# Patient Record
Sex: Male | Born: 1980 | Race: White | Hispanic: No | Smoking: Never smoker
Health system: Southern US, Community
[De-identification: ages and names within clinical notes are randomized; demographics above are authoritative.]

## PROBLEM LIST (undated history)

## (undated) DIAGNOSIS — I10 Essential (primary) hypertension: Secondary | ICD-10-CM

## (undated) DIAGNOSIS — D689 Coagulation defect, unspecified: Secondary | ICD-10-CM

## (undated) DIAGNOSIS — E785 Hyperlipidemia, unspecified: Secondary | ICD-10-CM

## (undated) DIAGNOSIS — F419 Anxiety disorder, unspecified: Secondary | ICD-10-CM

## (undated) DIAGNOSIS — F32A Depression, unspecified: Secondary | ICD-10-CM

## (undated) DIAGNOSIS — R188 Other ascites: Secondary | ICD-10-CM

## (undated) DIAGNOSIS — F1910 Other psychoactive substance abuse, uncomplicated: Secondary | ICD-10-CM

## (undated) HISTORY — DX: Hyperlipidemia, unspecified: E78.5

## (undated) HISTORY — DX: Coagulation defect, unspecified: D68.9

## (undated) HISTORY — DX: Depression, unspecified: F32.A

## (undated) HISTORY — DX: Anxiety disorder, unspecified: F41.9

## (undated) HISTORY — PX: KNEE SURGERY: SHX244

## (undated) HISTORY — DX: Other psychoactive substance abuse, uncomplicated: F19.10

---
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
## 2005-08-19 ENCOUNTER — Emergency Department (HOSPITAL_COMMUNITY): Admission: EM | Admit: 2005-08-19 | Discharge: 2005-08-19 | Payer: Self-pay | Admitting: Emergency Medicine

## 2012-12-04 DIAGNOSIS — E785 Hyperlipidemia, unspecified: Secondary | ICD-10-CM | POA: Insufficient documentation

## 2012-12-04 HISTORY — DX: Hyperlipidemia, unspecified: E78.5

## 2013-08-31 ENCOUNTER — Emergency Department (INDEPENDENT_AMBULATORY_CARE_PROVIDER_SITE_OTHER): Payer: BC Managed Care – PPO

## 2013-08-31 ENCOUNTER — Encounter (HOSPITAL_COMMUNITY): Payer: Self-pay | Admitting: Emergency Medicine

## 2013-08-31 ENCOUNTER — Emergency Department (HOSPITAL_COMMUNITY)
Admission: EM | Admit: 2013-08-31 | Discharge: 2013-08-31 | Disposition: A | Discharge status: Home / Self Care | Payer: BC Managed Care – PPO | Source: Home / Self Care

## 2013-08-31 DIAGNOSIS — S46911A Strain of unspecified muscle, fascia and tendon at shoulder and upper arm level, right arm, initial encounter: Secondary | ICD-10-CM

## 2013-08-31 DIAGNOSIS — IMO0002 Reserved for concepts with insufficient information to code with codable children: Secondary | ICD-10-CM

## 2013-08-31 DIAGNOSIS — S86911A Strain of unspecified muscle(s) and tendon(s) at lower leg level, right leg, initial encounter: Secondary | ICD-10-CM

## 2013-08-31 MED ORDER — ONDANSETRON 4 MG PO TBDP
ORAL_TABLET | ORAL | Status: AC
  Filled 2013-08-31: qty 2

## 2013-08-31 MED ORDER — TETANUS-DIPHTH-ACELL PERTUSSIS 5-2.5-18.5 LF-MCG/0.5 IM SUSP
0.5000 mL | Freq: Once | INTRAMUSCULAR | Status: AC
  Administered 2013-08-31: 0.5 mL via INTRAMUSCULAR

## 2013-08-31 MED ORDER — HYDROCODONE-ACETAMINOPHEN 5-325 MG PO TABS: 1.0000 | ORAL_TABLET | Freq: Four times a day (QID) | ORAL | Status: DC | PRN

## 2013-08-31 MED ORDER — HYDROMORPHONE HCL 1 MG/ML IJ SOLN
2.0000 mg | Freq: Once | INTRAMUSCULAR | Status: AC
  Administered 2013-08-31: 2 mg via INTRAMUSCULAR

## 2013-08-31 MED ORDER — HYDROMORPHONE HCL PF 1 MG/ML IJ SOLN
INTRAMUSCULAR | Status: AC
  Filled 2013-08-31: qty 2

## 2013-08-31 MED ORDER — TETANUS-DIPHTH-ACELL PERTUSSIS 5-2.5-18.5 LF-MCG/0.5 IM SUSP
INTRAMUSCULAR | Status: AC
  Filled 2013-08-31: qty 0.5

## 2013-08-31 MED ORDER — ONDANSETRON 4 MG PO TBDP
8.0000 mg | ORAL_TABLET | Freq: Once | ORAL | Status: AC
  Administered 2013-08-31: 8 mg via ORAL

## 2013-08-31 NOTE — Discharge Instructions (Signed)
Wear splints for comfort as needed, use medicine as needed, see dr Thurston Holewainer on mon at 2pm for recheck.

## 2013-08-31 NOTE — ED Notes (Signed)
Pt fell  Off  The  Top   Of an  8  Foot  Ladder         He  Has  Pain in the  r  Knee  And  r    Shoulder              Has  A  History     Of  Knee  Surgery          In past        rom  Is  Limited  In  Both  Of the  Areas            He  Hid  Not  Hit  His  Head  He  Did  not  Pass  Out

## 2013-08-31 NOTE — ED Provider Notes (Signed)
CSN: [id number]     Arrival date & time 08/31/13  1233 History   None    Chief Complaint  Patient presents with  . Fall   (Consider location/radiation/quality/duration/timing/severity/associated sxs/prior Treatment) Patient is a 33 y.o. male presenting with fall. The history is provided by the patient.  Fall This is a new problem. The current episode started 1 to 2 hours ago (fell approx 8 ft from ladder landing on right leg twisting knee then falling onto right shoulder. no loc , no other injuries.). The problem has not changed since onset.Pertinent negatives include no headaches.    History reviewed. No pertinent past medical history. Past Surgical History  Procedure Laterality Date  . Knee surgery     History reviewed. No pertinent family history. History  Substance Use Topics  . Smoking status: Never Smoker   . Smokeless tobacco: Not on file  . Alcohol Use: Yes    Review of Systems  Constitutional: Negative.   Musculoskeletal: Positive for gait problem and joint swelling. Negative for back pain and neck pain.  Skin: Negative.   Neurological: Negative for headaches.    Allergies  Review of patient's allergies indicates not on file.  Home Medications   Prior to Admission medications   Medication Sig Start Date End Date Taking? Authorizing Provider  HYDROcodone-acetaminophen (NORCO/VICODIN) 5-325 MG per tablet Take 1-2 tablets by mouth every 6 (six) hours as needed for moderate pain or severe pain. 08/31/13   Linna HoffJames D Maritta Kief, MD   BP 157/97  Pulse 105  Temp(Src) 98.9 F (37.2 C) (Oral)  Resp 16  SpO2 96% Physical Exam  Nursing note and vitals reviewed. Constitutional: He is oriented to person, place, and time. He appears well-developed and well-nourished.  HENT:  Head: Normocephalic and atraumatic.  Neck: Normal range of motion. Neck supple.  Pulmonary/Chest: He exhibits no tenderness.  Abdominal: There is no tenderness.  Musculoskeletal: He exhibits tenderness.         Right shoulder: He exhibits tenderness, pain and decreased strength. He exhibits no effusion, no crepitus, no deformity and normal pulse.       Right knee: He exhibits decreased range of motion, effusion and bony tenderness. He exhibits no ecchymosis, normal alignment and normal patellar mobility. Tenderness found. Lateral joint line tenderness noted. No patellar tendon tenderness noted.       Legs: Neurological: He is alert and oriented to person, place, and time.  Skin: Skin is warm and dry.    ED Course  Procedures (including critical care time) Labs Review Labs Reviewed - No data to display  Imaging Review Dg Shoulder Right  08/31/2013   CLINICAL DATA:  Recent traumatic injury with pain  EXAM: RIGHT SHOULDER - 2+ VIEW  COMPARISON:  None.  FINDINGS: There is no evidence of fracture or dislocation. There is no evidence of arthropathy or other focal bone abnormality. Soft tissues are unremarkable.  IMPRESSION: No acute abnormality noted.   Electronically Signed   By: Alcide CleverMark  Lukens M.D.   On: 08/31/2013 14:33   Dg Knee Complete 4 Views Right  08/31/2013   CLINICAL DATA:  Larey SeatFell off ladder striking RIGHT shoulder in landing on RIGHT foot, twisting RIGHT knee, extreme pain RIGHT knee, unable to bear weight, prior injury and surgery  EXAM: RIGHT KNEE - COMPLETE 4+ VIEW  COMPARISON:  None  FINDINGS: Osseous mineralization normal.  Postsurgical changes identified from prior ACL reconstruction.  Minimal joint space narrowing at lateral compartment.  No acute fracture, dislocation, or bone destruction.  No knee joint effusion.  IMPRESSION: No acute osseous abnormalities.  Postsurgical and mild degenerative changes RIGHT knee.   Electronically Signed   By: Ulyses Southward M.D.   On: 08/31/2013 14:54     MDM   1. Strain of right knee and leg, initial encounter   2. Strain of right shoulder, initial encounter        Linna Hoff, MD 08/31/13 [phone]

## 2015-01-17 ENCOUNTER — Emergency Department (HOSPITAL_COMMUNITY)
Admission: EM | Admit: 2015-01-17 | Discharge: 2015-01-17 | Disposition: A | Discharge status: Home / Self Care | Payer: BLUE CROSS/BLUE SHIELD | Source: Home / Self Care | Attending: Family Medicine | Admitting: Family Medicine

## 2015-01-17 ENCOUNTER — Encounter (HOSPITAL_COMMUNITY): Payer: Self-pay | Admitting: Emergency Medicine

## 2015-01-17 DIAGNOSIS — J069 Acute upper respiratory infection, unspecified: Secondary | ICD-10-CM

## 2015-01-17 DIAGNOSIS — J3489 Other specified disorders of nose and nasal sinuses: Secondary | ICD-10-CM

## 2015-01-17 DIAGNOSIS — R05 Cough: Secondary | ICD-10-CM | POA: Diagnosis not present

## 2015-01-17 DIAGNOSIS — R059 Cough, unspecified: Secondary | ICD-10-CM

## 2015-01-17 MED ORDER — IPRATROPIUM BROMIDE 0.06 % NA SOLN: 2.0000 | Freq: Four times a day (QID) | NASAL | Status: DC

## 2015-01-17 NOTE — Discharge Instructions (Signed)
Upper Respiratory Infection, Adult For nasal congestion take Sudafed PE 10 mg every 4 hours as needed For drainage recommend Allegra 180 mg a day or Zyrtec 10 mg or Claritin 10 mg a day. For cough Robitussin-DM as directed Use a copious amount of saline nasal spray frequently Ibuprofen 600 mg every 6-8 hours as needed for discomfort or fever. Atrovent nasal spray as directed for runny nose. Most upper respiratory infections (URIs) are a viral infection of the air passages leading to the lungs. A URI affects the nose, throat, and upper air passages. The most common type of URI is nasopharyngitis and is typically referred to as "the common cold." URIs run their course and usually go away on their own. Most of the time, a URI does not require medical attention, but sometimes a bacterial infection in the upper airways can follow a viral infection. This is called a secondary infection. Sinus and middle ear infections are common types of secondary upper respiratory infections. Bacterial pneumonia can also complicate a URI. A URI can worsen asthma and chronic obstructive pulmonary disease (COPD). Sometimes, these complications can require emergency medical care and may be life threatening.  CAUSES Almost all URIs are caused by viruses. A virus is a type of germ and can spread from one person to another.  RISKS FACTORS You may be at risk for a URI if:   You smoke.   You have chronic heart or lung disease.  You have a weakened defense (immune) system.   You are very young or very old.   You have nasal allergies or asthma.  You work in crowded or poorly ventilated areas.  You work in health care facilities or schools. SIGNS AND SYMPTOMS  Symptoms typically develop 2-3 days after you come in contact with a cold virus. Most viral URIs last 7-10 days. However, viral URIs from the influenza virus (flu virus) can last 14-18 days and are typically more severe. Symptoms may include:   Runny or  stuffy (congested) nose.   Sneezing.   Cough.   Sore throat.   Headache.   Fatigue.   Fever.   Loss of appetite.   Pain in your forehead, behind your eyes, and over your cheekbones (sinus pain).  Muscle aches.  DIAGNOSIS  Your health care provider may diagnose a URI by:  Physical exam.  Tests to check that your symptoms are not due to another condition such as:  Strep throat.  Sinusitis.  Pneumonia.  Asthma. TREATMENT  A URI goes away on its own with time. It cannot be cured with medicines, but medicines may be prescribed or recommended to relieve symptoms. Medicines may help:  Reduce your fever.  Reduce your cough.  Relieve nasal congestion. HOME CARE INSTRUCTIONS   Take medicines only as directed by your health care provider.   Gargle warm saltwater or take cough drops to comfort your throat as directed by your health care provider.  Use a warm mist humidifier or inhale steam from a shower to increase air moisture. This may make it easier to breathe.  Drink enough fluid to keep your urine clear or pale yellow.   Eat soups and other clear broths and maintain good nutrition.   Rest as needed.   Return to work when your temperature has returned to normal or as your health care provider advises. You may need to stay home longer to avoid infecting others. You can also use a face mask and careful hand washing to prevent spread of the  virus.  Increase the usage of your inhaler if you have asthma.   Do not use any tobacco products, including cigarettes, chewing tobacco, or electronic cigarettes. If you need help quitting, ask your health care provider. PREVENTION  The best way to protect yourself from getting a cold is to practice good hygiene.   Avoid oral or hand contact with people with cold symptoms.   Wash your hands often if contact occurs.  There is no clear evidence that vitamin C, vitamin E, echinacea, or exercise reduces the chance  of developing a cold. However, it is always recommended to get plenty of rest, exercise, and practice good nutrition.  SEEK MEDICAL CARE IF:   You are getting worse rather than better.   Your symptoms are not controlled by medicine.   You have chills.  You have worsening shortness of breath.  You have brown or red mucus.  You have yellow or brown nasal discharge.  You have pain in your face, especially when you bend forward.  You have a fever.  You have swollen neck glands.  You have pain while swallowing.  You have white areas in the back of your throat. SEEK IMMEDIATE MEDICAL CARE IF:   You have severe or persistent:  Headache.  Ear pain.  Sinus pain.  Chest pain.  You have chronic lung disease and any of the following:  Wheezing.  Prolonged cough.  Coughing up blood.  A change in your usual mucus.  You have a stiff neck.  You have changes in your:  Vision.  Hearing.  Thinking.  Mood. MAKE SURE YOU:   Understand these instructions.  Will watch your condition.  Will get help right away if you are not doing well or get worse.   This information is not intended to replace advice given to you by your health care provider. Make sure you discuss any questions you have with your health care provider.   Document Released: 07/14/2000 Document Revised: 06/04/2014 Document Reviewed: 04/25/2013 Elsevier Interactive Patient Education [employer]2016 Elsevier Inc.

## 2015-01-17 NOTE — ED Provider Notes (Signed)
CSN: [id number]     Arrival date & time 01/17/15  1310 History   First MD Initiated Contact with Patient 01/17/15 1342     Chief Complaint  Patient presents with  . URI   (Consider location/radiation/quality/duration/timing/severity/associated sxs/prior Treatment) HPI Comments: 34 year old male complaining of URI symptoms. He is complaining of a cough, sinus drainage, runny nose, nasal congestion, morning "goopy eyes". Denies fever or PND. Denies shortness of breath. When coughing has facial pressure.  Patient is a 34 y.o. male presenting with URI.  URI Presenting symptoms: congestion, cough and rhinorrhea   Presenting symptoms: no ear pain, no fever and no sore throat   Severity:  Moderate Onset quality:  Gradual Duration:  10 days Timing:  Constant Progression:  Unchanged Chronicity:  New Relieved by:  Nothing Associated symptoms: headaches, sinus pain and sneezing   Associated symptoms: no swollen glands and no wheezing     History reviewed. No pertinent past medical history. Past Surgical History  Procedure Laterality Date  . Knee surgery     No family history on file. Social History  Substance Use Topics  . Smoking status: Never Smoker   . Smokeless tobacco: None  . Alcohol Use: Yes    Review of Systems  Constitutional: Positive for activity change. Negative for fever and chills.  HENT: Positive for congestion, ear discharge, rhinorrhea, sinus pressure and sneezing. Negative for ear pain, postnasal drip and sore throat.   Eyes: Positive for discharge.  Respiratory: Positive for cough. Negative for shortness of breath and wheezing.   Cardiovascular: Negative.   Skin: Negative.   Neurological: Positive for headaches.  All other systems reviewed and are negative.   Allergies  Review of patient's allergies indicates no known allergies.  Home Medications   Prior to Admission medications   Medication Sig Start Date End Date Taking? Authorizing Provider   HYDROcodone-acetaminophen (NORCO/VICODIN) 5-325 MG per tablet Take 1-2 tablets by mouth every 6 (six) hours as needed for moderate pain or severe pain. 08/31/13   Linna HoffJames D Kindl, MD  ipratropium (ATROVENT) 0.06 % nasal spray Place 2 sprays into both nostrils 4 (four) times daily. 01/17/15   Hayden Rasmussenavid Trey Gulbranson, NP   Meds Ordered and Administered this Visit  Medications - No data to display  BP 151/98 mmHg  Pulse 102  Temp(Src) 98.6 F (37 C) (Oral) No data found.   Physical Exam  Constitutional: He is oriented to person, place, and time. He appears well-developed and well-nourished. No distress.  HENT:  Mouth/Throat: No oropharyngeal exudate.  Bilateral TMs are retracted. No erythema. Oropharynx erythematous without swelling or exudates. Positive for cobblestoning and clear PND.  Eyes: Conjunctivae and EOM are normal.  Neck: Normal range of motion. Neck supple.  Cardiovascular: Normal rate, regular rhythm and normal heart sounds.   Pulmonary/Chest: Effort normal and breath sounds normal. No respiratory distress. He has no wheezes. He has no rales.  Musculoskeletal: Normal range of motion. He exhibits no edema.  Lymphadenopathy:    He has no cervical adenopathy.  Neurological: He is alert and oriented to person, place, and time.  Skin: Skin is warm and dry. No rash noted.  Psychiatric: He has a normal mood and affect.  Nursing note and vitals reviewed.   ED Course  Procedures (including critical care time)  Labs Review Labs Reviewed - No data to display  Imaging Review No results found.   Visual Acuity Review  Right Eye Distance:   Left Eye Distance:   Bilateral Distance:  Right Eye Near:   Left Eye Near:    Bilateral Near:         MDM   1. URI (upper respiratory infection)   2. Sinus drainage   3. Cough    Upper Respiratory Infection, Adult For nasal congestion take Sudafed PE 10 mg every 4 hours as needed For drainage recommend Allegra 180 mg a day or Zyrtec  10 mg or Claritin 10 mg a day. For cough Robitussin-DM as directed Use a copious amount of saline nasal spray frequently Ibuprofen 600 mg every 6-8 hours as needed for discomfort or fever. Atrovent nasal spray as directed for runny nose.    Hayden Rasmussen, NP 01/17/15 [phone]

## 2015-01-17 NOTE — ED Notes (Signed)
C/o cold sx onset 10 days associated w/dry cough, congestion, PND, ST, facial pressure Denies fevers, chills, SOB, wheezing A&O x4... No acute distress.

## 2015-08-11 ENCOUNTER — Telehealth: Payer: Self-pay

## 2015-08-11 NOTE — Telephone Encounter (Signed)
Previsit call made to patient. Chart updated.

## 2015-08-12 ENCOUNTER — Ambulatory Visit (INDEPENDENT_AMBULATORY_CARE_PROVIDER_SITE_OTHER): Payer: 59 | Admitting: Medical

## 2015-08-12 ENCOUNTER — Telehealth: Payer: Self-pay | Admitting: Medical

## 2015-08-12 ENCOUNTER — Encounter: Payer: Self-pay | Admitting: Medical

## 2015-08-12 ENCOUNTER — Ambulatory Visit (HOSPITAL_BASED_OUTPATIENT_CLINIC_OR_DEPARTMENT_OTHER)
Admission: RE | Admit: 2015-08-12 | Discharge: 2015-08-12 | Disposition: A | Discharge status: Home / Self Care | Payer: 59 | Source: Ambulatory Visit | Attending: Medical | Admitting: Medical

## 2015-08-12 VITALS — BP 120/80 | HR 78 | Temp 98.1°F | Resp 16 | Ht 70.25 in | Wt 222.4 lb

## 2015-08-12 DIAGNOSIS — M25512 Pain in left shoulder: Secondary | ICD-10-CM | POA: Insufficient documentation

## 2015-08-12 DIAGNOSIS — G4700 Insomnia, unspecified: Secondary | ICD-10-CM

## 2015-08-12 DIAGNOSIS — G5603 Carpal tunnel syndrome, bilateral upper limbs: Secondary | ICD-10-CM | POA: Diagnosis not present

## 2015-08-12 DIAGNOSIS — R351 Nocturia: Secondary | ICD-10-CM

## 2015-08-12 LAB — COMPREHENSIVE METABOLIC PANEL
ALBUMIN: 4.3 g/dL (ref 3.5–5.2)
ALK PHOS: 86 U/L (ref 39–117)
ALT: 63 U/L — ABNORMAL HIGH (ref 0–53)
AST: 49 U/L — ABNORMAL HIGH (ref 0–37)
BILIRUBIN TOTAL: 1 mg/dL (ref 0.2–1.2)
BUN: 13 mg/dL (ref 6–23)
CO2: 29 mEq/L (ref 19–32)
Calcium: 9.6 mg/dL (ref 8.4–10.5)
Chloride: 102 mEq/L (ref 96–112)
Creatinine, Ser: 0.9 mg/dL (ref 0.40–1.50)
GFR: 102.33 mL/min (ref >60.00)
GLUCOSE: 92 mg/dL (ref 70–99)
POTASSIUM: 4.4 meq/L (ref 3.5–5.1)
Sodium: 139 mEq/L (ref 135–145)
Total Protein: 7.6 g/dL (ref 6.0–8.3)

## 2015-08-12 LAB — POC URINALSYSI DIPSTICK (AUTOMATED)
Bilirubin, UA: NEGATIVE
GLUCOSE UA: NEGATIVE
Ketones, UA: NEGATIVE
Leukocytes, UA: NEGATIVE
NITRITE UA: NEGATIVE
PROTEIN UA: NEGATIVE
RBC UA: NEGATIVE
SPEC GRAV UA: 1.02
UROBILINOGEN UA: 0.2
pH, UA: 6.5

## 2015-08-12 LAB — PSA: PSA: 0.63 ng/mL (ref 0.10–4.00)

## 2015-08-12 MED ORDER — ZOLPIDEM TARTRATE 5 MG PO TABS: 5.0000 mg | ORAL_TABLET | Freq: Every evening | ORAL | Status: DC | PRN

## 2015-08-12 MED ORDER — DICLOFENAC SODIUM 75 MG PO TBEC: 75.0000 mg | DELAYED_RELEASE_TABLET | Freq: Two times a day (BID) | ORAL | Status: DC

## 2015-08-12 NOTE — Addendum Note (Signed)
Addended by: Neldon LabellaMABE, HOLDEN S on: 08/12/2015 01:07 PM   Modules accepted: Orders

## 2015-08-12 NOTE — Progress Notes (Signed)
Pre visit review using our clinic review tool, if applicable. No additional management support is needed unless otherwise documented below in the visit note. 

## 2015-08-12 NOTE — Telephone Encounter (Signed)
Will you add ua poct to his chart. And result.

## 2015-08-12 NOTE — Progress Notes (Signed)
Subjective:    Patient ID: Robert Sellers, male    DOB: 07/30/1980, 35 y.o.   MRN: [id number]  HPI  I have reviewed pt PMH, PSH, FH, Social History and Surgical History  Pt in states he feels overall well today.  Pt unemployed was working for [employer]. 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 Active recently. Moderate healthy diet. Some fruits and vegetable. Married- 1 child 18 months.   Pt reports some trouble sleeping for past few months. Some problems falling asleep and staying asleep. Maybe related to unemployment. In past states usually good sleeper. Pt thinks mild snorer.   Frequent urination during the night. Occuring for past 6 months. He estimates 2-3 times at night. Drinks glass of water before bed. No polyphagia or polydypsia.  Pt states at times when he is able to sleep sometimes he wakes up he will have numbness to both his hands. This has been going on for 6 months. Pt has been doing consrtuction for about 4 months.  Lt shoulder pain for about 4 months. No fall or injury. Morning pain often noticeable. When he lifts daughter up will have pain. Also rom causes pain. No associated chest pain.   Review of Systems  Constitutional: Negative for fever, chills and fatigue.  Cardiovascular: Negative for chest pain and palpitations.  Gastrointestinal: Negative for nausea, abdominal pain and diarrhea.  Genitourinary: Positive for frequency. Negative for urgency, hematuria, penile swelling, difficulty urinating, penile pain and testicular pain.  Musculoskeletal:       See hpi shoulder pain and cts symptoms.  Psychiatric/Behavioral: Positive for sleep disturbance. Negative for suicidal ideas, hallucinations, behavioral problems and confusion. The patient is not nervous/anxious.        Possible stress from loss of job.    History reviewed. No pertinent past medical history.   Social History   Social History  . Marital Status: Single    Spouse Name: N/A  .  Number of Children: N/A  . Years of Education: N/A   Occupational History  . Not on file.   Social History Main Topics  . Smoking status: Never Smoker   . Smokeless tobacco: Not on file  . Alcohol Use: Yes     Comment: 2-3 beers twice a week.  . Drug Use: No  . Sexual Activity: Yes   Other Topics Concern  . Not on file   Social History Narrative    Past Surgical History  Procedure Laterality Date  . Knee surgery      Family History  Problem Relation Age of Onset  . Heart disease Mother   . Hyperlipidemia Mother   . Hypertension Mother   . Diabetes Mother   . Heart disease Father   . Hyperlipidemia Father   . Hypertension Father   . Diabetes Father     No Known Allergies  Current Outpatient Prescriptions on File Prior to Visit  Medication Sig Dispense Refill  . Multiple Vitamin (MULTIVITAMIN) tablet Take 1 tablet by mouth daily.     No current facility-administered medications on file prior to visit.    BP 120/80 mmHg  Pulse 78  Temp(Src) 98.1 F (36.7 C) (Oral)  Resp 16  Ht 5' 10.25" (1.784 m)  Wt 222 lb 6.4 oz (100.88 kg)  BMI 31.70 kg/m2  SpO2 98%       Objective:   Physical Exam  General Mental Status- Alert. General Appearance- Not in acute distress.   Skin General: Color- Normal Color. Moisture-  Normal Moisture.  Neck Carotid Arteries- Normal color. Moisture- Normal Moisture. No carotid bruits. No JVD.  Chest and Lung Exam Auscultation: Breath Sounds:-Normal.  Cardiovascular Auscultation:Rythm- Regular. Murmurs & Other Heart Sounds:Auscultation of the heart reveals- No Murmurs.  Abdomen Inspection:-Inspeection Normal. Palpation/Percussion:Note:No mass. Palpation and Percussion of the abdomen reveal- Non Tender, Non Distended + BS, no rebound or guarding.    Neurologic Cranial Nerve exam:- CN III-XII intact(No nystagmus), symmetric smile. Strength:- 5/5 equal and symmetric strength both upper and lower  extremities.  Bilateral upper ext- + phalen sign left side. Neg phalen sign left side.  Left shoulder- anterior aspect shoulder pain on palpation and on abduction and rom.      Assessment & Plan:  For your left shoulder pain will get xray today and refer you to sports medicine. Diclofenac rx as well.  You have cts.Will rx diclofenac. Your current occupation may play a role. You might want to get wrist cock up splints and use when you get home after work and at night.  For frequent urination will get cmp to check sugar levels and get ua today. Would reduce night time fluid intake. Last drink of any sort at 7:30-8 pm.  For insomnia rx ambien 5 mg. Hopefully short term use only. Rx advisement. I don't think you have indication of sleep apnea. But will watch your mild snoring pattern. If nightly and worse may need sleep study.  Follow up 1-2 months CPE or as needed  Erasto Sleight, Ramon DredgeEdward, VF CorporationPA-C

## 2015-08-12 NOTE — Patient Instructions (Signed)
For your left shoulder pain will get xray today and refer you to sports medicine. Diclofenac rx as well.  You have cts.Will rx diclofenac. Your current occupation may play a role. You might want to get wrist cock up splints and use when you get home after work and at night.  For frequent urination will get cmp to check sugar levels and get ua today. Would reduce night time fluid intake. Last drink of any sort at 7:30-8 pm.  For insomnia rx ambien 5 mg. Hopefully short term use only. Rx advisement. I don't think you have indication of sleep apnea. But will watch your mild snoring pattern. If nightly and worse may need sleep study.  Follow up 1-2 months CPE or as needed

## 2015-08-14 ENCOUNTER — Ambulatory Visit: Payer: 59 | Admitting: Family Medicine

## 2015-08-14 LAB — URINE CULTURE
COLONY COUNT: NO GROWTH
ORGANISM ID, BACTERIA: NO GROWTH

## 2015-08-26 ENCOUNTER — Encounter: Payer: Self-pay | Admitting: Family Medicine

## 2015-08-26 ENCOUNTER — Ambulatory Visit (INDEPENDENT_AMBULATORY_CARE_PROVIDER_SITE_OTHER): Payer: 59 | Admitting: Family Medicine

## 2015-08-26 DIAGNOSIS — M25512 Pain in left shoulder: Secondary | ICD-10-CM

## 2015-08-26 MED ORDER — NITROGLYCERIN 0.2 MG/HR TD PT24: MEDICATED_PATCH | TRANSDERMAL | 1 refills | Status: DC

## 2015-08-26 NOTE — Patient Instructions (Signed)
You have biceps tendinitis/tendinopathy Start home exercises (especially arm curls, hammer rotation 3 sets of 10 once a day with light weights) Consider physical therapy. Avoid painful activities except with home exercise program Ice area 15 minutes at a time 3-4 times a day Continue the meloxicam - generally we recommend taking this for 7-10 days regularly then as needed. Nitro patches 1/4th patch over affected area, change daily. Follow up with me in 6 weeks. Generally would consider physical therapy as next step if not improving as expected.

## 2015-08-28 ENCOUNTER — Encounter: Payer: Self-pay | Admitting: Family Medicine

## 2015-08-28 DIAGNOSIS — M25512 Pain in left shoulder: Secondary | ICD-10-CM

## 2015-08-28 HISTORY — DX: Pain in left shoulder: M25.512

## 2015-08-28 NOTE — Assessment & Plan Note (Signed)
2/2 biceps tendinitis.  Shown home exercises to do daily.  Icing, continue with meloxicam.  Start nitro patches (discussed risks of headache, skin irritation).  F/u in 6 weeks.  Consider PT if not improving.

## 2015-08-28 NOTE — Progress Notes (Signed)
PCP and consultation requested by: Esperanza Richters, PA-C  Subjective:   HPI: Patient is a 35 y.o. male here for left shoulder pain.  Patient reports for about 3 months he has had off and on left anterior shoulder pain. No acute injury or trauma. Pain is 0/10 but can be 6/10 at times especially in the morning, sharp Better with rest. Has tried meloxicam which has helped some. No skin changes, numbness.  Past Medical History:  Diagnosis Date  . Hyperlipidemia 12/04/2012    Current Outpatient Prescriptions on File Prior to Visit  Medication Sig Dispense Refill  . diclofenac (VOLTAREN) 75 MG EC tablet Take 1 tablet (75 mg total) by mouth 2 (two) times daily. 30 tablet 0  . Multiple Vitamin (MULTIVITAMIN) tablet Take 1 tablet by mouth daily.    Marland Kitchen zolpidem (AMBIEN) 5 MG tablet Take 1 tablet (5 mg total) by mouth at bedtime as needed for sleep. 14 tablet 0   No current facility-administered medications on file prior to visit.     Past Surgical History:  Procedure Laterality Date  . KNEE SURGERY      No Known Allergies  Social History   Social History  . Marital status: Single    Spouse name: N/A  . Number of children: N/A  . Years of education: N/A   Occupational History  . Not on file.   Social History Main Topics  . Smoking status: Never Smoker  . Smokeless tobacco: Not on file  . Alcohol use Yes     Comment: 2-3 beers twice a week.  . Drug use: No  . Sexual activity: Yes   Other Topics Concern  . Not on file   Social History Narrative  . No narrative on file    Family History  Problem Relation Age of Onset  . Heart disease Mother   . Hyperlipidemia Mother   . Hypertension Mother   . Diabetes Mother   . Heart disease Father   . Hyperlipidemia Father   . Hypertension Father   . Diabetes Father     BP 128/85   Pulse (!) 55   Ht 5\' 10"  (1.778 m)   Wt 218 lb (98.9 kg)   BMI 31.28 kg/m   Review of Systems: See HPI above.    Objective:  Physical  Exam:  Gen: NAD, comfortable in exam room  Left shoulder: No swelling, ecchymoses.  No gross deformity. TTP anterior over biceps tendon.  No AC or other tenderness. FROM with negative painful arc. Negative Hawkins, Neers. Positive Speeds, Yergasons. Strength 5/5 with empty can and resisted internal/external rotation. Negative apprehension. NV intact distally.    Assessment & Plan:  1. Left shoulder pain - 2/2 biceps tendinitis.  Shown home exercises to do daily.  Icing, continue with meloxicam.  Start nitro patches (discussed risks of headache, skin irritation).  F/u in 6 weeks.  Consider PT if not improving.

## 2015-09-09 ENCOUNTER — Encounter: Payer: Self-pay | Admitting: Medical

## 2015-09-09 ENCOUNTER — Other Ambulatory Visit: Payer: Self-pay | Admitting: Medical

## 2015-09-09 DIAGNOSIS — M25512 Pain in left shoulder: Secondary | ICD-10-CM

## 2015-09-12 ENCOUNTER — Ambulatory Visit (INDEPENDENT_AMBULATORY_CARE_PROVIDER_SITE_OTHER): Payer: 59 | Admitting: Medical

## 2015-09-12 ENCOUNTER — Encounter: Payer: Self-pay | Admitting: Medical

## 2015-09-12 VITALS — BP 135/80 | HR 73 | Temp 98.0°F | Ht 70.0 in | Wt 218.2 lb

## 2015-09-12 DIAGNOSIS — L989 Disorder of the skin and subcutaneous tissue, unspecified: Secondary | ICD-10-CM

## 2015-09-12 DIAGNOSIS — Z0000 Encounter for general adult medical examination without abnormal findings: Secondary | ICD-10-CM

## 2015-09-12 LAB — LIPID PANEL
CHOLESTEROL: 184 mg/dL (ref 0–200)
HDL: 48.6 mg/dL (ref >39.00)
LDL Cholesterol: 103 mg/dL — ABNORMAL HIGH (ref 0–99)
NonHDL: 135.45
Total CHOL/HDL Ratio: 4
Triglycerides: 160 mg/dL — ABNORMAL HIGH (ref 0.0–149.0)
VLDL: 32 mg/dL (ref 0.0–40.0)

## 2015-09-12 LAB — CBC WITH DIFFERENTIAL/PLATELET
BASOS PCT: 0.6 % (ref 0.0–3.0)
Basophils Absolute: 0 10*3/uL (ref 0.0–0.1)
EOS ABS: 0.3 10*3/uL (ref 0.0–0.7)
EOS PCT: 4.2 % (ref 0.0–5.0)
HEMATOCRIT: 42.3 % (ref 39.0–52.0)
HEMOGLOBIN: 14.7 g/dL (ref 13.0–17.0)
LYMPHS PCT: 34.7 % (ref 12.0–46.0)
Lymphs Abs: 2.7 10*3/uL (ref 0.7–4.0)
MCHC: 34.7 g/dL (ref 30.0–36.0)
MCV: 93.6 fl (ref 78.0–100.0)
MONOS PCT: 5.6 % (ref 3.0–12.0)
Monocytes Absolute: 0.4 10*3/uL (ref 0.1–1.0)
Neutro Abs: 4.2 10*3/uL (ref 1.4–7.7)
Neutrophils Relative %: 54.9 % (ref 43.0–77.0)
Platelets: 283 10*3/uL (ref 150.0–400.0)
RBC: 4.52 Mil/uL (ref 4.22–5.81)
RDW: 13.4 % (ref 11.5–15.5)
WBC: 7.7 10*3/uL (ref 4.0–10.5)

## 2015-09-12 LAB — COMPREHENSIVE METABOLIC PANEL
ALT: 97 U/L — AB (ref 0–53)
AST: 62 U/L — AB (ref 0–37)
Albumin: 4.4 g/dL (ref 3.5–5.2)
Alkaline Phosphatase: 85 U/L (ref 39–117)
BILIRUBIN TOTAL: 0.6 mg/dL (ref 0.2–1.2)
BUN: 13 mg/dL (ref 6–23)
CALCIUM: 9.4 mg/dL (ref 8.4–10.5)
CHLORIDE: 105 meq/L (ref 96–112)
CO2: 29 mEq/L (ref 19–32)
CREATININE: 0.97 mg/dL (ref 0.40–1.50)
GFR: 93.81 mL/min (ref >60.00)
Glucose, Bld: 85 mg/dL (ref 70–99)
Potassium: 4.5 mEq/L (ref 3.5–5.1)
Sodium: 139 mEq/L (ref 135–145)
Total Protein: 7.7 g/dL (ref 6.0–8.3)

## 2015-09-12 LAB — TSH: TSH: 0.98 u[IU]/mL (ref 0.35–4.50)

## 2015-09-12 MED ORDER — TRAZODONE HCL 50 MG PO TABS: 25.0000 mg | ORAL_TABLET | Freq: Every evening | ORAL | 3 refills | Status: DC | PRN

## 2015-09-12 NOTE — Progress Notes (Signed)
Subjective:    Patient ID: Robert Sellers, male    DOB: 1980-11-07, 35 y.o.   MRN: [id number]  HPI  Pt in for physical. He is fasting.  Pt is working [employer]. Pt states eating fairly healthy. Married- 1 child. Very little caffeinated beverages. About one every 2-3 days.  On last visit had some left shoulder pain. Seemed to get better. Then came back. He did see sports med. Has follow up sooner per his report.  Pt does take flu vaccine. Will get later Sept or October.  HIV screening today. Pt ok with this.  No family history of skin cancer.  Pt mentioned [place] with [place] he did not sleep that well. He tried his wife trazadone and it helped much better.    Review of Systems  Constitutional: Negative for chills.  HENT: Negative for congestion and postnasal drip.   Respiratory: Negative for cough, shortness of breath and wheezing.   Cardiovascular: Negative for chest pain and palpitations.  Genitourinary: Negative for dysuria, flank pain and frequency.  Musculoskeletal: Negative for back pain.  Skin: Negative for rash.       No complaints but one small faint mole slight varition in color. One portion darker than rest.  Neurological: Negative for dizziness, speech difficulty, weakness and numbness.  Hematological: Negative for adenopathy. Does not bruise/bleed easily.  Psychiatric/Behavioral: Negative for behavioral problems and decreased concentration. The patient is not nervous/anxious.     Past Medical History:  Diagnosis Date  . Hyperlipidemia 12/04/2012     Social History   Social History  . Marital status: Single    Spouse name: N/A  . Number of children: N/A  . Years of education: N/A   Occupational History  . Not on file.   Social History Main Topics  . Smoking status: Never Smoker  . Smokeless tobacco: Not on file  . Alcohol use Yes     Comment: 2-3 beers twice a week.  . Drug use: No  . Sexual activity: Yes   Other Topics Concern  . Not on file    Social History Narrative  . No narrative on file    Past Surgical History:  Procedure Laterality Date  . KNEE SURGERY      Family History  Problem Relation Age of Onset  . Heart disease Mother   . Hyperlipidemia Mother   . Hypertension Mother   . Diabetes Mother   . Heart disease Father   . Hyperlipidemia Father   . Hypertension Father   . Diabetes Father     No Known Allergies  Current Outpatient Prescriptions on File Prior to Visit  Medication Sig Dispense Refill  . diclofenac (VOLTAREN) 75 MG EC tablet Take 1 tablet (75 mg total) by mouth 2 (two) times daily. 30 tablet 0  . Multiple Vitamin (MULTIVITAMIN) tablet Take 1 tablet by mouth daily.    . nitroGLYCERIN (NITRODUR - DOSED IN MG/24 HR) 0.2 mg/hr patch Apply 1/4th patch to affected shoulder, change daily 30 patch 1  . zolpidem (AMBIEN) 5 MG tablet Take 1 tablet (5 mg total) by mouth at bedtime as needed for sleep. 14 tablet 0   No current facility-administered medications on file prior to visit.     BP 122/86 (BP Location: Left Arm, Patient Position: Sitting, Cuff Size: Normal)   Pulse 73   Temp 98 F (36.7 C) (Oral)   Ht 5\' 10"  (1.778 m)   Wt 218 lb 3.2 oz (99 kg)   SpO2 98%  BMI 31.31 kg/m       Objective:   Physical Exam  General  Mental Status - Alert. General Appearance - Well groomed. Not in acute distress.  Skin Rashes- No Rashes. Posterior thorax- 4-5 small moles most look normal. One upper rt thorax about 5 mm diameter. Some color variation. Slighter darker center.  HEENT Head- Normal. Ear Auditory Canal - Left- Normal. Right - Normal.Tympanic Membrane- Left-  Mild ear faint slight red in center Right- moderate wax rt ear.  Eye Sclera/Conjunctiva- Left- Normal. Right- Normal. Nose & Sinuses Nasal Mucosa- Left-  Not Boggy and Congested. Right-  Not  Boggy and  Congested.Bilateral  No maxillary and no  frontal sinus pressure. Mouth & Throat Lips: Upper Lip- Normal: no dryness,  cracking, pallor, cyanosis, or vesicular eruption. Lower Lip-Normal: no dryness, cracking, pallor, cyanosis or vesicular eruption. Buccal Mucosa- Bilateral- No Aphthous ulcers. Oropharynx- No Discharge or Erythema. Tonsils: Characteristics- Bilateral- No Erythema or Congestion. Size/Enlargement- Bilateral- No enlargement. Discharge- bilateral-None.  Neck Neck- Supple. No Masses.   Chest and Lung Exam Auscultation: Breath Sounds:-Clear even and unlabored.  Cardiovascular Auscultation:Rythm- Regular, rate and rhythm. Murmurs & Other Heart Sounds:Ausculatation of the heart reveal- No Murmurs.  Lymphatic Head & Neck General Head & Neck Lymphatics: Bilateral: Description- No Localized lymphadenopathy.  Abdomen- soft, nontender, nondistended, +bs, no rebound or guarding or organomegaly.  Back- no cva tenderness.  Genital- normal exam. No hernias.         Assessment & Plan:  For your wellness exam will get cbc, cmp, tsh, lipid panel and ua.  Will hold your form and fill out remainder of form when labs back. Then call you.  For borderline sized mole with slighter dark center will refer you to dermatologist. Have them evaluate area. Likely normal but will be cautious and refer.  You mentioned sleeping much better with trazadone. So will rx. Stop ambien.  Recommend maybe loosing 5-10 pounds.   Follow up date to be determined after lab review or as needed

## 2015-09-12 NOTE — Progress Notes (Signed)
Pre visit review using our clinic review tool, if applicable. No additional management support is needed unless otherwise documented below in the visit note./HSM  

## 2015-09-12 NOTE — Patient Instructions (Addendum)
For your wellness exam will get cbc, cmp, tsh, lipid panel and ua.  Will hold your form and fill out remainder of form when labs back. Then call you.  For borderline sized mole with slighter dark center will refer you to dermatologist. Have them evaluate area. Likely normal but will be cautious and refer.  You mentioned sleeping much better with trazadone. So will rx. Stop ambien.  Recommend maybe loosing 5-10 pounds.   If you have any left ear discomfort over next 5 days let me know. Only faint red in center but if you get pain would rx antibiotic. If past 5 days would need to recheck.  For moderate wax rt ear this may become sympotmatic. If so then use debrox otc for 3 days then we could irrigate wax out after it is softened.  Follow up date to be determined after lab review or as needed   Preventive Care for Adults, Male A healthy lifestyle and preventive care can promote health and wellness. Preventive health guidelines for men include the following key practices:  A routine yearly physical is a good way to check with your health care provider about your health and preventative screening. It is a chance to share any concerns and updates on your health and to receive a thorough exam.  Visit your dentist for a routine exam and preventative care every 6 months. Brush your teeth twice a day and floss once a day. Good oral hygiene prevents tooth decay and gum disease.  The frequency of eye exams is based on your age, health, family medical history, use of contact lenses, and other factors. Follow your health care provider's recommendations for frequency of eye exams.  Eat a healthy diet. Foods such as vegetables, fruits, whole grains, low-fat dairy products, and lean protein foods contain the nutrients you need without too many calories. Decrease your intake of foods high in solid fats, added sugars, and salt. Eat the right amount of calories for you.Get information about a proper diet from  your health care provider, if necessary.  Regular physical exercise is one of the most important things you can do for your health. Most adults should get at least 150 minutes of moderate-intensity exercise (any activity that increases your heart rate and causes you to sweat) each week. In addition, most adults need muscle-strengthening exercises on 2 or more days a week.  Maintain a healthy weight. The body mass index (BMI) is a screening tool to identify possible weight problems. It provides an estimate of body fat based on height and weight. Your health care provider can find your BMI and can help you achieve or maintain a healthy weight.For adults 20 years and older:  A BMI below 18.5 is considered underweight.  A BMI of 18.5 to 24.9 is normal.  A BMI of 25 to 29.9 is considered overweight.  A BMI of 30 and above is considered obese.  Maintain normal blood lipids and cholesterol levels by exercising and minimizing your intake of saturated fat. Eat a balanced diet with plenty of fruit and vegetables. Blood tests for lipids and cholesterol should begin at age 66 and be repeated every 5 years. If your lipid or cholesterol levels are high, you are over 50, or you are at high risk for heart disease, you may need your cholesterol levels checked more frequently.Ongoing high lipid and cholesterol levels should be treated with medicines if diet and exercise are not working.  If you smoke, find out from your health care  provider how to quit. If you do not use tobacco, do not start.  Lung cancer screening is recommended for adults aged 64-80 years who are at high risk for developing lung cancer because of a history of smoking. A yearly low-dose CT scan of the lungs is recommended for people who have at least a 30-pack-year history of smoking and are a current smoker or have quit within the past 15 years. A pack year of smoking is smoking an average of 1 pack of cigarettes a day for 1 year (for example:  1 pack a day for 30 years or 2 packs a day for 15 years). Yearly screening should continue until the smoker has stopped smoking for at least 15 years. Yearly screening should be stopped for people who develop a health problem that would prevent them from having lung cancer treatment.  If you choose to drink alcohol, do not have more than 2 drinks per day. One drink is considered to be 12 ounces (355 mL) of beer, 5 ounces (148 mL) of wine, or 1.5 ounces (44 mL) of liquor.  Avoid use of street drugs. Do not share needles with anyone. Ask for help if you need support or instructions about stopping the use of drugs.  High blood pressure causes heart disease and increases the risk of stroke. Your blood pressure should be checked at least every 1-2 years. Ongoing high blood pressure should be treated with medicines, if weight loss and exercise are not effective.  If you are 67-51 years old, ask your health care provider if you should take aspirin to prevent heart disease.  Diabetes screening is done by taking a blood sample to check your blood glucose level after you have not eaten for a certain period of time (fasting). If you are not overweight and you do not have risk factors for diabetes, you should be screened once every 3 years starting at age 64. If you are overweight or obese and you are 46-76 years of age, you should be screened for diabetes every year as part of your cardiovascular risk assessment.  Colorectal cancer can be detected and often prevented. Most routine colorectal cancer screening begins at the age of 85 and continues through age 56. However, your health care provider may recommend screening at an earlier age if you have risk factors for colon cancer. On a yearly basis, your health care provider may provide home test kits to check for hidden blood in the stool. Use of a small camera at the end of a tube to directly examine the colon (sigmoidoscopy or colonoscopy) can detect the earliest  forms of colorectal cancer. Talk to your health care provider about this at age 35, when routine screening begins. Direct exam of the colon should be repeated every 5-10 years through age 69, unless early forms of precancerous polyps or small growths are found.  People who are at an increased risk for hepatitis B should be screened for this virus. You are considered at high risk for hepatitis B if:  You were born in a country where hepatitis B occurs often. Talk with your health care provider about which countries are considered high risk.  Your parents were born in a high-risk country and you have not received a shot to protect against hepatitis B (hepatitis B vaccine).  You have HIV or AIDS.  You use needles to inject street drugs.  You live with, or have sex with, someone who has hepatitis B.  You are a man  who has sex with other men (MSM).  You get hemodialysis treatment.  You take certain medicines for conditions such as cancer, organ transplantation, and autoimmune conditions.  Hepatitis C blood testing is recommended for all people born from [birth year] through 1965 and any individual with known risks for hepatitis C.  Practice safe sex. Use condoms and avoid high-risk sexual practices to reduce the spread of sexually transmitted infections (STIs). STIs include gonorrhea, chlamydia, syphilis, trichomonas, herpes, HPV, and human immunodeficiency virus (HIV). Herpes, HIV, and HPV are viral illnesses that have no cure. They can result in disability, cancer, and death.  If you are a man who has sex with other men, you should be screened at least once per year for:  HIV.  Urethral, rectal, and pharyngeal infection of gonorrhea, chlamydia, or both.  If you are at risk of being infected with HIV, it is recommended that you take a prescription medicine daily to prevent HIV infection. This is called preexposure prophylaxis (PrEP). You are considered at risk if:  You are a man who has sex with  other men (MSM) and have other risk factors.  You are a heterosexual man, are sexually active, and are at increased risk for HIV infection.  You take drugs by injection.  You are sexually active with a partner who has HIV.  Talk with your health care provider about whether you are at high risk of being infected with HIV. If you choose to begin PrEP, you should first be tested for HIV. You should then be tested every 3 months for as long as you are taking PrEP.  A one-time screening for abdominal aortic aneurysm (AAA) and surgical repair of large AAAs by ultrasound are recommended for men ages 77 to 55 years who are current or former smokers.  Healthy men should no longer receive prostate-specific antigen (PSA) blood tests as part of routine cancer screening. Talk with your health care provider about prostate cancer screening.  Testicular cancer screening is not recommended for adult males who have no symptoms. Screening includes self-exam, a health care provider exam, and other screening tests. Consult with your health care provider about any symptoms you have or any concerns you have about testicular cancer.  Use sunscreen. Apply sunscreen liberally and repeatedly throughout the day. You should seek shade when your shadow is shorter than you. Protect yourself by wearing long sleeves, pants, a wide-brimmed hat, and sunglasses year round, whenever you are outdoors.  Once a month, do a whole-body skin exam, using a mirror to look at the skin on your back. Tell your health care provider about new moles, moles that have irregular borders, moles that are larger than a pencil eraser, or moles that have changed in shape or color.  Stay current with required vaccines (immunizations).  Influenza vaccine. All adults should be immunized every year.  Tetanus, diphtheria, and acellular pertussis (Td, Tdap) vaccine. An adult who has not previously received Tdap or who does not know his vaccine status  should receive 1 dose of Tdap. This initial dose should be followed by tetanus and diphtheria toxoids (Td) booster doses every 10 years. Adults with an unknown or incomplete history of completing a 3-dose immunization series with Td-containing vaccines should begin or complete a primary immunization series including a Tdap dose. Adults should receive a Td booster every 10 years.  Varicella vaccine. An adult without evidence of immunity to varicella should receive 2 doses or a second dose if he has previously received 1 dose.  Human papillomavirus (HPV) vaccine. Males aged 11-21 years who have not received the vaccine previously should receive the 3-dose series. Males aged 22-26 years may be immunized. Immunization is recommended through the age of 71 years for any male who has sex with males and did not get any or all doses earlier. Immunization is recommended for any person with an immunocompromised condition through the age of 16 years if he did not get any or all doses earlier. During the 3-dose series, the second dose should be obtained 4-8 weeks after the first dose. The third dose should be obtained 24 weeks after the first dose and 16 weeks after the second dose.  Zoster vaccine. One dose is recommended for adults aged 57 years or older unless certain conditions are present.  Measles, mumps, and rubella (MMR) vaccine. Adults born before [birth year] generally are considered immune to measles and mumps. Adults born in [birth year] or later should have 1 or more doses of MMR vaccine unless there is a contraindication to the vaccine or there is laboratory evidence of immunity to each of the three diseases. A routine second dose of MMR vaccine should be obtained at least 28 days after the first dose for students attending postsecondary schools, health care workers, or international travelers. People who received inactivated measles vaccine or an unknown type of measles vaccine during 1963-1967 should receive 2 doses of  MMR vaccine. People who received inactivated mumps vaccine or an unknown type of mumps vaccine before 1979 and are at high risk for mumps infection should consider immunization with 2 doses of MMR vaccine. Unvaccinated health care workers born before [birth year] who lack laboratory evidence of measles, mumps, or rubella immunity or laboratory confirmation of disease should consider measles and mumps immunization with 2 doses of MMR vaccine or rubella immunization with 1 dose of MMR vaccine.  Pneumococcal 13-valent conjugate (PCV13) vaccine. When indicated, a person who is uncertain of his immunization history and has no record of immunization should receive the PCV13 vaccine. All adults 16 years of age and older should receive this vaccine. An adult aged 67 years or older who has certain medical conditions and has not been previously immunized should receive 1 dose of PCV13 vaccine. This PCV13 should be followed with a dose of pneumococcal polysaccharide (PPSV23) vaccine. Adults who are at high risk for pneumococcal disease should obtain the PPSV23 vaccine at least 8 weeks after the dose of PCV13 vaccine. Adults older than 35 years of age who have normal immune system function should obtain the PPSV23 vaccine dose at least 1 year after the dose of PCV13 vaccine.  Pneumococcal polysaccharide (PPSV23) vaccine. When PCV13 is also indicated, PCV13 should be obtained first. All adults aged 55 years and older should be immunized. An adult younger than age 57 years who has certain medical conditions should be immunized. Any person who resides in a nursing home or long-term care facility should be immunized. An adult smoker should be immunized. People with an immunocompromised condition and certain other conditions should receive both PCV13 and PPSV23 vaccines. People with human immunodeficiency virus (HIV) infection should be immunized as soon as possible after diagnosis. Immunization during chemotherapy or radiation therapy  should be avoided. Routine use of PPSV23 vaccine is not recommended for American Indians, [place] Natives, or people younger than 65 years unless there are medical conditions that require PPSV23 vaccine. When indicated, people who have unknown immunization and have no record of immunization should receive PPSV23 vaccine. One-time revaccination 5 years after the  first dose of PPSV23 is recommended for people aged 19-64 years who have chronic kidney failure, nephrotic syndrome, asplenia, or immunocompromised conditions. People who received 1-2 doses of PPSV23 before age 53 years should receive another dose of PPSV23 vaccine at age 18 years or later if at least 5 years have passed since the previous dose. Doses of PPSV23 are not needed for people immunized with PPSV23 at or after age 14 years.  Meningococcal vaccine. Adults with asplenia or persistent complement component deficiencies should receive 2 doses of quadrivalent meningococcal conjugate (MenACWY-D) vaccine. The doses should be obtained at least 2 months apart. Microbiologists working with certain meningococcal bacteria, [place] recruits, people at risk during an outbreak, and people who travel to or live in countries with a high rate of meningitis should be immunized. A first-year college student up through age 50 years who is living in a residence hall should receive a dose if he did not receive a dose on or after his 16th birthday. Adults who have certain high-risk conditions should receive one or more doses of vaccine.  Hepatitis A vaccine. Adults who wish to be protected from this disease, have chronic liver disease, work with hepatitis A-infected animals, work in hepatitis A research labs, or travel to or work in countries with a high rate of hepatitis A should be immunized. Adults who were previously unvaccinated and who anticipate close contact with an international adoptee during the first 60 days after arrival in the [place] from a country  with a high rate of hepatitis A should be immunized.  Hepatitis B vaccine. Adults should be immunized if they wish to be protected from this disease, are under age 23 years and have diabetes, have chronic liver disease, have had more than one sex partner in the past 6 months, may be exposed to blood or other infectious body fluids, are household contacts or sex partners of hepatitis B positive people, are clients or workers in certain care facilities, or travel to or work in countries with a high rate of hepatitis B.  Haemophilus influenzae type b (Hib) vaccine. A previously unvaccinated person with asplenia or sickle cell disease or having a scheduled splenectomy should receive 1 dose of Hib vaccine. Regardless of previous immunization, a recipient of a hematopoietic stem cell transplant should receive a 3-dose series 6-12 months after his successful transplant. Hib vaccine is not recommended for adults with HIV infection. Preventive Service / Frequency Ages 61 to 76  Blood pressure check.** / Every 3-5 years.  Lipid and cholesterol check.** / Every 5 years beginning at age 47.  Hepatitis C blood test.** / For any individual with known risks for hepatitis C.  Skin self-exam. / Monthly.  Influenza vaccine. / Every year.  Tetanus, diphtheria, and acellular pertussis (Tdap, Td) vaccine.** / Consult your health care provider. 1 dose of Td every 10 years.  Varicella vaccine.** / Consult your health care provider.  HPV vaccine. / 3 doses over 6 months, if 42 or younger.  Measles, mumps, rubella (MMR) vaccine.** / You need at least 1 dose of MMR if you were born in 1957 or later. You may also need a second dose.  Pneumococcal 13-valent conjugate (PCV13) vaccine.** / Consult your health care provider.  Pneumococcal polysaccharide (PPSV23) vaccine.** / 1 to 2 doses if you smoke cigarettes or if you have certain conditions.  Meningococcal vaccine.** / 1 dose if you are age 19 to 4 years and a  Market researcher living in a residence hall,  or have one of several medical conditions. You may also need additional booster doses.  Hepatitis A vaccine.** / Consult your health care provider.  Hepatitis B vaccine.** / Consult your health care provider.  Haemophilus influenzae type b (Hib) vaccine.** / Consult your health care provider. Ages 24 to 7  Blood pressure check.** / Every year.  Lipid and cholesterol check.** / Every 5 years beginning at age 38.  Lung cancer screening. / Every year if you are aged 82-80 years and have a 30-pack-year history of smoking and currently smoke or have quit within the past 15 years. Yearly screening is stopped once you have quit smoking for at least 15 years or develop a health problem that would prevent you from having lung cancer treatment.  Fecal occult blood test (FOBT) of stool. / Every year beginning at age 89 and continuing until age 38. You may not have to do this test if you get a colonoscopy every 10 years.  Flexible sigmoidoscopy** or colonoscopy.** / Every 5 years for a flexible sigmoidoscopy or every 10 years for a colonoscopy beginning at age 12 and continuing until age 4.  Hepatitis C blood test.** / For all people born from [birth year] through 1965 and any individual with known risks for hepatitis C.  Skin self-exam. / Monthly.  Influenza vaccine. / Every year.  Tetanus, diphtheria, and acellular pertussis (Tdap/Td) vaccine.** / Consult your health care provider. 1 dose of Td every 10 years.  Varicella vaccine.** / Consult your health care provider.  Zoster vaccine.** / 1 dose for adults aged 11 years or older.  Measles, mumps, rubella (MMR) vaccine.** / You need at least 1 dose of MMR if you were born in 1957 or later. You may also need a second dose.  Pneumococcal 13-valent conjugate (PCV13) vaccine.** / Consult your health care provider.  Pneumococcal polysaccharide (PPSV23) vaccine.** / 1 to 2 doses if you smoke  cigarettes or if you have certain conditions.  Meningococcal vaccine.** / Consult your health care provider.  Hepatitis A vaccine.** / Consult your health care provider.  Hepatitis B vaccine.** / Consult your health care provider.  Haemophilus influenzae type b (Hib) vaccine.** / Consult your health care provider. Ages 52 and over  Blood pressure check.** / Every year.  Lipid and cholesterol check.**/ Every 5 years beginning at age 44.  Lung cancer screening. / Every year if you are aged 102-80 years and have a 30-pack-year history of smoking and currently smoke or have quit within the past 15 years. Yearly screening is stopped once you have quit smoking for at least 15 years or develop a health problem that would prevent you from having lung cancer treatment.  Fecal occult blood test (FOBT) of stool. / Every year beginning at age 51 and continuing until age 68. You may not have to do this test if you get a colonoscopy every 10 years.  Flexible sigmoidoscopy** or colonoscopy.** / Every 5 years for a flexible sigmoidoscopy or every 10 years for a colonoscopy beginning at age 50 and continuing until age 66.  Hepatitis C blood test.** / For all people born from [birth year] through 1965 and any individual with known risks for hepatitis C.  Abdominal aortic aneurysm (AAA) screening.** / A one-time screening for ages 18 to 18 years who are current or former smokers.  Skin self-exam. / Monthly.  Influenza vaccine. / Every year.  Tetanus, diphtheria, and acellular pertussis (Tdap/Td) vaccine.** / 1 dose of Td every 10 years.  Varicella vaccine.** /  Consult your health care provider.  Zoster vaccine.** / 1 dose for adults aged 25 years or older.  Pneumococcal 13-valent conjugate (PCV13) vaccine.** / 1 dose for all adults aged 71 years and older.  Pneumococcal polysaccharide (PPSV23) vaccine.** / 1 dose for all adults aged 20 years and older.  Meningococcal vaccine.** / Consult your health care  provider.  Hepatitis A vaccine.** / Consult your health care provider.  Hepatitis B vaccine.** / Consult your health care provider.  Haemophilus influenzae type b (Hib) vaccine.** / Consult your health care provider. **Family history and personal history of risk and conditions may change your health care provider's recommendations.   This information is not intended to replace advice given to you by your health care provider. Make sure you discuss any questions you have with your health care provider.   Document Released: 03/16/2001 Document Revised: 02/08/2014 Document Reviewed: 06/15/2010 Elsevier Interactive Patient Education [employer].

## 2015-09-15 ENCOUNTER — Telehealth: Payer: Self-pay

## 2015-09-15 NOTE — Progress Notes (Signed)
Pt has seen results on MyChart and message also sent for patient to call back if any questions.

## 2015-09-15 NOTE — Telephone Encounter (Signed)
I left a message for the pt that his form was ready for pickup at the front desk today 09/15/15. Pt was asked to call back with any questions.Robert Sellers/[facility]

## 2015-09-22 NOTE — Telephone Encounter (Signed)
Pt seen by Esperanza RichtersEdward Saguier on 09/12/15.

## 2015-09-26 ENCOUNTER — Encounter (HOSPITAL_BASED_OUTPATIENT_CLINIC_OR_DEPARTMENT_OTHER): Payer: Self-pay | Admitting: Pharmacy Technician

## 2015-09-26 ENCOUNTER — Emergency Department (HOSPITAL_BASED_OUTPATIENT_CLINIC_OR_DEPARTMENT_OTHER)
Admission: EM | Admit: 2015-09-26 | Discharge: 2015-09-26 | Disposition: A | Discharge status: Home / Self Care | Payer: 59 | Attending: Emergency Medicine | Admitting: Emergency Medicine

## 2015-09-26 DIAGNOSIS — T63461A Toxic effect of venom of wasps, accidental (unintentional), initial encounter: Secondary | ICD-10-CM | POA: Insufficient documentation

## 2015-09-26 DIAGNOSIS — M79662 Pain in left lower leg: Secondary | ICD-10-CM | POA: Diagnosis present

## 2015-09-26 DIAGNOSIS — T63481A Toxic effect of venom of other arthropod, accidental (unintentional), initial encounter: Secondary | ICD-10-CM

## 2015-09-26 NOTE — ED Triage Notes (Signed)
Pt reports being stung by possible yellow jackets pta.  Reddened areas to left leg, buttocks, left wrist. Denies SOB and facial swelling.

## 2015-09-26 NOTE — ED Notes (Signed)
Pt was seen leaving ed prior to receiving written d/c instructions.

## 2015-09-26 NOTE — ED Provider Notes (Signed)
[facility] Note   CSN: [id number] Arrival date & time: 09/26/15  1026     History   Chief Complaint Chief Complaint  Patient presents with  . Insect Bite    HPI Robert Sellers is a 35 y.o. male.  35 year old male who presents with insect stings. Just prior to arrival, the patient was stung multiple times by what he thinks were yellow jackets. He sustained stings to his left lower leg, buttocks, and left wrist. He has been stung previously without major allergic reaction but was concerned about the number of times that he was stung which is why he came in. No face or mouth swelling, shortness of breath, difficulty breathing, vomiting, or foreign body rash. The pain in the areas of stinging has decreased and now they are itchy and tingling. No history of anaphylaxis.   The history is provided by the patient.    Past Medical History:  Diagnosis Date  . Hyperlipidemia 12/04/2012    Patient Active Problem List   Diagnosis Date Noted  . Left shoulder pain 08/28/2015  . Hyperlipidemia 12/04/2012    Past Surgical History:  Procedure Laterality Date  . KNEE SURGERY         Home Medications    Prior to Admission medications   Medication Sig Start Date End Date Taking? Authorizing Provider  diclofenac (VOLTAREN) 75 MG EC tablet Take 1 tablet (75 mg total) by mouth 2 (two) times daily. 08/12/15   Ramon DredgeEdward Saguier, PA-C  Multiple Vitamin (MULTIVITAMIN) tablet Take 1 tablet by mouth daily.    Historical Provider, MD  nitroGLYCERIN (NITRODUR - DOSED IN MG/24 HR) 0.2 mg/hr patch Apply 1/4th patch to affected shoulder, change daily 08/26/15   Lenda KelpShane R Hudnall, MD  traZODone (DESYREL) 50 MG tablet Take 0.5-1 tablets (25-50 mg total) by mouth at bedtime as needed for sleep. 09/12/15   Esperanza RichtersEdward Saguier, PA-C    Family History Family History  Problem Relation Age of Onset  . Heart disease Mother   . Hyperlipidemia Mother   . Hypertension Mother   . Diabetes Mother     . Heart disease Father   . Hyperlipidemia Father   . Hypertension Father   . Diabetes Father     Social History Social History  Substance Use Topics  . Smoking status: Never Smoker  . Smokeless tobacco: Never Used  . Alcohol use Yes     Comment: 2-3 beers twice a week.     Allergies   Review of patient's allergies indicates no known allergies.   Review of Systems Review of Systems 10 Systems reviewed and are negative for acute change except as noted in the HPI.   Physical Exam Updated Vital Signs BP 137/97 (BP Location: Right Arm)   Pulse 93   Temp 98.3 F (36.8 C) (Oral)   Resp 16   Ht 5\' 10"  (1.778 m)   Wt 215 lb (97.5 kg)   SpO2 97%   BMI 30.85 kg/m   Physical Exam  Constitutional: He is oriented to person, place, and time. He appears well-developed and well-nourished. No distress.  HENT:  Head: Normocephalic and atraumatic.  Mouth/Throat: Oropharynx is clear and moist.  Moist mucous membranes  Eyes: Conjunctivae are normal.  Neck: Neck supple.  Cardiovascular: Normal rate, regular rhythm and normal heart sounds.   No murmur heard. Pulmonary/Chest: Effort normal and breath sounds normal. He has no wheezes.  Abdominal: Soft. Bowel sounds are normal. He exhibits no distension. There is no tenderness.  Musculoskeletal: He exhibits no edema or tenderness.  Neurological: He is alert and oriented to person, place, and time.  Fluent speech  Skin: Skin is warm and dry.  Scattered raised wheels on anterior L lower leg and dorsal foot, R upper buttock/lower back, and L dorsal wrist; no rash on trunk or face  Psychiatric: He has a normal mood and affect. Judgment normal.  Nursing note and vitals reviewed.    ED Treatments / Results  Labs (all labs ordered are listed, but only abnormal results are displayed) Labs Reviewed - No data to display  EKG  EKG Interpretation None       Radiology No results found.  Procedures Procedures (including critical  care time)  Medications Ordered in ED Medications - No data to display   Initial Impression / Assessment and Plan / ED Course  I have reviewed the triage vital signs and the nursing notes.    Clinical Course  Pt w/ multiple insect stings ~1.5 hr ago. Well appearing w/ normal VS. No respiratory symptoms, vomiting, facial swelling, or other signs of anaphylaxis. Discussed supportive care with topical hydrocortisone and monitoring for any signs of anaphylaxis or skin infection. Patient voiced understanding.  Final Clinical Impressions(s) / ED Diagnoses   Final diagnoses:  Insect stings, accidental or unintentional, initial encounter    New Prescriptions New Prescriptions   No medications on file     Laurence Spates, MD 09/26/15 1133

## 2015-09-29 ENCOUNTER — Other Ambulatory Visit: Payer: Self-pay | Admitting: Medical

## 2015-09-29 DIAGNOSIS — M25512 Pain in left shoulder: Secondary | ICD-10-CM

## 2015-10-07 ENCOUNTER — Ambulatory Visit: Payer: 59 | Admitting: Family Medicine

## 2015-10-14 ENCOUNTER — Ambulatory Visit (INDEPENDENT_AMBULATORY_CARE_PROVIDER_SITE_OTHER): Payer: 59 | Admitting: Medical

## 2015-10-14 ENCOUNTER — Encounter: Payer: Self-pay | Admitting: Medical

## 2015-10-14 VITALS — BP 142/98 | HR 75 | Temp 98.6°F | Resp 16 | Ht 71.0 in | Wt 219.4 lb

## 2015-10-14 DIAGNOSIS — L089 Local infection of the skin and subcutaneous tissue, unspecified: Secondary | ICD-10-CM

## 2015-10-14 MED ORDER — DOXYCYCLINE HYCLATE 100 MG PO TABS: 100.0000 mg | ORAL_TABLET | Freq: Two times a day (BID) | ORAL | 0 refills | Status: DC

## 2015-10-14 MED ORDER — DICLOFENAC SODIUM 75 MG PO TBEC: 75.0000 mg | DELAYED_RELEASE_TABLET | Freq: Two times a day (BID) | ORAL | 0 refills | Status: DC

## 2015-10-14 NOTE — Telephone Encounter (Signed)
I saw his bp was high end of visit. Did not metnion this to him. But ask him to check bp at pharmacy or at home if has bp cuff. Want to see below 140/90. If he is getting higher numbers then need recheck in office and discuss maybe med. Most other times in office his bp  Is well controlled in past.

## 2015-10-14 NOTE — Patient Instructions (Addendum)
For skin infection likely mrsa will rx doxycycline antibiotic. Area should gradually get better since antibiotic has good mrsa coverage. As we approach weekend if not improvement let us know.  If any rapid spreading redness/worsening with fever etc then ED evaluation.  Follow up in 7-10 days or as needed

## 2015-10-14 NOTE — Progress Notes (Signed)
Pre visit review using our clinic review tool, if applicable. No additional management support is needed unless otherwise documented below in the visit note. 

## 2015-10-14 NOTE — Progress Notes (Signed)
Subjective:    Patient ID: Robert Sellers, male    DOB: 07-16-1980, 35 y.o.   MRN: [id number]  HPI   Pt in for area on back of his lt lower calf. On his left calf raised red area and small scabs. 4 areas presently. Areas are tender. Hx of mrsa in the past.   Pt did hibeclens scrub. Symptoms started on Monday x 1 day. If start med early will often get over infection relativley quickly.   Pt want to review labs. Advised conservative measures for his mild lipid elevation. Recheck in 1 yr.    Review of Systems  Constitutional: Negative for chills, fatigue and fever.  Respiratory: Negative for cough, chest tightness, shortness of breath and wheezing.   Cardiovascular: Negative for chest pain and palpitations.  Gastrointestinal: Negative for abdominal pain.  Skin:       Left lower ext bumps and scabs that are tender.  Neurological: Negative for dizziness and headaches.  Hematological: Negative for adenopathy. Does not bruise/bleed easily.  Psychiatric/Behavioral: Negative for behavioral problems.    Past Medical History:  Diagnosis Date  . Hyperlipidemia 12/04/2012     Social History   Social History  . Marital status: Single    Spouse name: N/A  . Number of children: N/A  . Years of education: N/A   Occupational History  . Not on file.   Social History Main Topics  . Smoking status: Never Smoker  . Smokeless tobacco: Never Used  . Alcohol use Yes     Comment: 2-3 beers twice a week.  . Drug use: No  . Sexual activity: Yes   Other Topics Concern  . Not on file   Social History Narrative  . No narrative on file    Past Surgical History:  Procedure Laterality Date  . KNEE SURGERY      Family History  Problem Relation Age of Onset  . Heart disease Mother   . Hyperlipidemia Mother   . Hypertension Mother   . Diabetes Mother   . Heart disease Father   . Hyperlipidemia Father   . Hypertension Father   . Diabetes Father     No Known Allergies  Current  Outpatient Prescriptions on File Prior to Visit  Medication Sig Dispense Refill  . diclofenac (VOLTAREN) 75 MG EC tablet Take 1 tablet (75 mg total) by mouth 2 (two) times daily. 30 tablet 0  . Multiple Vitamin (MULTIVITAMIN) tablet Take 1 tablet by mouth daily.    . nitroGLYCERIN (NITRODUR - DOSED IN MG/24 HR) 0.2 mg/hr patch Apply 1/4th patch to affected shoulder, change daily 30 patch 1  . traZODone (DESYREL) 50 MG tablet Take 0.5-1 tablets (25-50 mg total) by mouth at bedtime as needed for sleep. 30 tablet 3   No current facility-administered medications on file prior to visit.     BP (!) 142/98 (BP Location: Right Arm, Patient Position: Sitting, Cuff Size: Normal)   Pulse 75   Temp 98.6 F (37 C) (Oral)   Resp 16   Ht 5\' 11"  (1.803 m)   Wt 219 lb 6.4 oz (99.5 kg)   SpO2 98%   BMI 30.60 kg/m       Objective:   Physical Exam  General- No acute distress. Pleasant patient. Neck- Full range of motion, no jvd Lungs- Clear, even and unlabored. Heart- regular rate and rhythm. Neurologic- CNII- XII grossly intact.  Skin- 4 red raised red area central scabs. Tender and indurated but not fluctuant. No  dc.       Assessment & Plan:  For skin infection likely mrsa will rx doxycycline antibiotic. Area should gradually get better since antibiotic has good mrsa coverage. As we approach  weekend if not improvement let us know.  If any rapid spreading redness/worsening with fever etc then ED evaluation.  Regarding bp will ask staff to call pt and have him check pharmacy or can come back for nurse bp at his convenience. Will advise want to see bp below 140/90 majority of the time  Follow up in 7-10 days or as needed  Cristan Scherzer, Ramon DredgeEdward, [employer] CorporationPA-C

## 2015-10-15 NOTE — Telephone Encounter (Signed)
Called patient states he will try to get BP checked today.

## 2015-12-09 ENCOUNTER — Other Ambulatory Visit: Payer: Self-pay | Admitting: Medical

## 2015-12-09 DIAGNOSIS — M25512 Pain in left shoulder: Secondary | ICD-10-CM

## 2016-01-25 ENCOUNTER — Other Ambulatory Visit: Payer: Self-pay | Admitting: Medical

## 2016-02-05 ENCOUNTER — Ambulatory Visit (INDEPENDENT_AMBULATORY_CARE_PROVIDER_SITE_OTHER): Payer: 59 | Admitting: Medical

## 2016-02-05 ENCOUNTER — Encounter: Payer: Self-pay | Admitting: Medical

## 2016-02-05 VITALS — BP 140/80 | HR 79 | Temp 98.6°F | Ht 71.0 in | Wt 226.2 lb

## 2016-02-05 DIAGNOSIS — Z23 Encounter for immunization: Secondary | ICD-10-CM

## 2016-02-05 DIAGNOSIS — R030 Elevated blood-pressure reading, without diagnosis of hypertension: Secondary | ICD-10-CM | POA: Diagnosis not present

## 2016-02-05 DIAGNOSIS — G4700 Insomnia, unspecified: Secondary | ICD-10-CM | POA: Diagnosis not present

## 2016-02-05 MED ORDER — TRAZODONE HCL 100 MG PO TABS: 100.0000 mg | ORAL_TABLET | Freq: Every day | ORAL | 3 refills | Status: DC

## 2016-02-05 NOTE — Progress Notes (Signed)
Pre visit review using our clinic tool,if applicable. No additional management support is needed unless otherwise documented below in the visit note.  

## 2016-02-05 NOTE — Patient Instructions (Addendum)
For your insmonia will rx trazadone.(but increased dose)  Continue to check your bp at home. Only borderline hight today.(if bp trend over 140/90 let us know)  Eat healthy  diet and exercise daily.  Flu vaccine today.  Follow up in 6 months or as needed

## 2016-02-05 NOTE — Progress Notes (Signed)
Subjective:    Patient ID: Robert Sellers, male    DOB: [birth year], 36 y.o.   MRN: [id number]  HPI  Pt in for a follow up. He states he does not take med every night. He only uses it 2-3 nights a week. When he does use he finds 100 mg tab dose will keep him asleep all night. No anxiety or depression reported.  Pt recently started working in [employer] for [employer].  Pt blood pressure at home has been 130/85 range. Little higher here initially.    Review of Systems  Constitutional: Negative for chills, fatigue and fever.  Respiratory: Negative for cough, chest tightness, shortness of breath and wheezing.   Cardiovascular: Negative for chest pain and palpitations.  Gastrointestinal: Negative for abdominal pain.  Neurological: Negative for dizziness, facial asymmetry, speech difficulty, weakness and headaches.  Hematological: Negative for adenopathy. Does not bruise/bleed easily.  Psychiatric/Behavioral: Negative for confusion.    Past Medical History:  Diagnosis Date  . Hyperlipidemia 12/04/2012     Social History   Social History  . Marital status: Single    Spouse name: N/A  . Number of children: N/A  . Years of education: N/A   Occupational History  . Not on file.   Social History Main Topics  . Smoking status: Never Smoker  . Smokeless tobacco: Never Used  . Alcohol use Yes     Comment: 2-3 beers twice a week.  . Drug use: No  . Sexual activity: Yes   Other Topics Concern  . Not on file   Social History Narrative  . No narrative on file    Past Surgical History:  Procedure Laterality Date  . KNEE SURGERY      Family History  Problem Relation Age of Onset  . Heart disease Mother   . Hyperlipidemia Mother   . Hypertension Mother   . Diabetes Mother   . Heart disease Father   . Hyperlipidemia Father   . Hypertension Father   . Diabetes Father     No Known Allergies  Current Outpatient Prescriptions on File Prior to Visit  Medication  Sig Dispense Refill  . Multiple Vitamin (MULTIVITAMIN) tablet Take 1 tablet by mouth daily.    . traZODone (DESYREL) 50 MG tablet Take 0.5-1 tablets (25-50 mg total) by mouth at bedtime as needed for sleep. 30 tablet 3  . diclofenac (VOLTAREN) 75 MG EC tablet Take 1 tablet (75 mg total) by mouth 2 (two) times daily. (Patient not taking: Reported on 02/05/2016) 30 tablet 0  . doxycycline (VIBRA-TABS) 100 MG tablet Take 1 tablet (100 mg total) by mouth 2 (two) times daily. (Patient not taking: Reported on 02/05/2016) 20 tablet 0  . nitroGLYCERIN (NITRODUR - DOSED IN MG/24 HR) 0.2 mg/hr patch Apply 1/4th patch to affected shoulder, change daily (Patient not taking: Reported on 02/05/2016) 30 patch 1   No current facility-administered medications on file prior to visit.     BP 140/80   Pulse 79   Temp 98.6 F (37 C) (Oral)   Ht 5\' 11"  (1.803 m)   Wt 226 lb 3.2 oz (102.6 kg)   SpO2 98%   BMI 31.55 kg/m       Objective:   Physical Exam   General Mental Status- Alert. General Appearance- Not in acute distress.   Skin General: Color- Normal Color. Moisture- Normal Moisture.  Neck Carotid Arteries- Normal color. Moisture- Normal Moisture. No carotid bruits. No JVD.  Chest and Lung Exam  Auscultation: Breath Sounds:-Normal.  Cardiovascular Auscultation:Rythm- Regular. Murmurs & Other Heart Sounds:Auscultation of the heart reveals- No Murmurs.   Neurologic Cranial Nerve exam:- CN III-XII intact(No nystagmus), symmetric smile. Strength:- 5/5 equal and symmetric strength both upper and lower extremities.      Assessment & Plan:  For your insmonia will rx trazadone.(but increased dose)  Continue to check your bp at home. Only borderline hight today.(if bp trend over 140/90 let us know)  Eat healthy  diet and exercise daily.  Flu vaccine today.  Follow up in 6 months or as needed  Yazan Gatling, Ramon DredgeEdward, [employer]PA-C

## 2016-05-20 ENCOUNTER — Encounter (HOSPITAL_BASED_OUTPATIENT_CLINIC_OR_DEPARTMENT_OTHER): Payer: Self-pay | Admitting: *Deleted

## 2016-05-20 ENCOUNTER — Emergency Department (HOSPITAL_BASED_OUTPATIENT_CLINIC_OR_DEPARTMENT_OTHER)
Admission: EM | Admit: 2016-05-20 | Discharge: 2016-05-20 | Disposition: A | Discharge status: Home / Self Care | Payer: 59 | Attending: Emergency Medicine | Admitting: Emergency Medicine

## 2016-05-20 ENCOUNTER — Emergency Department (HOSPITAL_BASED_OUTPATIENT_CLINIC_OR_DEPARTMENT_OTHER): Payer: 59

## 2016-05-20 DIAGNOSIS — Y999 Unspecified external cause status: Secondary | ICD-10-CM | POA: Insufficient documentation

## 2016-05-20 DIAGNOSIS — Y929 Unspecified place or not applicable: Secondary | ICD-10-CM | POA: Insufficient documentation

## 2016-05-20 DIAGNOSIS — Y9389 Activity, other specified: Secondary | ICD-10-CM | POA: Insufficient documentation

## 2016-05-20 DIAGNOSIS — Z23 Encounter for immunization: Secondary | ICD-10-CM | POA: Insufficient documentation

## 2016-05-20 DIAGNOSIS — W278XXA Contact with other nonpowered hand tool, initial encounter: Secondary | ICD-10-CM | POA: Diagnosis not present

## 2016-05-20 DIAGNOSIS — S61210A Laceration without foreign body of right index finger without damage to nail, initial encounter: Secondary | ICD-10-CM | POA: Diagnosis present

## 2016-05-20 MED ORDER — OXYCODONE-ACETAMINOPHEN 5-325 MG PO TABS
1.0000 | ORAL_TABLET | Freq: Once | ORAL | Status: AC
  Administered 2016-05-20: 1 via ORAL
  Filled 2016-05-20: qty 1

## 2016-05-20 MED ORDER — DOXYCYCLINE HYCLATE 100 MG PO CAPS: 100.0000 mg | ORAL_CAPSULE | Freq: Two times a day (BID) | ORAL | 0 refills | Status: DC

## 2016-05-20 MED ORDER — TETANUS-DIPHTH-ACELL PERTUSSIS 5-2.5-18.5 LF-MCG/0.5 IM SUSP: 0.5000 mL | Freq: Once | INTRAMUSCULAR | Status: DC

## 2016-05-20 MED ORDER — TETANUS-DIPHTH-ACELL PERTUSSIS 5-2.5-18.5 LF-MCG/0.5 IM SUSP
0.5000 mL | Freq: Once | INTRAMUSCULAR | Status: DC
  Filled 2016-05-20: qty 0.5

## 2016-05-20 MED FILL — DOXYCYCLINE HYC 100 MG CAP: 100 | 10 days supply | Qty: 20 | Fill #0

## 2016-05-20 NOTE — Discharge Instructions (Signed)
Take doxycycline twice daily for 10 days. Continue anti-inflammatories and Tylenol as needed for pain control. Return to ED for worsening pain, additional drainage, trouble moving finger, weakness, numbness, additional injury in the area.

## 2016-05-20 NOTE — ED Triage Notes (Signed)
He was removing a board from a deck and the wood scraped along his right index finger causing a partial avulsion of the skin. Bleeding controlled with pressure.

## 2016-05-20 NOTE — ED Provider Notes (Signed)
[facility]   CSN: [id number] Arrival date & time: 05/20/16  1038     History   Chief Complaint Chief Complaint  Patient presents with  . Laceration    HPI Robert Sellers is a 36 y.o. male.  Patient presents with right index finger laceration that occurred earlier today when he was using a hammer to try to remove the report from his deck. He reports inability to bend the finger. Reports bleeding at first but he was able to control it with pressure. Denies any prior injury or procedure done to this finger in the past. He denies any numbness, tingling, wrist pain, pain in the palm. Not tried taking anything for the pain prior to arrival.      Past Medical History:  Diagnosis Date  . Hyperlipidemia 12/04/2012    Patient Active Problem List   Diagnosis Date Noted  . Left shoulder pain 08/28/2015  . Hyperlipidemia 12/04/2012    Past Surgical History:  Procedure Laterality Date  . KNEE SURGERY         Home Medications    Prior to Admission medications   Medication Sig Start Date End Date Taking? Authorizing Provider  Multiple Vitamin (MULTIVITAMIN) tablet Take 1 tablet by mouth daily.   Yes Historical Provider, MD  traZODone (DESYREL) 50 MG tablet TAKE 1/2 TO 1 TABLET BY MOUTH AT BEDTIME AS NEEDED FOR SLEEP 02/10/16  Yes Esperanza Richters, PA-C  diclofenac (VOLTAREN) 75 MG EC tablet Take 1 tablet (75 mg total) by mouth 2 (two) times daily. 10/14/15   Ramon Dredge Saguier, PA-C  doxycycline (VIBRAMYCIN) 100 MG capsule Take 1 capsule (100 mg total) by mouth 2 (two) times daily. 05/20/16   Raghad Lorenz, PA-C  nitroGLYCERIN (NITRODUR - DOSED IN MG/24 HR) 0.2 mg/hr patch Apply 1/4th patch to affected shoulder, change daily 08/26/15   Lenda Kelp, MD  traZODone (DESYREL) 100 MG tablet Take 1 tablet (100 mg total) by mouth at bedtime. 02/05/16   Esperanza Richters, PA-C    Family History Family History  Problem Relation Age of Onset  . Heart disease Mother   .  Hyperlipidemia Mother   . Hypertension Mother   . Diabetes Mother   . Heart disease Father   . Hyperlipidemia Father   . Hypertension Father   . Diabetes Father     Social History Social History  Substance Use Topics  . Smoking status: Never Smoker  . Smokeless tobacco: Never Used  . Alcohol use Yes     Comment: 2-3 beers twice a week.     Allergies   Patient has no known allergies.   Review of Systems Review of Systems  Constitutional: Negative for chills and fever.  Respiratory: Negative for chest tightness and shortness of breath.   Cardiovascular: Negative for chest pain.  Gastrointestinal: Negative for nausea and vomiting.  Musculoskeletal: Positive for joint swelling.  Skin: Positive for color change and wound. Negative for rash.  Neurological: Negative for dizziness, seizures, weakness and light-headedness.  All other systems reviewed and are negative.    Physical Exam Updated Vital Signs BP (!) 151/104   Pulse 71   Temp 98.9 F (37.2 C) (Oral)   Resp 20   Ht  (1.803 m)   Wt 97.5 kg   SpO2 95%   BMI 29.99 kg/m   Physical Exam  Constitutional: He appears well-developed and well-nourished. No distress.  HENT:  Head: Normocephalic and atraumatic.  Nose: Nose normal.  Eyes: Conjunctivae and  EOM are normal. Right eye exhibits no discharge. Left eye exhibits no discharge. No scleral icterus.  Cardiovascular: Normal rate, regular rhythm, normal heart sounds and intact distal pulses.  Exam reveals no gallop and no friction rub.   No murmur heard. Pulmonary/Chest: Effort normal and breath sounds normal. No respiratory distress.  Musculoskeletal: Normal range of motion. He exhibits no edema.  There is a 1 cm linear superficial abrasion of the top layer of the skin on the dorsal aspect of the right index finger. No laceration noted. Bleeding is controlled. There is limited range of motion of the finger at the DIP and PIP joints before pain medication  administered. However after pain medication he has full active and passive range of motion of the joints. No signs of tenosynovitis. No signs of tendon involvement. Good pulses bilaterally. Patient is able to move all other digits and wrist.  Neurological: He is alert. He exhibits normal muscle tone. Coordination normal.  Skin: Skin is warm and dry. No rash noted.  Psychiatric: He has a normal mood and affect.  Nursing note and vitals reviewed.    ED Treatments / Results  Labs (all labs ordered are listed, but only abnormal results are displayed) Labs Reviewed - No data to display  EKG  EKG Interpretation None       Radiology Dg Finger Index Right  Result Date: 05/20/2016 CLINICAL DATA:  Injury.  Abrasion. EXAM: RIGHT INDEX FINGER 2+V COMPARISON:  No prior . FINDINGS: No acute bony abnormality identified. No evidence of fracture. No radiopaque foreign body . IMPRESSION: No acute or focal abnormality. Electronically Signed   By: Maisie Fus  Register   On: 05/20/2016 11:32    Procedures Procedures (including critical care time)  Medications Ordered in ED Medications  oxyCODONE-acetaminophen (PERCOCET/ROXICET) 5-325 MG per tablet 1 tablet (1 tablet Oral Given 05/20/16 1121)     Initial Impression / Assessment and Plan / ED Course  I have reviewed the triage vital signs and the nursing notes.  Pertinent labs & imaging results that were available during my care of the patient were reviewed by me and considered in my medical decision making (see chart for details).     Patient's history and symptoms concerning for superficial abrasion of the skin on the right index finger. No sign of tendon involvement as patient is able to bend at all joints passively and actively after pain medication was given. No diffuse swelling noted. X-rays were obtained and showed no acute or focal abnormality including fracture or foreign body. Tetanus did not have to be updated here because he has received  it 2 years ago. Area was cleaned and dressed appropriately. Patient reports a history of MRSA infections in the past. We'll cover him with Doxy for prevention of further infection. Pain control given here in the ED. Advised to follow up with PCP as needed. Return precautions given.  Final Clinical Impressions(s) / ED Diagnoses   Final diagnoses:  Laceration of right index finger without foreign body without damage to nail, initial encounter    New Prescriptions New Prescriptions   DOXYCYCLINE (VIBRAMYCIN) 100 MG CAPSULE    Take 1 capsule (100 mg total) by mouth 2 (two) times daily.     Dietrich Pates, PA-C 05/20/16 1235    Gwyneth Sprout, MD 05/20/16 1547

## 2016-05-20 NOTE — ED Notes (Signed)
Patient transported to X-ray 

## 2016-06-09 ENCOUNTER — Ambulatory Visit (INDEPENDENT_AMBULATORY_CARE_PROVIDER_SITE_OTHER): Payer: 59 | Admitting: Medical

## 2016-06-09 ENCOUNTER — Encounter: Payer: Self-pay | Admitting: Medical

## 2016-06-09 VITALS — BP 149/92 | HR 85 | Temp 98.2°F | Resp 16 | Ht 71.0 in | Wt 225.6 lb

## 2016-06-09 DIAGNOSIS — I10 Essential (primary) hypertension: Secondary | ICD-10-CM

## 2016-06-09 DIAGNOSIS — R1013 Epigastric pain: Secondary | ICD-10-CM

## 2016-06-09 DIAGNOSIS — F1010 Alcohol abuse, uncomplicated: Secondary | ICD-10-CM | POA: Diagnosis not present

## 2016-06-09 DIAGNOSIS — F419 Anxiety disorder, unspecified: Secondary | ICD-10-CM

## 2016-06-09 DIAGNOSIS — F439 Reaction to severe stress, unspecified: Secondary | ICD-10-CM | POA: Diagnosis not present

## 2016-06-09 DIAGNOSIS — R110 Nausea: Secondary | ICD-10-CM

## 2016-06-09 DIAGNOSIS — E785 Hyperlipidemia, unspecified: Secondary | ICD-10-CM

## 2016-06-09 MED ORDER — RANITIDINE HCL 150 MG PO CAPS: 150.0000 mg | ORAL_CAPSULE | Freq: Two times a day (BID) | ORAL | 0 refills | Status: DC

## 2016-06-09 MED ORDER — VENLAFAXINE HCL 37.5 MG PO TABS: 37.5000 mg | ORAL_TABLET | Freq: Two times a day (BID) | ORAL | 0 refills | Status: DC

## 2016-06-09 MED ORDER — LOSARTAN POTASSIUM 100 MG PO TABS: 100.0000 mg | ORAL_TABLET | Freq: Every day | ORAL | 0 refills | Status: DC

## 2016-06-09 NOTE — Progress Notes (Signed)
Pre visit review using our clinic review tool, if applicable. No additional management support is needed unless otherwise documented below in the visit note. 

## 2016-06-09 NOTE — Patient Instructions (Addendum)
For recent abd pain rx ranitidine and eat healthy diet. Please stop alcohol use as this can cause various Gi problems as I mentioned.  Will get cbc, cmp, amylase, lipase, and ggt today.  If pain persists despite above then consider abdomen ultrasound.  If any worsening pain notify us.  For stress and anxiety rx effexor.  For htn will rx losartan 100 mg a day  Follow up in 2-3 weeks or as needed

## 2016-06-09 NOTE — Progress Notes (Signed)
Subjective:    Patient ID: Robert Sellers, male    DOB: [birth year], 36 y.o.   MRN: [id number]  HPI   Pt in for some abdomen pain. He admits to drinking alcohol too much. He drinks of hard liquor over past 2 months. About 4 nights out of the week. Pt states some abdomen pain today. Pt denies any chronic gerd history. No black or bloody stools. Pt has some occasional intermittent pain on rt side upper abdomen. Pain about every other day. Lasts for about 2-4 hours. He estimated past 3-4 weeks with rt upper abdomen pain. At time mild throb pain. No pain noticed in rt upper abdomen.   Epigastric pain that he noticed today occurred after eating breakfast.  Pt states he seems to be drinking more due to stress and daily anxiety  Pt bp high today. He checks at pharmacy occasionally and 130/100 range.   Pt not taking any medication for his stomach. Now or recent past.   Review of Systems  Constitutional: Negative for chills, fatigue and fever.  Respiratory: Negative for cough, chest tightness, shortness of breath and wheezing.   Cardiovascular: Negative for chest pain and palpitations.  Gastrointestinal: Positive for abdominal pain. Negative for abdominal distention, blood in stool, constipation, diarrhea, nausea, rectal pain and vomiting.  Musculoskeletal: Negative for back pain.  Neurological: Negative for dizziness, seizures, weakness, numbness and headaches.  Hematological: Negative for adenopathy. Does not bruise/bleed easily.  Psychiatric/Behavioral: Negative for behavioral problems and confusion.    Past Medical History:  Diagnosis Date  . Hyperlipidemia 12/04/2012     Social History   Social History  . Marital status: Single    Spouse name: N/A  . Number of children: N/A  . Years of education: N/A   Occupational History  . Not on file.   Social History Main Topics  . Smoking status: Never Smoker  . Smokeless tobacco: Never Used  . Alcohol use Yes     Comment: 2-3 beers  twice a week.  . Drug use: No  . Sexual activity: Yes   Other Topics Concern  . Not on file   Social History Narrative  . No narrative on file    Past Surgical History:  Procedure Laterality Date  . KNEE SURGERY      Family History  Problem Relation Age of Onset  . Heart disease Mother   . Hyperlipidemia Mother   . Hypertension Mother   . Diabetes Mother   . Heart disease Father   . Hyperlipidemia Father   . Hypertension Father   . Diabetes Father     No Known Allergies  Current Outpatient Prescriptions on File Prior to Visit  Medication Sig Dispense Refill  . diclofenac (VOLTAREN) 75 MG EC tablet Take 1 tablet (75 mg total) by mouth 2 (two) times daily. 30 tablet 0  . doxycycline (VIBRAMYCIN) 100 MG capsule Take 1 capsule (100 mg total) by mouth 2 (two) times daily. 20 capsule 0  . Multiple Vitamin (MULTIVITAMIN) tablet Take 1 tablet by mouth daily.    . nitroGLYCERIN (NITRODUR - DOSED IN MG/24 HR) 0.2 mg/hr patch Apply 1/4th patch to affected shoulder, change daily 30 patch 1  . traZODone (DESYREL) 100 MG tablet Take 1 tablet (100 mg total) by mouth at bedtime. 30 tablet 3  . traZODone (DESYREL) 50 MG tablet TAKE 1/2 TO 1 TABLET BY MOUTH AT BEDTIME AS NEEDED FOR SLEEP 30 tablet 0   No current facility-administered medications on file prior to  visit.     BP (!) 149/92   Pulse 85   Temp 98.2 F (36.8 C) (Oral)   Resp 16   Ht 5\' 11"  (1.803 m)   Wt 225 lb 9.6 oz (102.3 kg)   SpO2 96%   BMI 31.46 kg/m       Objective:   Physical Exam  General Appearance- Not in acute distress.  HEENT Eyes- Scleraeral/Conjuntiva-bilat- Not Yellow. Mouth & Throat- Normal.  Chest and Lung Exam Auscultation: Breath sounds:-Normal. Adventitious sounds:- No Adventitious sounds.  Cardiovascular Auscultation:Rythm - Regular. Heart Sounds -Normal heart sounds.  Abdomen Inspection:-Inspection Normal.  Palpation/Perucssion: Palpation and Percussion of the abdomen reveal-  fant epigastiric Tender, No Rebound tenderness, No rigidity(Guarding) and No Palpable abdominal masses.  Liver:-Normal.  Spleen:- Normal.   Back- no cva tenderness      Assessment & Plan:  For recent abd pain rx ranitidine and eat healthy diet. Please stop alcohol use as this can cause various Gi problems as I mentioned.  Will get cbc, cmp, amylase, lipase, and ggt today.  If pain persists despite above then consider abdomen ultrasound.  If any worsening pain notify us.  For stress and anxiety rx effexor.  For htn will rx losartan 100 mg a day  Follow up in 2-3 weeks or as needed  Robert Sellers, Robert Sellers, [name]

## 2016-06-10 LAB — AMYLASE: Amylase: 33 U/L (ref 27–131)

## 2016-06-10 LAB — COMPREHENSIVE METABOLIC PANEL
ALT: 97 U/L — ABNORMAL HIGH (ref 0–53)
AST: 66 U/L — ABNORMAL HIGH (ref 0–37)
Albumin: 4.7 g/dL (ref 3.5–5.2)
Alkaline Phosphatase: 92 U/L (ref 39–117)
BILIRUBIN TOTAL: 0.6 mg/dL (ref 0.2–1.2)
BUN: 16 mg/dL (ref 6–23)
CO2: 28 meq/L (ref 19–32)
Calcium: 10.2 mg/dL (ref 8.4–10.5)
Chloride: 103 mEq/L (ref 96–112)
Creatinine, Ser: 0.97 mg/dL (ref 0.40–1.50)
GFR: 93.4 mL/min (ref >60.00)
Glucose, Bld: 92 mg/dL (ref 70–99)
Potassium: 4.9 mEq/L (ref 3.5–5.1)
Sodium: 139 mEq/L (ref 135–145)
Total Protein: 7.9 g/dL (ref 6.0–8.3)

## 2016-06-10 LAB — LIPID PANEL
Cholesterol: 186 mg/dL (ref 0–200)
HDL: 58.1 mg/dL (ref >39.00)
LDL CALC: 98 mg/dL (ref 0–99)
NonHDL: 127.98
Total CHOL/HDL Ratio: 3
Triglycerides: 151 mg/dL — ABNORMAL HIGH (ref 0.0–149.0)
VLDL: 30.2 mg/dL (ref 0.0–40.0)

## 2016-06-10 LAB — CBC WITH DIFFERENTIAL/PLATELET
BASOS PCT: 1 % (ref 0.0–3.0)
Basophils Absolute: 0.1 10*3/uL (ref 0.0–0.1)
EOS PCT: 1.7 % (ref 0.0–5.0)
Eosinophils Absolute: 0.2 10*3/uL (ref 0.0–0.7)
HCT: 45.3 % (ref 39.0–52.0)
HEMOGLOBIN: 15.4 g/dL (ref 13.0–17.0)
Lymphocytes Relative: 22.4 % (ref 12.0–46.0)
Lymphs Abs: 2.2 10*3/uL (ref 0.7–4.0)
MCHC: 34 g/dL (ref 30.0–36.0)
MCV: 96.9 fl (ref 78.0–100.0)
Monocytes Absolute: 0.6 10*3/uL (ref 0.1–1.0)
Monocytes Relative: 5.9 % (ref 3.0–12.0)
Neutro Abs: 6.9 10*3/uL (ref 1.4–7.7)
Neutrophils Relative %: 69 % (ref 43.0–77.0)
Platelets: 294 10*3/uL (ref 150.0–400.0)
RBC: 4.68 Mil/uL (ref 4.22–5.81)
RDW: 12.9 % (ref 11.5–15.5)
WBC: 10 10*3/uL (ref 4.0–10.5)

## 2016-06-10 LAB — GAMMA GT: GGT: 131 U/L — AB (ref 7–51)

## 2016-06-10 LAB — LIPASE: Lipase: 8 U/L — ABNORMAL LOW (ref 11.0–59.0)

## 2016-06-30 ENCOUNTER — Other Ambulatory Visit: Payer: Self-pay | Admitting: Medical

## 2016-07-02 NOTE — Telephone Encounter (Signed)
[facility] [place] [facility] to fu on refill request. Pt out of meds as 06/09/16 we sent in 15 day supply. Please call or send new RX.

## 2016-07-03 ENCOUNTER — Other Ambulatory Visit: Payer: Self-pay | Admitting: Medical

## 2016-07-07 ENCOUNTER — Other Ambulatory Visit: Payer: Self-pay | Admitting: Medical

## 2016-07-09 NOTE — Telephone Encounter (Signed)
[facility] REQ 6/6 generic Zantac and generic Cozaar. [phone] [facility]

## 2016-08-11 ENCOUNTER — Other Ambulatory Visit: Payer: Self-pay | Admitting: Medical

## 2016-08-13 NOTE — Telephone Encounter (Signed)
[id number] [place], [place] - [address]  °

## 2016-09-23 ENCOUNTER — Encounter: Payer: Self-pay | Admitting: Medical

## 2016-09-23 ENCOUNTER — Telehealth: Payer: Self-pay | Admitting: Medical

## 2016-09-23 NOTE — Telephone Encounter (Signed)
No charge. 

## 2016-09-23 NOTE — Telephone Encounter (Signed)
error:[id number] ° °

## 2016-09-23 NOTE — Telephone Encounter (Addendum)
Patient cancelled 3pm appointment for today, patient Robert Sellers to 10/07/16 with PCP, charge or no charge

## 2016-10-07 ENCOUNTER — Encounter: Payer: 59 | Admitting: Medical

## 2016-10-07 DIAGNOSIS — Z0289 Encounter for other administrative examinations: Secondary | ICD-10-CM

## 2016-10-21 ENCOUNTER — Ambulatory Visit (INDEPENDENT_AMBULATORY_CARE_PROVIDER_SITE_OTHER): Payer: 59 | Admitting: Medical

## 2016-10-21 ENCOUNTER — Telehealth: Payer: Self-pay | Admitting: Medical

## 2016-10-21 ENCOUNTER — Encounter: Payer: Self-pay | Admitting: Medical

## 2016-10-21 VITALS — BP 132/89 | HR 98 | Temp 98.6°F | Resp 16 | Ht 73.0 in | Wt 218.8 lb

## 2016-10-21 DIAGNOSIS — Z0000 Encounter for general adult medical examination without abnormal findings: Secondary | ICD-10-CM | POA: Diagnosis not present

## 2016-10-21 DIAGNOSIS — Z113 Encounter for screening for infections with a predominantly sexual mode of transmission: Secondary | ICD-10-CM | POA: Diagnosis not present

## 2016-10-21 DIAGNOSIS — Z23 Encounter for immunization: Secondary | ICD-10-CM | POA: Diagnosis not present

## 2016-10-21 DIAGNOSIS — R319 Hematuria, unspecified: Secondary | ICD-10-CM

## 2016-10-21 DIAGNOSIS — R809 Proteinuria, unspecified: Secondary | ICD-10-CM

## 2016-10-21 LAB — POC URINALSYSI DIPSTICK (AUTOMATED)
Bilirubin, UA: NEGATIVE
Glucose, UA: NEGATIVE
KETONES UA: NEGATIVE
Leukocytes, UA: NEGATIVE
Nitrite, UA: NEGATIVE
PH UA: 6 (ref 5.0–8.0)
UROBILINOGEN UA: NEGATIVE U/dL — AB

## 2016-10-21 NOTE — Telephone Encounter (Signed)
Future urine order placed. Advise patient when he gets a labs done tomorrow not to give urine sample but to do the study in 2-3 weeks.

## 2016-10-21 NOTE — Patient Instructions (Addendum)
For you wellness exam today I have ordered cbc, cmp, tsh, lipid panel, ua and hiv.  Flu vaccine given today.  Recommend exercise and healthy diet.  We will let you know lab results as they come in.  Follow up date appointment will be determined after lab review.    Preventive Care 18-39 Years, Male Preventive care refers to lifestyle choices and visits with your health care provider that can promote health and wellness. What does preventive care include?  A yearly physical exam. This is also called an annual well check.  Dental exams once or twice a year.  Routine eye exams. Ask your health care provider how often you should have your eyes checked.  Personal lifestyle choices, including: ? Daily care of your teeth and gums. ? Regular physical activity. ? Eating a healthy diet. ? Avoiding tobacco and drug use. ? Limiting alcohol use. ? Practicing safe sex. What happens during an annual well check? The services and screenings done by your health care provider during your annual well check will depend on your age, overall health, lifestyle risk factors, and family history of disease. Counseling Your health care provider may ask you questions about your:  Alcohol use.  Tobacco use.  Drug use.  Emotional well-being.  Home and relationship well-being.  Sexual activity.  Eating habits.  Work and work Statistician.  Screening You may have the following tests or measurements:  Height, weight, and BMI.  Blood pressure.  Lipid and cholesterol levels. These may be checked every 5 years starting at age 44.  Diabetes screening. This is done by checking your blood sugar (glucose) after you have not eaten for a while (fasting).  Skin check.  Hepatitis C blood test.  Hepatitis B blood test.  Sexually transmitted disease (STD) testing.  Discuss your test results, treatment options, and if necessary, the need for more tests with your health care  provider. Vaccines Your health care provider may recommend certain vaccines, such as:  Influenza vaccine. This is recommended every year.  Tetanus, diphtheria, and acellular pertussis (Tdap, Td) vaccine. You may need a Td booster every 10 years.  Varicella vaccine. You may need this if you have not been vaccinated.  HPV vaccine. If you are 39 or younger, you may need three doses over 6 months.  Measles, mumps, and rubella (MMR) vaccine. You may need at least one dose of MMR.You may also need a second dose.  Pneumococcal 13-valent conjugate (PCV13) vaccine. You may need this if you have certain conditions and have not been vaccinated.  Pneumococcal polysaccharide (PPSV23) vaccine. You may need one or two doses if you smoke cigarettes or if you have certain conditions.  Meningococcal vaccine. One dose is recommended if you are age 81-21 years and a first-year college student living in a residence hall, or if you have one of several medical conditions. You may also need additional booster doses.  Hepatitis A vaccine. You may need this if you have certain conditions or if you travel or work in places where you may be exposed to hepatitis A.  Hepatitis B vaccine. You may need this if you have certain conditions or if you travel or work in places where you may be exposed to hepatitis B.  Haemophilus influenzae type b (Hib) vaccine. You may need this if you have certain risk factors.  Talk to your health care provider about which screenings and vaccines you need and how often you need them. This information is not intended to replace advice  given to you by your health care provider. Make sure you discuss any questions you have with your health care provider. Document Released: 03/16/2001 Document Revised: 10/08/2015 Document Reviewed: 11/19/2014 Elsevier Interactive Patient Education  2017 [employer].

## 2016-10-21 NOTE — Progress Notes (Signed)
Subjective:    Patient ID: Robert Sellers, male    DOB: 28-Jun-1980, 36 y.o.   MRN: [id number]  HPI  Pt in for CPE.  Pt not fasting.  Pt wants flu vaccine today.  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 Married-71.36 year old. Pt drinking a lot less alcohol. Now only 5 beers a week.  Pt has not been checking his bp. When he did trended better and then stopped.  Pt willing to get hiv screen.    Review of Systems  Constitutional: Positive for fatigue. Negative for chills and fever.       Faint fatigue. May be work-related?  HENT: Negative for congestion, drooling, ear pain, mouth sores, sinus pain, sinus pressure, sneezing and sore throat.   Respiratory: Negative for cough, chest tightness, shortness of breath and wheezing.   Cardiovascular: Negative for chest pain and palpitations.  Gastrointestinal: Negative for abdominal pain, constipation, nausea and vomiting.  Genitourinary: Negative for dysuria, flank pain, frequency and urgency.  Musculoskeletal: Negative for back pain and myalgias.  Skin: Negative for rash.  Neurological: Negative for dizziness, speech difficulty, weakness, light-headedness and headaches.  Hematological: Negative for adenopathy. Does not bruise/bleed easily.  Psychiatric/Behavioral: Negative for behavioral problems, confusion, decreased concentration, dysphoric mood and sleep disturbance. The patient is not nervous/anxious.     Past Medical History:  Diagnosis Date  . Hyperlipidemia 12/04/2012     Social History   Social History  . Marital status: Single    Spouse name: N/A  . Number of children: N/A  . Years of education: N/A   Occupational History  . Not on file.   Social History Main Topics  . Smoking status: Never Smoker  . Smokeless tobacco: Never Used  . Alcohol use Yes     Comment: 2-3 beers twice a week.  . Drug use: No  . Sexual activity: Yes   Other Topics  Concern  . Not on file   Social History Narrative  . No narrative on file    Past Surgical History:  Procedure Laterality Date  . KNEE SURGERY      Family History  Problem Relation Age of Onset  . Heart disease Mother   . Hyperlipidemia Mother   . Hypertension Mother   . Diabetes Mother   . Heart disease Father   . Hyperlipidemia Father   . Hypertension Father   . Diabetes Father     No Known Allergies  Current Outpatient Prescriptions on File Prior to Visit  Medication Sig Dispense Refill  . doxycycline (VIBRAMYCIN) 100 MG capsule Take 1 capsule (100 mg total) by mouth 2 (two) times daily. 20 capsule 0  . losartan (COZAAR) 100 MG tablet TAKE 1 TABLET BY MOUTH EVERY DAY 30 tablet 0  . Multiple Vitamin (MULTIVITAMIN) tablet Take 1 tablet by mouth daily.    . nitroGLYCERIN (NITRODUR - DOSED IN MG/24 HR) 0.2 mg/hr patch Apply 1/4th patch to affected shoulder, change daily 30 patch 1  . ranitidine (ZANTAC) 150 MG capsule TAKE 1 CAPSULE BY MOUTH TWICE A DAY 60 capsule 0  . traZODone (DESYREL) 100 MG tablet TAKE 1 TABLET (100 MG TOTAL) BY MOUTH AT BEDTIME. 30 tablet 3  . venlafaxine (EFFEXOR) 37.5 MG tablet TAKE 1 TABLET BY MOUTH TWICE A DAY 30 tablet 0   No current facility-administered medications on file prior to visit.     BP 132/89   Pulse 98  Temp 98.6 F (37 C) (Oral)   Resp 16   Ht  (1.854 m)   Wt 218 lb 12.8 oz (99.2 kg)   SpO2 99%   BMI 28.87 kg/m       Objective:   Physical Exam  General Mental Status- Alert. General Appearance- Not in acute distress.   Skin General: Color- Normal Color. Moisture- Normal Moisture. Only scattered small tiny moles on back. None worrisome on inspection.  Neck Carotid Arteries- Normal color. Moisture- Normal Moisture. No carotid bruits. No JVD.  Chest and Lung Exam Auscultation: Breath Sounds:-Normal.  Cardiovascular Auscultation:Rythm- Regular. Murmurs & Other Heart Sounds:Auscultation of the heart  reveals- No Murmurs.  Abdomen Inspection:-Inspeection Normal. Palpation/Percussion:Note:No mass. Palpation and Percussion of the abdomen reveal- Non Tender, Non Distended + BS, no rebound or guarding.    Neurologic Cranial Nerve exam:- CN III-XII intact(No nystagmus), symmetric smile. Strength:- 5/5 equal and symmetric strength both upper and lower extremities.  Genital- deferred.       Assessment & Plan:  For you wellness exam today I have ordered cbc, cmp, tsh, lipid panel, ua and hiv.  Flu vaccine given today.  Recommend exercise and healthy diet.  We will let you know lab results as they come in.  Follow up date appointment will be determined after lab review.  Recommend repeat urine in 2-3 week when well hydrated. Maybe on a Monday. Recheck for small amount of protein and blood in urine.  Cutler Sunday, Ramon Dredge, PA-C

## 2016-10-22 ENCOUNTER — Other Ambulatory Visit (INDEPENDENT_AMBULATORY_CARE_PROVIDER_SITE_OTHER): Payer: 59

## 2016-10-22 DIAGNOSIS — Z113 Encounter for screening for infections with a predominantly sexual mode of transmission: Secondary | ICD-10-CM

## 2016-10-22 DIAGNOSIS — R7989 Other specified abnormal findings of blood chemistry: Secondary | ICD-10-CM | POA: Diagnosis not present

## 2016-10-22 DIAGNOSIS — Z0000 Encounter for general adult medical examination without abnormal findings: Secondary | ICD-10-CM

## 2016-10-22 DIAGNOSIS — R809 Proteinuria, unspecified: Secondary | ICD-10-CM

## 2016-10-22 DIAGNOSIS — R319 Hematuria, unspecified: Secondary | ICD-10-CM

## 2016-10-22 LAB — CBC WITH DIFFERENTIAL/PLATELET
BASOS PCT: 0.6 % (ref 0.0–3.0)
Basophils Absolute: 0 10*3/uL (ref 0.0–0.1)
EOS PCT: 3.2 % (ref 0.0–5.0)
Eosinophils Absolute: 0.2 10*3/uL (ref 0.0–0.7)
HEMATOCRIT: 44.3 % (ref 39.0–52.0)
HEMOGLOBIN: 15.1 g/dL (ref 13.0–17.0)
LYMPHS PCT: 28.4 % (ref 12.0–46.0)
Lymphs Abs: 1.9 10*3/uL (ref 0.7–4.0)
MCHC: 34.1 g/dL (ref 30.0–36.0)
MCV: 96.9 fl (ref 78.0–100.0)
MONO ABS: 0.5 10*3/uL (ref 0.1–1.0)
MONOS PCT: 7 % (ref 3.0–12.0)
Neutro Abs: 4.1 10*3/uL (ref 1.4–7.7)
Neutrophils Relative %: 60.8 % (ref 43.0–77.0)
Platelets: 252 10*3/uL (ref 150.0–400.0)
RBC: 4.57 Mil/uL (ref 4.22–5.81)
RDW: 12.9 % (ref 11.5–15.5)
WBC: 6.7 10*3/uL (ref 4.0–10.5)

## 2016-10-22 LAB — COMPREHENSIVE METABOLIC PANEL
ALBUMIN: 4.2 g/dL (ref 3.5–5.2)
ALK PHOS: 77 U/L (ref 39–117)
ALT: 61 U/L — ABNORMAL HIGH (ref 0–53)
AST: 48 U/L — ABNORMAL HIGH (ref 0–37)
BUN: 15 mg/dL (ref 6–23)
CHLORIDE: 103 meq/L (ref 96–112)
CO2: 26 mEq/L (ref 19–32)
Calcium: 9.2 mg/dL (ref 8.4–10.5)
Creatinine, Ser: 1.03 mg/dL (ref 0.40–1.50)
GFR: 86.97 mL/min (ref >60.00)
Glucose, Bld: 98 mg/dL (ref 70–99)
POTASSIUM: 4.3 meq/L (ref 3.5–5.1)
Sodium: 136 mEq/L (ref 135–145)
Total Bilirubin: 0.6 mg/dL (ref 0.2–1.2)
Total Protein: 7.3 g/dL (ref 6.0–8.3)

## 2016-10-22 LAB — LIPID PANEL
CHOL/HDL RATIO: 4
CHOLESTEROL: 178 mg/dL (ref 0–200)
HDL: 41.8 mg/dL (ref >39.00)

## 2016-10-22 LAB — LDL CHOLESTEROL, DIRECT: Direct LDL: 81 mg/dL

## 2016-10-22 NOTE — Addendum Note (Signed)
Addended by: Harley Alto on: 10/22/2016 11:14 AM   Modules accepted: Orders

## 2016-10-23 LAB — HIV ANTIBODY (ROUTINE TESTING W REFLEX): HIV 1&2 Ab, 4th Generation: NONREACTIVE

## 2016-10-24 ENCOUNTER — Telehealth: Payer: Self-pay | Admitting: Medical

## 2016-10-24 ENCOUNTER — Encounter: Payer: Self-pay | Admitting: Medical

## 2016-10-24 MED ORDER — FENOFIBRATE 48 MG PO TABS: 48.0000 mg | ORAL_TABLET | Freq: Every day | ORAL | 3 refills | Status: DC

## 2016-10-24 NOTE — Telephone Encounter (Signed)
Prescription of fenofibrate sent to the pharmacy.

## 2016-10-25 ENCOUNTER — Telehealth: Payer: Self-pay

## 2016-10-25 DIAGNOSIS — E785 Hyperlipidemia, unspecified: Secondary | ICD-10-CM

## 2016-10-25 NOTE — Telephone Encounter (Signed)
Pt aware of lab results/agrees to start the Fenofibrate and recheck fasting lipid panel in 3 months/created future order for fasting Lipid with Dx: Hyperlipidemia/thx dmf

## 2016-10-25 NOTE — Telephone Encounter (Signed)
-----   Message from Esperanza Richters, PA-C sent at 10/24/2016  8:29 PM EDT ----- Your HIV test was negative. Metabolic panel looks good. Liver enzymes are still slightly elevated but less than before. Limiting alcohol intake will be beneficial to protect liver. Sugar level is good. No anemia and normal infection fighting cells. Lipid panel looks good but triglycerides are very high. Triglycerides over 441. Last time your only minimally elevated. So recommend low cholesterol diet, reduce fat in diet and processed carbohydrates. But triglycerides are so high that I would like to go ahead and prescribed fenofibrate. Then repeat fasting lipid panel in 3 months.

## 2016-10-27 ENCOUNTER — Telehealth: Payer: Self-pay | Admitting: Medical

## 2016-10-27 NOTE — Telephone Encounter (Signed)
I filled out patient's form. But it looks like he has one more thing to do. The form has waist circumference. I did not measure that. I don't think he had the form on the day of exam. So you can just asked him come in and if  would measure his waist at the level of the umbilicus. Right down the waist circumference in inches and give him of the physical exam with original.

## 2016-10-28 NOTE — Telephone Encounter (Signed)
Pt picked up document.

## 2016-11-02 ENCOUNTER — Other Ambulatory Visit: Payer: Self-pay

## 2016-11-02 MED ORDER — FENOFIBRATE 54 MG PO TABS: 54.0000 mg | ORAL_TABLET | Freq: Every day | ORAL | 0 refills | Status: DC

## 2016-11-02 NOTE — Telephone Encounter (Signed)
Ins will not cover  of Fenofibrate/I called pharm and they will cover 54 or /Per ES ok to send in /faxed/thx dmf

## 2016-12-05 ENCOUNTER — Other Ambulatory Visit: Payer: Self-pay | Admitting: Medical

## 2017-08-03 ENCOUNTER — Other Ambulatory Visit: Payer: Self-pay | Admitting: Medical

## 2017-08-03 NOTE — Telephone Encounter (Signed)
Pt due for follow up please call and schedule appointment.  

## 2017-08-03 NOTE — Telephone Encounter (Signed)
LVM for pt to call the office and schedule his fu appt.

## 2018-03-02 ENCOUNTER — Emergency Department (HOSPITAL_BASED_OUTPATIENT_CLINIC_OR_DEPARTMENT_OTHER): Payer: Managed Care, Other (non HMO)

## 2018-03-02 ENCOUNTER — Other Ambulatory Visit: Payer: Self-pay

## 2018-03-02 ENCOUNTER — Emergency Department (HOSPITAL_BASED_OUTPATIENT_CLINIC_OR_DEPARTMENT_OTHER)
Admission: EM | Admit: 2018-03-02 | Discharge: 2018-03-02 | Disposition: A | Discharge status: Home / Self Care | Payer: Managed Care, Other (non HMO) | Attending: Emergency Medicine | Admitting: Emergency Medicine

## 2018-03-02 ENCOUNTER — Encounter (HOSPITAL_BASED_OUTPATIENT_CLINIC_OR_DEPARTMENT_OTHER): Payer: Self-pay | Admitting: *Deleted

## 2018-03-02 DIAGNOSIS — Z79899 Other long term (current) drug therapy: Secondary | ICD-10-CM | POA: Diagnosis not present

## 2018-03-02 DIAGNOSIS — I10 Essential (primary) hypertension: Secondary | ICD-10-CM | POA: Diagnosis not present

## 2018-03-02 DIAGNOSIS — R0789 Other chest pain: Secondary | ICD-10-CM | POA: Diagnosis present

## 2018-03-02 HISTORY — DX: Essential (primary) hypertension: I10

## 2018-03-02 LAB — BASIC METABOLIC PANEL
Anion gap: 9 (ref 5–15)
BUN: 16 mg/dL (ref 6–20)
CALCIUM: 9.1 mg/dL (ref 8.9–10.3)
CO2: 25 mmol/L (ref 22–32)
Chloride: 100 mmol/L (ref 98–111)
Creatinine, Ser: 0.89 mg/dL (ref 0.61–1.24)
GFR calc Af Amer: >60 mL/min (ref >60)
Glucose, Bld: 125 mg/dL — ABNORMAL HIGH (ref 70–99)
Potassium: 4 mmol/L (ref 3.5–5.1)
Sodium: 134 mmol/L — ABNORMAL LOW (ref 135–145)

## 2018-03-02 LAB — CBC
HCT: 46.9 % (ref 39.0–52.0)
Hemoglobin: 15.9 g/dL (ref 13.0–17.0)
MCH: 32 pg (ref 26.0–34.0)
MCHC: 33.9 g/dL (ref 30.0–36.0)
MCV: 94.4 fL (ref 80.0–100.0)
PLATELETS: 268 10*3/uL (ref 150–400)
RBC: 4.97 MIL/uL (ref 4.22–5.81)
RDW: 12.3 % (ref 11.5–15.5)
WBC: 8.1 10*3/uL (ref 4.0–10.5)
nRBC: 0 % (ref 0.0–0.2)

## 2018-03-02 LAB — TROPONIN I

## 2018-03-02 MED ORDER — SODIUM CHLORIDE 0.9% FLUSH
3.0000 mL | Freq: Once | INTRAVENOUS | Status: DC
  Filled 2018-03-02: qty 3

## 2018-03-02 NOTE — ED Notes (Signed)
Pt c/o chest pressure to upper L chest that is non-radiating. Pt states pain is constant and nothing makes it better or worse. Pt having associated ShOB. Denies cough.

## 2018-03-02 NOTE — ED Triage Notes (Signed)
Pt c/o left sided chest pain and SOB x 4 hrs. Increased stress

## 2018-03-02 NOTE — ED Provider Notes (Signed)
[facility] EMERGENCY DEPARTMENT Provider Note   CSN: [id number] Arrival date & time: 03/02/18  1508     History   Chief Complaint Chief Complaint  Patient presents with  . Chest Pain    HPI Robert Sellers is a 38 y.o. male with a past medical history significant for hypertension who presents today complaining of left-sided chest pain.  Patient reports that chest pain started this morning around 10 AM.  It is sharp in nature does not radiate to his arm or jaw.  Pain is localized to his left upper pectoralis muscle under his clavicle.  Patient has not taken any medication for it.  Patient has had similar pain in the past which lasted about an hour and self resolved.  He denies any nausea, vomiting, diaphoresis, abdominal pain, dizziness or shortness of breath.  Patient has been adherent to his blood pressure medication.  He is currently taking lisinopril.  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  He denies any recent injury or trauma to the area.  HPI  Past Medical History:  Diagnosis Date  . Hyperlipidemia 12/04/2012  . Hypertension     Patient Active Problem List   Diagnosis Date Noted  . Left shoulder pain 08/28/2015  . Hyperlipidemia 12/04/2012    Past Surgical History:  Procedure Laterality Date  . KNEE SURGERY          Home Medications    Prior to Admission medications   Medication Sig Start Date End Date Taking? Authorizing Provider  losartan (COZAAR) 100 MG tablet TAKE 1 TABLET BY MOUTH EVERY DAY 08/19/16   Saguier, Ramon DredgeEdward, PA-C  Multiple Vitamin (MULTIVITAMIN) tablet Take 1 tablet by mouth daily.    [provider]  nitroGLYCERIN (NITRODUR - DOSED IN MG/24 HR) 0.2 mg/hr patch Apply 1/4th patch to affected shoulder, change daily 08/26/15   Hudnall, Azucena FallenShane R, MD  ranitidine (ZANTAC) 150 MG capsule TAKE 1 CAPSULE BY MOUTH TWICE A DAY 08/19/16   Saguier, Ramon DredgeEdward, PA-C    Family History Family  History  Problem Relation Age of Onset  . Heart disease Mother   . Hyperlipidemia Mother   . Hypertension Mother   . Diabetes Mother   . Heart disease Father   . Hyperlipidemia Father   . Hypertension Father   . Diabetes Father     Social History Social History   Tobacco Use  . Smoking status: Never Smoker  . Smokeless tobacco: Never Used  Substance Use Topics  . Alcohol use: Yes    Alcohol/week: 3.0 standard drinks    Types: 3 Standard drinks or equivalent per week  . Drug use: No     Allergies   Patient has no known allergies.   Review of Systems Review of Systems  Constitutional: Negative.   HENT: Negative.   Eyes: Negative.   Respiratory: Negative.   Cardiovascular: Positive for chest pain.  Gastrointestinal: Negative.   Genitourinary: Negative.   Musculoskeletal: Negative.   Neurological: Negative.     Physical Exam Updated Vital Signs BP (!) 151/109   Pulse 90   Temp 98.4 F (36.9 C)   Resp 18   Ht 5\' 11"  (1.803 m)   Wt 104.3 kg   SpO2 99%   BMI 32.08 kg/m   Physical Exam Constitutional:      Appearance: He is well-developed and normal weight.  HENT:     Head: Normocephalic and atraumatic.  Eyes:  Extraocular Movements: Extraocular movements intact.     Pupils: Pupils are equal, round, and reactive to light.  Neck:     Musculoskeletal: Normal range of motion and neck supple.  Cardiovascular:     Rate and Rhythm: Normal rate and regular rhythm.     Pulses:          Carotid pulses are 2+ on the right side and 2+ on the left side.      Radial pulses are 2+ on the right side and 2+ on the left side.       Dorsalis pedis pulses are 2+ on the right side and 2+ on the left side.       Posterior tibial pulses are 2+ on the right side and 2+ on the left side.     Heart sounds: Normal heart sounds.  Pulmonary:     Effort: Pulmonary effort is normal.     Breath sounds: Normal breath sounds.  Chest:     Chest wall: Tenderness present.      Comments: Left pectoralis muscle tenderness under clavicle Abdominal:     General: Bowel sounds are normal.     Palpations: Abdomen is soft.  Musculoskeletal: Normal range of motion.  Skin:    General: Skin is warm and dry.     Capillary Refill: Capillary refill takes less than 2 seconds.  Neurological:     General: No focal deficit present.     Mental Status: He is alert and oriented to person, place, and time.  Psychiatric:        Mood and Affect: Mood normal.        Behavior: Behavior normal.     ED Treatments / Results  Labs (all labs ordered are listed, but only abnormal results are displayed) Labs Reviewed  BASIC METABOLIC PANEL - Abnormal; Notable for the following components:      Result Value   Sodium 134 (*)    Glucose, Bld 125 (*)    All other components within normal limits  CBC  TROPONIN I    EKG EKG Interpretation  Date/Time:  Thursday March 02 2018 15:15:06 EST Ventricular Rate:  96 PR Interval:  154 QRS Duration: 98 QT Interval:  356 QTC Calculation: 449 R Axis:   119 Text Interpretation:  Normal sinus rhythm Incomplete right bundle branch block Left posterior fascicular block Nonspecific T wave abnormality Abnormal ECG No previous ECGs available Confirmed by Richardean Canal [phone]) on 03/02/2018 3:52:12 PM   Radiology Dg Chest 2 View  Result Date: 03/02/2018 CLINICAL DATA:  Left upper chest pain. EXAM: CHEST - 2 VIEW COMPARISON:  None. FINDINGS: The heart size and mediastinal contours are within normal limits. Both lungs are clear. The visualized skeletal structures are unremarkable. IMPRESSION: No active cardiopulmonary disease. Electronically Signed   By: Gerome Sam III M.D   On: 03/02/2018 15:41    Procedures Procedures (including critical care time)  Medications Ordered in ED Medications  sodium chloride flush (NS) 0.9 % injection 3 mL (3 mLs Intravenous Not Given 03/02/18 1544)    Initial Impression / Assessment and Plan / ED Course  I  have reviewed the triage vital signs and the nursing notes.  Pertinent labs & imaging results that were available during my care of the patient were reviewed by me and considered in my medical decision making (see chart for details).   Patient is a 38 year old male who presented with left sided chest pain for the past 6 hours nonradiating and  not exacerbated with exertion.  Pain is localized to his left upper chest.  No other red flags such as diaphoresis numbness tingling of his left arm nausea vomiting.  Hypertension is his only risk factor.  He does have a family history of myocardial infarction.  Troponin < 0.03 and EKG showed normal sinus rhythm with possible right bundle branch block.  Vital signs, CMP, CBC all within normal limits.  Chest atypical in nature and likely musculoskeletal given reproducibility on palpation.  Patient will continue with NSAIDs for the next few days for pain control.  If symptoms do not improve or worsen, he may need a stress test in the outpatient setting with cardiology.  He can follow-up with PCP as needed for referral.  Patient is in agreement with plan and is stable for discharge.  Final Clinical Impressions(s) / ED Diagnoses   Final diagnoses:  Atypical chest pain    ED Discharge Orders    None       Lovena Neighboursiallo, Charisa Twitty, MD 03/02/18 1640    Charlynne PanderYao, Javon Hsienta, MD 03/04/18 1455

## 2018-03-02 NOTE — Discharge Instructions (Addendum)
All your tests today are reassuring for a serious cardiac problems, however given your family history if pain persists despite motrin for the next few we recommend you see a cardiologist. Pain you are experiencing is most likely musculoskeletal in nature.

## 2018-05-09 ENCOUNTER — Telehealth: Payer: Self-pay | Admitting: Medical

## 2018-05-09 NOTE — Telephone Encounter (Signed)
MyChart message--virtual visit?

## 2018-05-09 NOTE — Telephone Encounter (Signed)
Sept 2018 since last saw pt. He has htn. Was on losartan. Offer virtual visit. Has he checked his blood pressure recently?

## 2019-04-19 IMAGING — DX DG FINGER INDEX 2+V*R*
3 series · 3 of 3 positions shown · non-contrast
Comparison: No prior .

CLINICAL DATA: Injury.  Abrasion.

EXAM:
RIGHT INDEX FINGER 2+V

[finger ap]
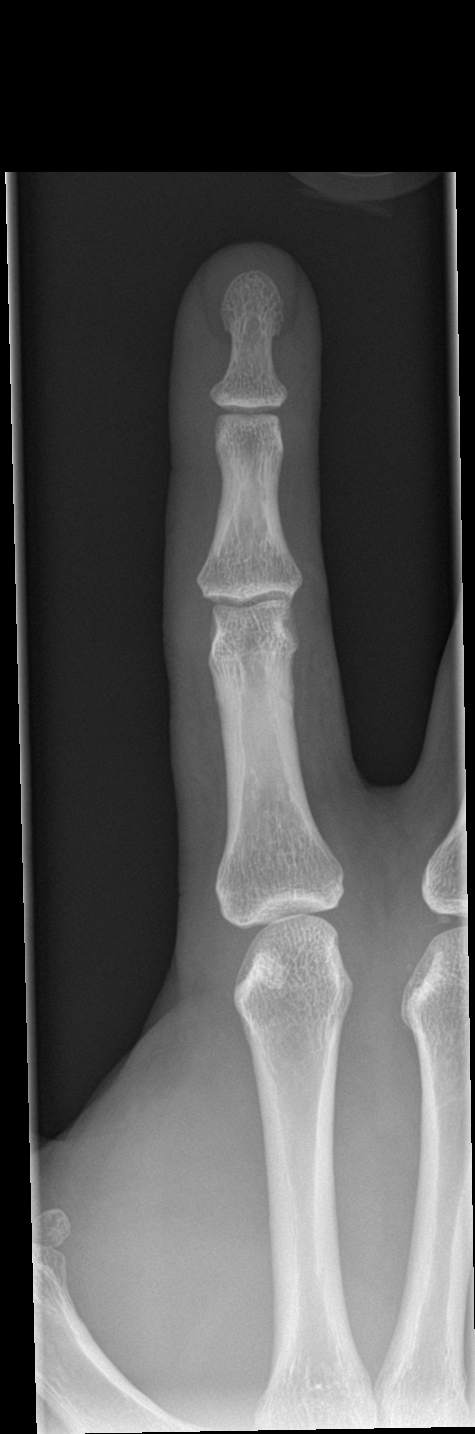

[finger obl]
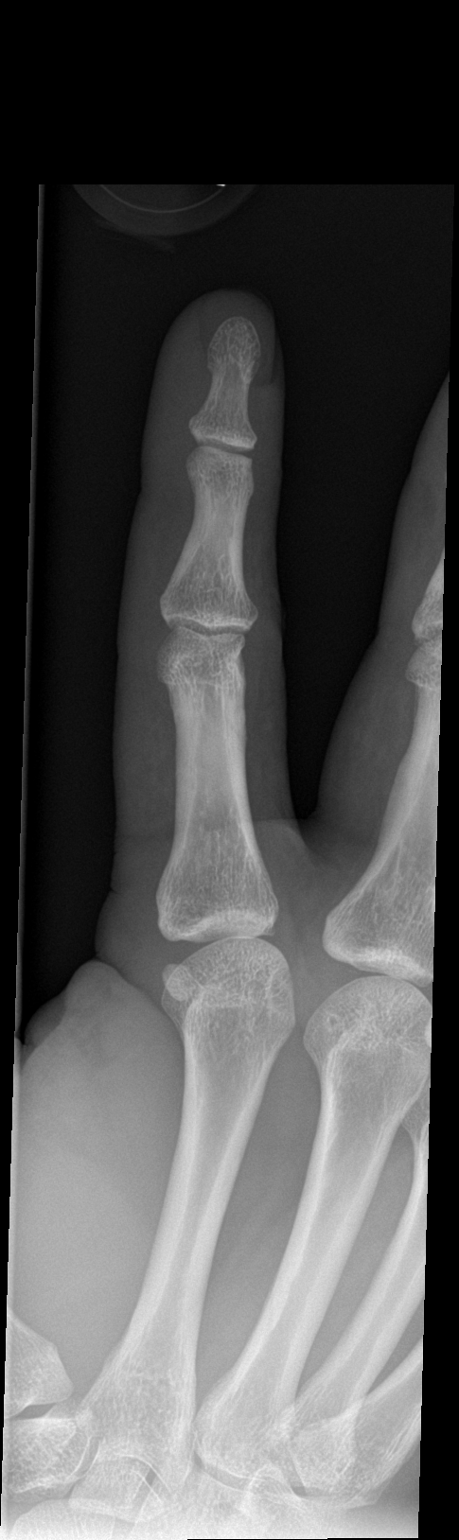

[finger lat]
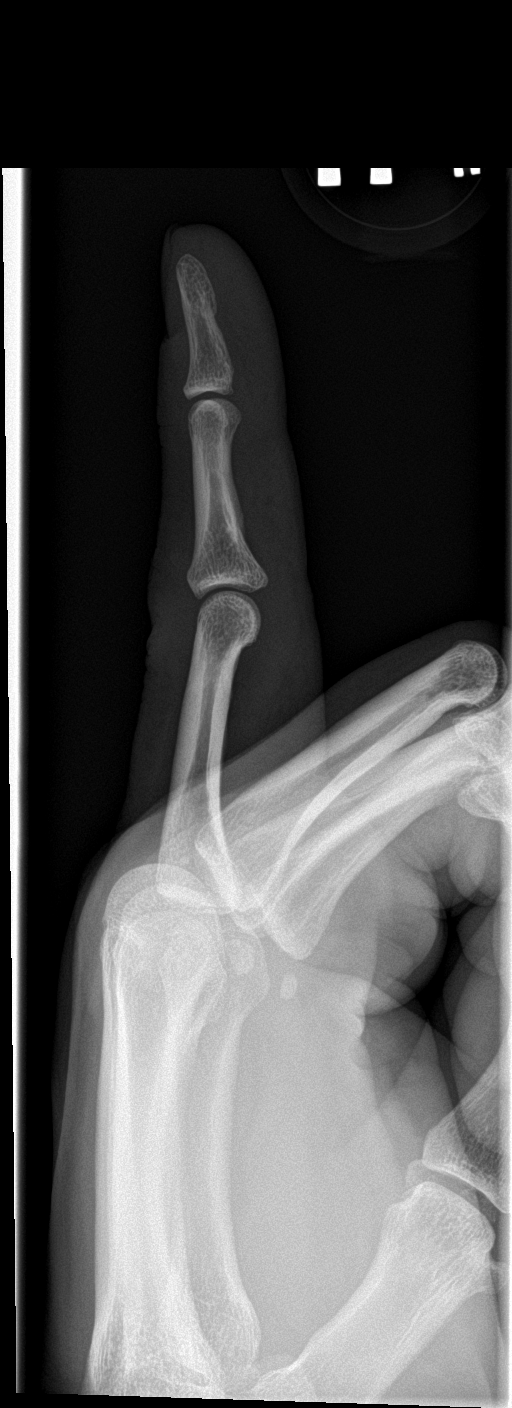

[3 of 3 positions shown; findings below may reference images not displayed]

FINDINGS: No acute bony abnormality identified. No evidence of fracture. No
radiopaque foreign body .
IMPRESSION: No acute or focal abnormality.

## 2019-06-04 DIAGNOSIS — N210 Calculus in bladder: Secondary | ICD-10-CM | POA: Insufficient documentation

## 2021-01-29 IMAGING — CR DG CHEST 2V
2 series · 2 of 2 positions shown · non-contrast
Comparison: None.

CLINICAL DATA: Left upper chest pain.

EXAM:
CHEST - 2 VIEW

[w chest pa]
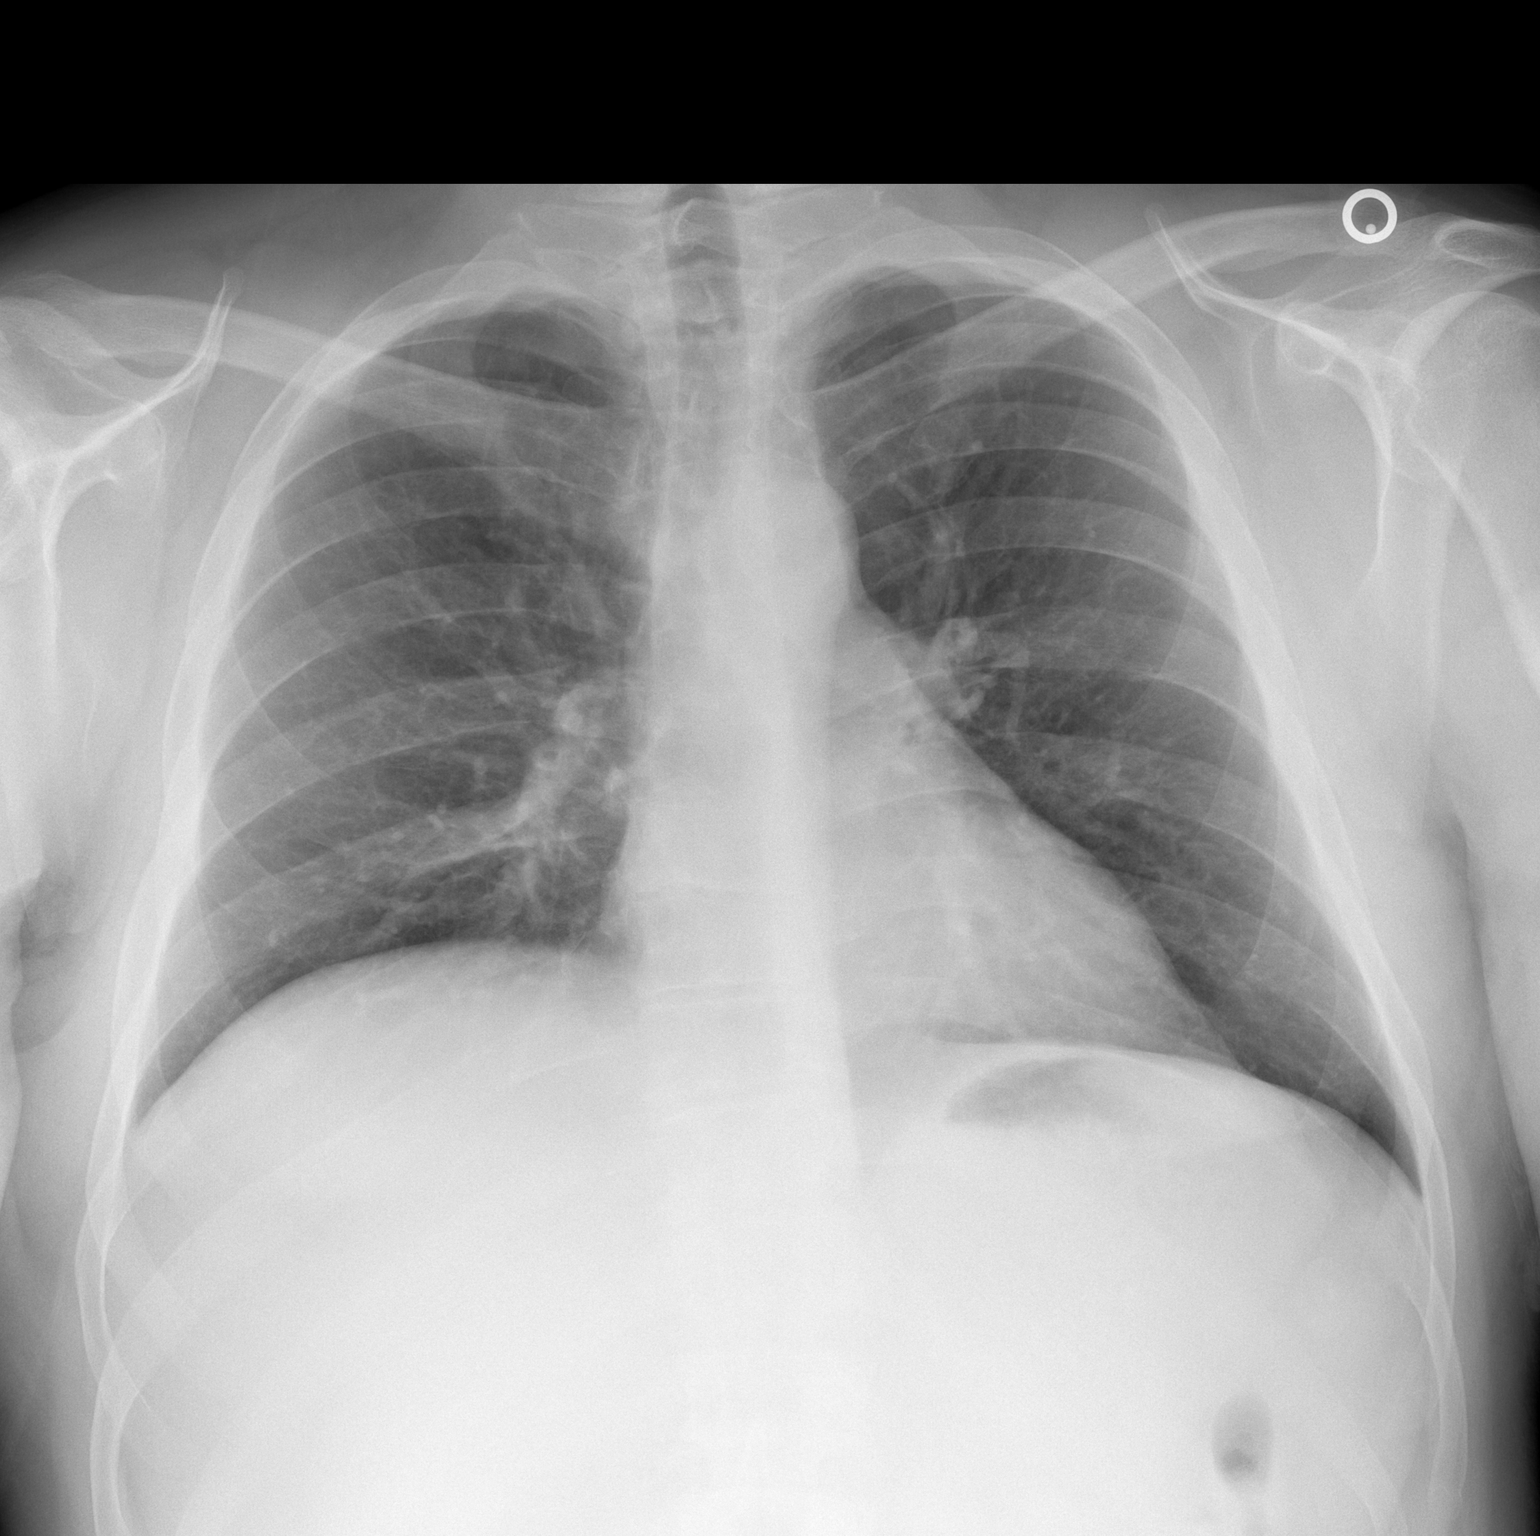

[w chest lat]
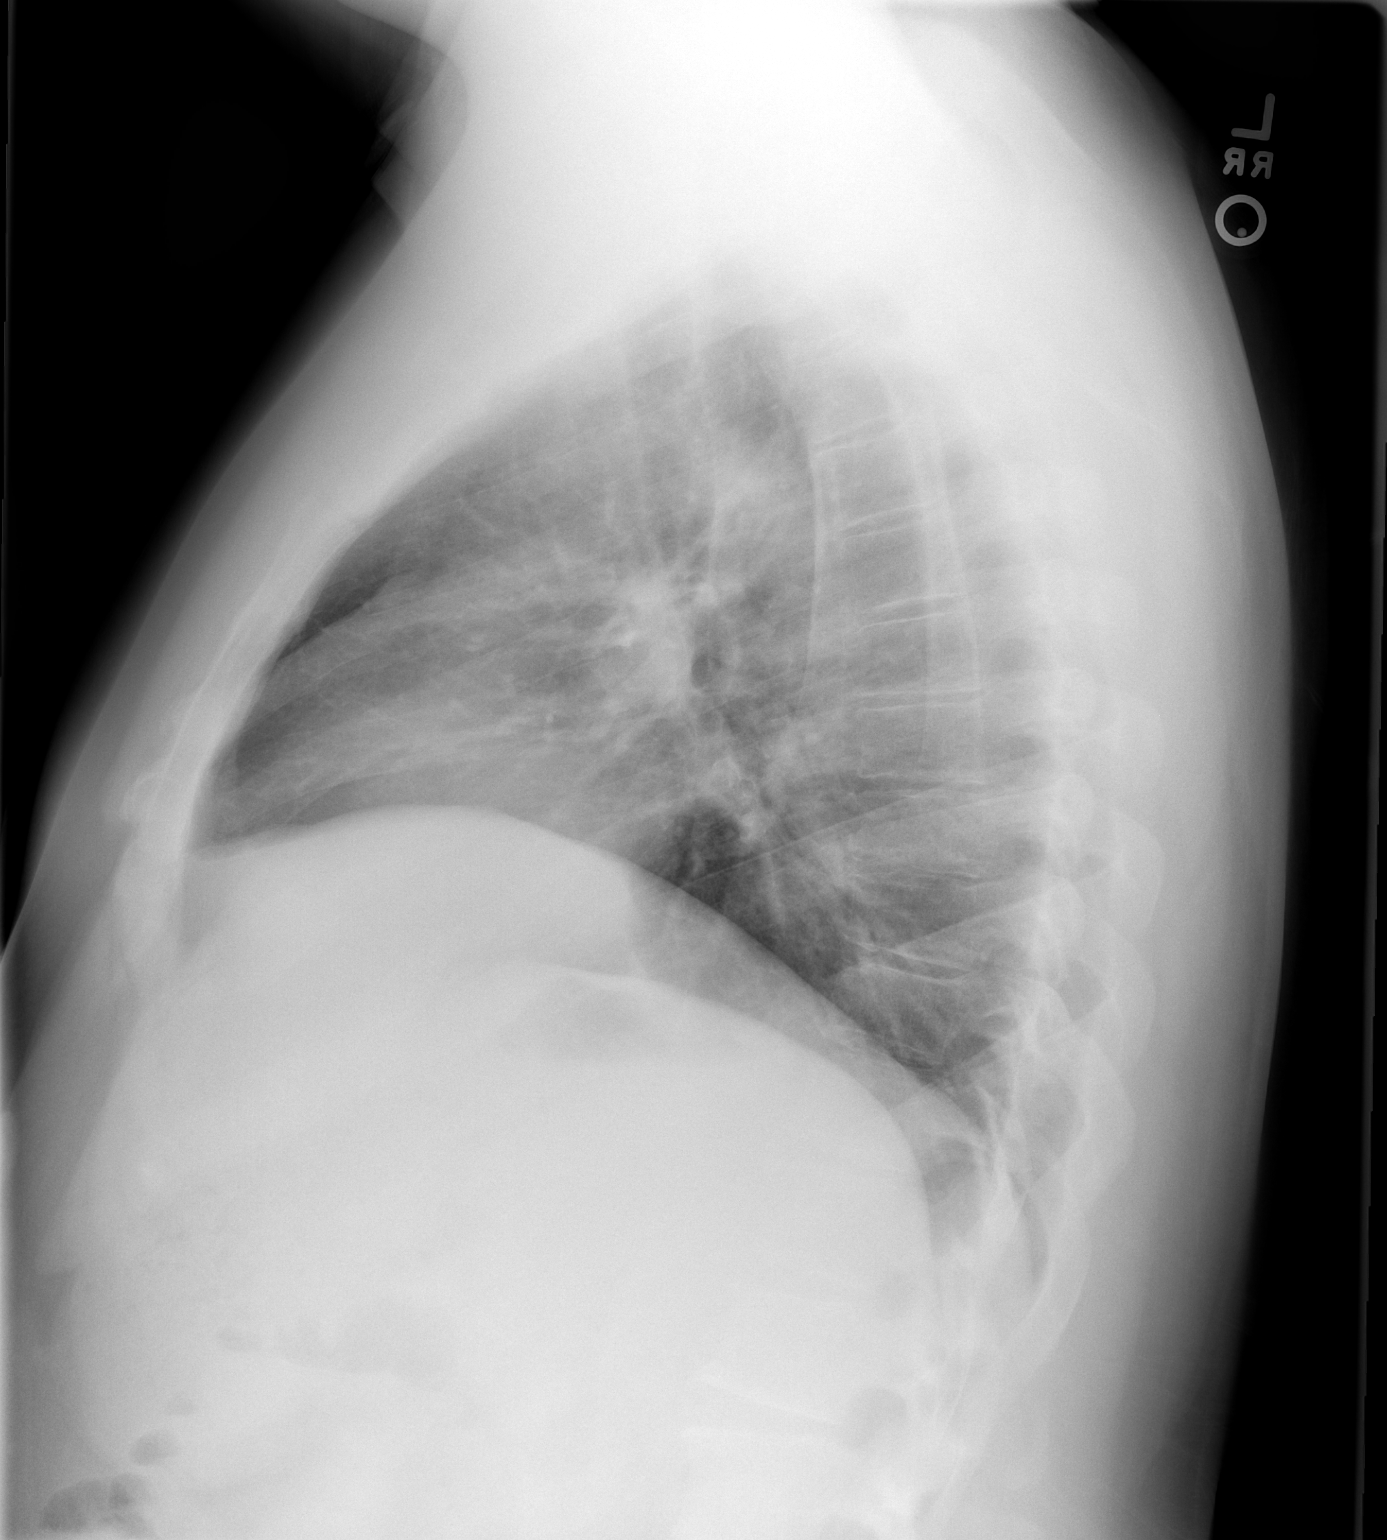

[2 of 2 positions shown; findings below may reference images not displayed]

FINDINGS: The heart size and mediastinal contours are within normal limits.
Both lungs are clear. The visualized skeletal structures are
unremarkable.
IMPRESSION: No active cardiopulmonary disease.

## 2022-02-01 HISTORY — PX: OTHER SURGICAL HISTORY: SHX169

## 2022-09-29 ENCOUNTER — Emergency Department: Payer: Self-pay

## 2022-09-29 ENCOUNTER — Other Ambulatory Visit: Payer: Self-pay

## 2022-09-29 ENCOUNTER — Inpatient Hospital Stay
Admission: EM | Admit: 2022-09-29 | Discharge: 2022-10-01 | DRG: 432 | Disposition: A | Discharge status: Home / Self Care | Payer: Managed Care, Other (non HMO) | Attending: Internal Medicine | Admitting: Internal Medicine

## 2022-09-29 DIAGNOSIS — K7030 Alcoholic cirrhosis of liver without ascites: Principal | ICD-10-CM | POA: Diagnosis present

## 2022-09-29 DIAGNOSIS — F1010 Alcohol abuse, uncomplicated: Secondary | ICD-10-CM | POA: Diagnosis present

## 2022-09-29 DIAGNOSIS — I8501 Esophageal varices with bleeding: Principal | ICD-10-CM

## 2022-09-29 DIAGNOSIS — I10 Essential (primary) hypertension: Secondary | ICD-10-CM | POA: Diagnosis present

## 2022-09-29 DIAGNOSIS — I8511 Secondary esophageal varices with bleeding: Secondary | ICD-10-CM | POA: Diagnosis present

## 2022-09-29 DIAGNOSIS — F32A Depression, unspecified: Secondary | ICD-10-CM | POA: Diagnosis present

## 2022-09-29 DIAGNOSIS — R9431 Abnormal electrocardiogram [ECG] [EKG]: Secondary | ICD-10-CM | POA: Diagnosis present

## 2022-09-29 DIAGNOSIS — K3189 Other diseases of stomach and duodenum: Secondary | ICD-10-CM | POA: Diagnosis present

## 2022-09-29 DIAGNOSIS — E876 Hypokalemia: Secondary | ICD-10-CM | POA: Diagnosis present

## 2022-09-29 DIAGNOSIS — E785 Hyperlipidemia, unspecified: Secondary | ICD-10-CM | POA: Diagnosis present

## 2022-09-29 DIAGNOSIS — F419 Anxiety disorder, unspecified: Secondary | ICD-10-CM | POA: Diagnosis present

## 2022-09-29 DIAGNOSIS — D62 Acute posthemorrhagic anemia: Secondary | ICD-10-CM | POA: Diagnosis present

## 2022-09-29 DIAGNOSIS — R161 Splenomegaly, not elsewhere classified: Secondary | ICD-10-CM | POA: Diagnosis present

## 2022-09-29 DIAGNOSIS — K922 Gastrointestinal hemorrhage, unspecified: Secondary | ICD-10-CM | POA: Diagnosis present

## 2022-09-29 DIAGNOSIS — D696 Thrombocytopenia, unspecified: Secondary | ICD-10-CM | POA: Diagnosis present

## 2022-09-29 DIAGNOSIS — R000 Tachycardia, unspecified: Secondary | ICD-10-CM | POA: Diagnosis present

## 2022-09-29 DIAGNOSIS — Z8249 Family history of ischemic heart disease and other diseases of the circulatory system: Secondary | ICD-10-CM

## 2022-09-29 DIAGNOSIS — K766 Portal hypertension: Secondary | ICD-10-CM | POA: Diagnosis present

## 2022-09-29 DIAGNOSIS — Z83438 Family history of other disorder of lipoprotein metabolism and other lipidemia: Secondary | ICD-10-CM

## 2022-09-29 DIAGNOSIS — Z79899 Other long term (current) drug therapy: Secondary | ICD-10-CM

## 2022-09-29 HISTORY — DX: Gastrointestinal hemorrhage, unspecified: K92.2

## 2022-09-29 LAB — COMPREHENSIVE METABOLIC PANEL
ALT: 49 U/L — ABNORMAL HIGH (ref 0–44)
AST: 148 U/L — ABNORMAL HIGH (ref 15–41)
Albumin: 3.9 g/dL (ref 3.5–5.0)
Alkaline Phosphatase: 98 U/L (ref 38–126)
Anion gap: 16 — ABNORMAL HIGH (ref 5–15)
BUN: 9 mg/dL (ref 6–20)
CO2: 21 mmol/L — ABNORMAL LOW (ref 22–32)
Calcium: 9.1 mg/dL (ref 8.9–10.3)
Chloride: 99 mmol/L (ref 98–111)
Creatinine, Ser: 0.84 mg/dL (ref 0.61–1.24)
GFR, Estimated: >60 mL/min (ref >60)
Glucose, Bld: 146 mg/dL — ABNORMAL HIGH (ref 70–99)
Potassium: 3.4 mmol/L — ABNORMAL LOW (ref 3.5–5.1)
Sodium: 136 mmol/L (ref 135–145)
Total Bilirubin: 3.7 mg/dL — ABNORMAL HIGH (ref 0.3–1.2)
Total Protein: 8.4 g/dL — ABNORMAL HIGH (ref 6.5–8.1)

## 2022-09-29 LAB — TYPE AND SCREEN
ABO/RH(D): O POS
Antibody Screen: NEGATIVE

## 2022-09-29 LAB — CBC
HCT: 41.4 % (ref 39.0–52.0)
Hemoglobin: 14.5 g/dL (ref 13.0–17.0)
MCH: 33.1 pg (ref 26.0–34.0)
MCHC: 35 g/dL (ref 30.0–36.0)
MCV: 94.5 fL (ref 80.0–100.0)
Platelets: 89 10*3/uL — ABNORMAL LOW (ref 150–400)
RBC: 4.38 MIL/uL (ref 4.22–5.81)
RDW: 13.4 % (ref 11.5–15.5)
WBC: 6.3 10*3/uL (ref 4.0–10.5)
nRBC: 0 % (ref 0.0–0.2)

## 2022-09-29 LAB — HEMOGLOBIN AND HEMATOCRIT, BLOOD
HCT: 35.7 % — ABNORMAL LOW (ref 39.0–52.0)
Hemoglobin: 12.5 g/dL — ABNORMAL LOW (ref 13.0–17.0)

## 2022-09-29 LAB — PROTIME-INR
INR: 1.2 (ref 0.8–1.2)
Prothrombin Time: 15.8 seconds — ABNORMAL HIGH (ref 11.4–15.2)

## 2022-09-29 LAB — APTT: aPTT: 29 seconds (ref 24–36)

## 2022-09-29 MED ORDER — SODIUM CHLORIDE 0.9 % IV SOLN
1.0000 g | Freq: Once | INTRAVENOUS | Status: AC
  Administered 2022-09-29: 1 g via INTRAVENOUS
  Filled 2022-09-29: qty 10

## 2022-09-29 MED ORDER — SODIUM CHLORIDE 0.9 % IV SOLN: INTRAVENOUS | Status: DC

## 2022-09-29 MED ORDER — PANTOPRAZOLE INFUSION (NEW) - SIMPLE MED
8.0000 mg/h | INTRAVENOUS | Status: DC
  Administered 2022-09-29 – 2022-10-01 (×4): 8 mg/h via INTRAVENOUS
  Filled 2022-09-29 (×5): qty 100

## 2022-09-29 MED ORDER — PANTOPRAZOLE SODIUM 40 MG IV SOLR: 40.0000 mg | Freq: Two times a day (BID) | INTRAVENOUS | Status: DC

## 2022-09-29 MED ORDER — ACETAMINOPHEN 650 MG RE SUPP: 650.0000 mg | Freq: Four times a day (QID) | RECTAL | Status: DC | PRN

## 2022-09-29 MED ORDER — TRAZODONE HCL 50 MG PO TABS
25.0000 mg | ORAL_TABLET | Freq: Every evening | ORAL | Status: DC | PRN
  Administered 2022-09-29 – 2022-10-01 (×2): 25 mg via ORAL
  Filled 2022-09-29 (×2): qty 1

## 2022-09-29 MED ORDER — ACETAMINOPHEN 325 MG PO TABS
650.0000 mg | ORAL_TABLET | Freq: Four times a day (QID) | ORAL | Status: DC | PRN
  Administered 2022-09-29 – 2022-10-01 (×2): 650 mg via ORAL
  Filled 2022-09-29 (×2): qty 2

## 2022-09-29 MED ORDER — SODIUM CHLORIDE 0.9 % IV SOLN
50.0000 ug/h | INTRAVENOUS | Status: DC
  Administered 2022-09-29 – 2022-09-30 (×2): 50 ug/h via INTRAVENOUS
  Filled 2022-09-29 (×4): qty 1

## 2022-09-29 MED ORDER — IOHEXOL 300 MG/ML  SOLN
100.0000 mL | Freq: Once | INTRAMUSCULAR | Status: AC | PRN
  Administered 2022-09-29: 100 mL via INTRAVENOUS

## 2022-09-29 MED ORDER — OCTREOTIDE LOAD VIA INFUSION
50.0000 ug | Freq: Once | INTRAVENOUS | Status: AC
  Administered 2022-09-29: 50 ug via INTRAVENOUS
  Filled 2022-09-29: qty 25

## 2022-09-29 MED ORDER — SODIUM CHLORIDE 0.9 % IV BOLUS
1000.0000 mL | Freq: Once | INTRAVENOUS | Status: AC
  Administered 2022-09-29: 1000 mL via INTRAVENOUS

## 2022-09-29 MED ORDER — ONDANSETRON HCL 4 MG PO TABS: 4.0000 mg | ORAL_TABLET | Freq: Four times a day (QID) | ORAL | Status: DC | PRN

## 2022-09-29 MED ORDER — PANTOPRAZOLE 80MG IVPB - SIMPLE MED
80.0000 mg | Freq: Once | INTRAVENOUS | Status: AC
  Administered 2022-09-29: 80 mg via INTRAVENOUS
  Filled 2022-09-29: qty 100

## 2022-09-29 MED ORDER — ONDANSETRON HCL 4 MG/2ML IJ SOLN
4.0000 mg | Freq: Four times a day (QID) | INTRAMUSCULAR | Status: DC | PRN
  Administered 2022-09-29: 4 mg via INTRAVENOUS
  Filled 2022-09-29: qty 2

## 2022-09-29 MED ORDER — ONDANSETRON HCL 4 MG/2ML IJ SOLN
4.0000 mg | Freq: Once | INTRAMUSCULAR | Status: AC
  Administered 2022-09-29: 4 mg via INTRAVENOUS
  Filled 2022-09-29: qty 2

## 2022-09-29 NOTE — ED Provider Notes (Signed)
[facility] Provider Note    Event Date/Time   First MD Initiated Contact with Patient 09/29/22 1752     (approximate)   History   Hematemesis   HPI  Robert Sellers is a 42 y.o. male with history of heavy alcohol use, HTN presenting to the emergency department for evaluation of hematemesis.  Around 3 PM today, patient had onset of multiple episodes of hematemesis.  Denies history of similar.  Does report some generalized abdominal pain worse in his left lower quadrant.  No noted blood in stool.  Not on anticoagulation.  Last drink was around 6 PM last night.  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     Physical Exam   Triage Vital Signs: ED Triage Vitals [09/29/22 1752]  Encounter Vitals Group     BP (!) 144/83     Systolic BP Percentile      Diastolic BP Percentile      Pulse Rate (!) 120     Resp 20     Temp 97.9 F (36.6 C)     Temp Source Axillary     SpO2 100 %     Weight      Height      Head Circumference      Peak Flow      Pain Score 3     Pain Loc      Pain Education      Exclude from Growth Chart     Most recent vital signs: Vitals:   09/29/22 2134 09/29/22 2210  BP:  128/72  Pulse: 72 67  Resp: 19 18  Temp:  98.8 F (37.1 C)  SpO2: 97% 97%     General: Awake, interactive  CV:  Tachycardic with regular rhythm, normal peripheral perfusion Resp:  Lungs clear, unlabored respirations.  Abd:  Soft, nondistended, mild generalized tenderness to palpation most prominent in left lower quadrant, active episode of hematemesis during my evaluation, dark red in color Neuro:  Symmetric facial movement, fluid speech   ED Results / Procedures / Treatments   Labs (all labs ordered are listed, but only abnormal results are displayed) Labs Reviewed  COMPREHENSIVE METABOLIC PANEL - Abnormal; Notable for the following components:      Result Value    Potassium 3.4 (*)    CO2 21 (*)    Glucose, Bld 146 (*)    Total Protein 8.4 (*)    AST 148 (*)    ALT 49 (*)    Total Bilirubin 3.7 (*)    Anion gap 16 (*)    All other components within normal limits  CBC - Abnormal; Notable for the following components:   Platelets 89 (*)    All other components within normal limits  PROTIME-INR - Abnormal; Notable for the following components:   Prothrombin Time 15.8 (*)    All other components within normal limits  HEMOGLOBIN AND HEMATOCRIT, BLOOD - Abnormal; Notable for the following components:   Hemoglobin 12.5 (*)    HCT 35.7 (*)    All other components within normal limits  APTT  HIV ANTIBODY (ROUTINE TESTING W REFLEX)  BASIC METABOLIC PANEL  CBC  HEMOGLOBIN AND HEMATOCRIT, BLOOD  POC OCCULT BLOOD, ED  TYPE AND SCREEN     EKG EKG independently reviewed interpreted by myself (ER attending) demonstrates:    RADIOLOGY Imaging independently reviewed and interpreted by myself demonstrates:  CT  abdomen pelvis without bowel obstruction, radiology does note hepatic cirrhosis with upper abdominal varices and splenic enlargement as well as nonobstructing renal stones.  PROCEDURES:  Critical Care performed: Yes, see critical care procedure note(s)  CRITICAL CARE Performed by: Trinna Post   Total critical care time: 42 minutes  Critical care time was exclusive of separately billable procedures and treating other patients.  Critical care was necessary to treat or prevent imminent or life-threatening deterioration.  Critical care was time spent personally by me on the following activities: development of treatment plan with patient and/or surrogate as well as nursing, discussions with consultants, evaluation of patient's response to treatment, examination of patient, obtaining history from patient or surrogate, ordering and performing treatments and interventions, ordering and review of laboratory studies, ordering and review of  radiographic studies, pulse oximetry and re-evaluation of patient's condition.   Procedures   MEDICATIONS ORDERED IN ED: Medications  pantoprozole (PROTONIX) 80 mg /NS 100 mL infusion (8 mg/hr Intravenous New Bag/Given 09/29/22 1845)  pantoprazole (PROTONIX) injection 40 mg (has no administration in time range)  octreotide (SANDOSTATIN) 2 mcg/mL load via infusion 50 mcg (50 mcg Intravenous Bolus from Bag 09/29/22 1849)    And  octreotide (SANDOSTATIN) 500 mcg in sodium chloride 0.9 % 250 mL (2 mcg/mL) infusion (50 mcg/hr Intravenous New Bag/Given 09/29/22 1849)  0.9 %  sodium chloride infusion ( Intravenous New Bag/Given 09/29/22 2239)  acetaminophen (TYLENOL) tablet 650 mg (650 mg Oral Given 09/29/22 2234)    Or  acetaminophen (TYLENOL) suppository 650 mg ( Rectal See Alternative 09/29/22 2234)  traZODone (DESYREL) tablet 25 mg (25 mg Oral Given 09/29/22 2338)  ondansetron (ZOFRAN) tablet 4 mg ( Oral See Alternative 09/29/22 2329)    Or  ondansetron (ZOFRAN) injection 4 mg (4 mg Intravenous Given 09/29/22 2329)  ondansetron (ZOFRAN) injection 4 mg (4 mg Intravenous Given 09/29/22 1814)  sodium chloride 0.9 % bolus 1,000 mL (0 mLs Intravenous Stopped 09/29/22 1943)  pantoprazole (PROTONIX) 80 mg /NS 100 mL IVPB (0 mg Intravenous Stopped 09/29/22 1859)  cefTRIAXone (ROCEPHIN) 1 g in sodium chloride 0.9 % 100 mL IVPB (0 g Intravenous Stopped 09/29/22 1943)  iohexol (OMNIPAQUE) 300 MG/ML solution 100 mL (100 mLs Intravenous Contrast Given 09/29/22 1921)     IMPRESSION / MDM / ASSESSMENT AND PLAN / ED COURSE  I reviewed the triage vital signs and the nursing notes.  Differential diagnosis includes, but is not limited to, variceal bleed, bleeding ulcer, other upper GI bleed source, much lower suspicion lower GI bleed, consideration for acute intra-abdominal process  Patient's presentation is most consistent with acute presentation with potential threat to life or bodily function.  42 year old male  presenting to the emergency department for evaluation of hematemesis.  Multiple episodes prior to presentation, 2 here, but no further episodes after receiving Zofran.  Was given a liter of IV fluids with improvement in his heart rate.  He was empirically treated for variceal bleed with Protonix infusion, octreotide infusion, and Rocephin.  Blood pressure has been stable.  CT does confirm presence of varices.  Will discuss with GI with plan for admission.  Clinical Course as of 09/30/22 0008  Wed Sep 29, 2022  2002 Case reviewed with Dr. Norma Fredrickson with GI.  Agrees with current management plan and admission to hospitalist team.  He will evaluate the patient tomorrow morning for likely endoscopy. [NR]    Clinical Course User Index [NR] Trinna Post, MD   Discussed with Dr. Arville Care with hospitalist team.  He  will evaluate the patient for anticipated admission.  FINAL CLINICAL IMPRESSION(S) / ED DIAGNOSES   Final diagnoses:  Bleeding esophageal varices, unspecified esophageal varices type (HCC)     Rx / DC Orders   ED Discharge Orders     None        Note:  This document was prepared using Dragon voice recognition software and may include unintentional dictation errors.   Trinna Post, MD 09/30/22 [phone]

## 2022-09-29 NOTE — ED Triage Notes (Addendum)
Pt to Ed via POV from home. Pt reports at 3pm he started vomiting blood. Pt reports heavy etoh use daily. Pt states approximately a 5th of hard liquor daily. Last drink last night around 6om. Pt presents with about roughly of blood in trash can that was a new bag at 5pm. Pt reports hx of kidney stones. Pt denies blood thinners. Pt denies hx of ulcers. Pt also states LLQ pain.

## 2022-09-29 NOTE — H&P (Incomplete)
[place]   PATIENT NAME: Robert Sellers    MR#:  [id number]  DATE OF BIRTH:  Sep 08, 1980  DATE OF ADMISSION:  09/29/2022  PRIMARY CARE PHYSICIAN: Pcp, No   Patient is coming from: Home  REQUESTING/REFERRING PHYSICIAN: Trinna Post, MD       CHIEF COMPLAINT:   Chief Complaint  Patient presents with   Hematemesis    HISTORY OF PRESENT ILLNESS:  Robert Sellers is a 42 y.o. Caucasian male with medical history significant for hypertension dyslipidemia, as well as esophageal varices with history of alcohol abuse, who presented to the emergency room with acute onset of nausea and vomiting with hematemesis of partly fresh blood and coffee-ground as well as epigastric abdominal pain.  He denied any melena or bright red blood per rectum.  90 dysuria, oliguria or hematuria or flank pain.  He admitted to mild headache and dizziness without loss of consciousness.  No cough or wheezing or hemoptysis.  No chest pain or palpitations.  ED Course: When he came to the ER, BP was 144/83 with respiratory to 20 and heart rate of 120 and otherwise normal vital signs.  Labs revealed mild hypokalemia of 3.4 and a CO2 of 21 with anion gap of 16.Marland Kitchen  AST was 148 and ALT 49 with a total protein of 8.4 and total bili of 3.7.  CBC was within normal except for thrombocytopenia of 89.  Repeat H&H were 12.5 and 35.7 compared to 14.5 and 41.4 earlier.  PT was 15.8 and INR 1.2 with PTT of 29.  Blood group was O+ with negative antibody screen. EKG as reviewed by me : EKG showed sinus tachycardia with a rate of 106 with minimal ST segment depression and prolonged QT interval with QTc of 572 MS. Imaging: Abdominal pelvic CT scan revealed the following: 1. Fatty infiltration of the liver. Hepatic cirrhosis with diffuse upper abdominal varices and splenic enlargement. 2. Bilateral nonobstructing intrarenal stones. Right hydronephrosis without obstructing stone demonstrated. Ureters are decompressed. 3. Minimal aortic  atherosclerosis. 4. 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  The patient was given 4 mg of IV Zofran and 80 mg of IV Protonix bolus and a drip, octreotide bolus and drip, 1 L bolus of IV normal saline and 1 g of IV Rocephin.  He will be admitted to a rest of unit bed for further evaluation and management. PAST MEDICAL HISTORY:   Past Medical History:  Diagnosis Date   Hyperlipidemia 12/04/2012   Hypertension     PAST SURGICAL HISTORY:   Past Surgical History:  Procedure Laterality Date   KNEE SURGERY      SOCIAL HISTORY:   Social History   Tobacco Use   Smoking status: Never   Smokeless tobacco: Never  Substance Use Topics   Alcohol use: Yes    Alcohol/week: 3.0 standard drinks of alcohol    Types: 3 Standard drinks or equivalent per week    FAMILY HISTORY:   Family History  Problem Relation Age of Onset   Heart disease Mother    Hyperlipidemia Mother    Hypertension Mother    Diabetes Mother    Heart disease Father    Hyperlipidemia Father    Hypertension Father    Diabetes Father     DRUG ALLERGIES:  No Known Allergies  REVIEW OF SYSTEMS:   ROS As per history of present illness. All pertinent systems were reviewed above. Constitutional, HEENT, cardiovascular, respiratory, GI, GU, musculoskeletal, neuro, psychiatric,  endocrine, integumentary and hematologic systems were reviewed and are otherwise negative/unremarkable except for positive findings mentioned above in the HPI.   MEDICATIONS AT HOME:   Prior to Admission medications   Not on File      VITAL SIGNS:  Blood pressure 128/72, pulse 67, temperature 98.8 F (37.1 C), temperature source Oral, resp. rate 18, SpO2 97%.  PHYSICAL EXAMINATION:  Physical Exam  GENERAL:  42 y.o.-year-old Caucasian male patient lying in the bed with no acute distress.  EYES: Pupils equal, round, reactive to light and accommodation. No scleral icterus. Extraocular muscles  intact.  HEENT: Head atraumatic, normocephalic. Oropharynx and nasopharynx clear.  NECK:  Supple, no jugular venous distention. No thyroid enlargement, no tenderness.  LUNGS: Normal breath sounds bilaterally, no wheezing, rales,rhonchi or crepitation. No use of accessory muscles of respiration.  CARDIOVASCULAR: Regular rate and rhythm, S1, S2 normal. No murmurs, rubs, or gallops.  ABDOMEN: Soft, nondistended, nontender. Bowel sounds present. No organomegaly or mass.  EXTREMITIES: No pedal edema, cyanosis, or clubbing.  NEUROLOGIC: Cranial nerves II through XII are intact. Muscle strength 5/5 in all extremities. Sensation intact. Gait not checked.  PSYCHIATRIC: The patient is alert and oriented x 3.  Normal affect and good eye contact. SKIN: No obvious rash, lesion, or ulcer.   LABORATORY PANEL:   CBC Recent Labs  Lab 09/29/22 1801 09/29/22 2220  WBC 6.3  --   HGB 14.5 12.5*  HCT 41.4 35.7*  PLT 89*  --    ------------------------------------------------------------------------------------------------------------------  Chemistries  Recent Labs  Lab 09/29/22 1801  NA 136  K 3.4*  CL 99  CO2 21*  GLUCOSE 146*  BUN 9  CREATININE 0.84  CALCIUM 9.1  AST 148*  ALT 49*  ALKPHOS 98  BILITOT 3.7*   ------------------------------------------------------------------------------------------------------------------  Cardiac Enzymes No results for input(s): "TROPONINI" in the last 168 hours. ------------------------------------------------------------------------------------------------------------------  RADIOLOGY:  CT ABDOMEN PELVIS W CONTRAST  Result Date: 09/29/2022 CLINICAL DATA:  Acute nonlocalized abdominal pain. Alcohol use. Previous history of kidney stones. EXAM: CT ABDOMEN AND PELVIS WITH CONTRAST TECHNIQUE: Multidetector CT imaging of the abdomen and pelvis was performed using the standard protocol following bolus administration of intravenous contrast. RADIATION DOSE  REDUCTION: This exam was performed according to the departmental dose-optimization program which includes automated exposure control, adjustment of the mA and/or kV according to patient size and/or use of iterative reconstruction technique. CONTRAST:  OMNIPAQUE IOHEXOL 300 MG/ML  SOLN COMPARISON:  None Available. FINDINGS: Lower chest: Lung bases are clear. Hepatobiliary: Diffuse fatty infiltration of the liver. Nodular liver contour likely indicating cirrhosis. No focal lesions. Portal veins are patent. Pancreas: Gallbladder is distended. No wall thickening or stones. No bile duct dilatation. Spleen: Spleen is enlarged.  No focal lesions.  Calcified granuloma. Adrenals/Urinary Tract: No adrenal gland nodules. Bilateral renal stones with several stones in the right and single stone on the left. Largest in the right lower pole measures about 1.1 cm diameter. Mild right hydronephrosis without hydroureter. No specific obstructing stones are demonstrated. Bladder is normal. Stomach/Bowel: Stomach is within normal limits. Appendix appears normal. No evidence of bowel wall thickening, distention, or inflammatory changes. Vascular/Lymphatic: Diffuse upper abdominal varices. Minimal calcification of the aorta. No aneurysm. No significant lymphadenopathy. Reproductive: Prostate is unremarkable. Other: No abdominal wall hernia or abnormality. No abdominopelvic ascites. Musculoskeletal: Mild curvilinear sclerosis in the femoral heads bilaterally suggesting avascular necrosis. IMPRESSION: 1. Fatty infiltration of the liver. Hepatic cirrhosis with diffuse upper abdominal varices and splenic enlargement. 2. Bilateral nonobstructing intrarenal stones.  Right hydronephrosis without obstructing stone demonstrated. Ureters are decompressed. 3. Minimal aortic atherosclerosis. 4. 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 Electronically Signed   By: Burman Nieves M.D.   On: 09/29/2022 19:50       IMPRESSION AND PLAN:  Assessment and Plan: * Acute upper GI bleeding - The patient will be admitted to a progressive unit bed. - The patient has subsequent acute blood loss anemia. - We will follow serial hemoglobins and hematocrits. - We will continue IV Protonix drip as well as IV octreotide drip. - We will continue prophylactic IV Rocephin. - We will obtain a GI consult. - Dr. Norma Fredrickson was notified about the patient.  Hypokalemia Potassium will be replaced and magnesium will be checked.  Alcohol abuse - Will be placed on CIWA protocol.   DVT prophylaxis: SCDs. Advanced Care Planning:  Code Status: full code.  Family Communication:  The plan of care was discussed in details with the patient (and family). I answered all questions. The patient agreed to proceed with the above mentioned plan. Further management will depend upon hospital course. Disposition Plan: Back to previous home environment Consults called: GI. All the records are reviewed and case discussed with ED provider.  Status is: Inpatient   At the time of the admission, it appears that the appropriate admission status for this patient is inpatient.  This is judged to be reasonable and necessary in order to provide the required intensity of service to ensure the patient's safety given the presenting symptoms, physical exam findings and initial radiographic and laboratory data in the context of comorbid conditions.  The patient requires inpatient status due to high intensity of service, high risk of further deterioration and high frequency of surveillance required.  I certify that at the time of admission, it is my clinical judgment that the patient will require inpatient hospital care extending more than 2 midnights.                            Dispo: The patient is from: Home              Anticipated d/c is to: Home              Patient currently is not medically stable to d/c.              Difficult to place  patient: No  Hannah Beat M.D on 09/30/2022 at 2:46 AM  [facility]   From 7 PM-7 AM, contact night-coverage [email or web address]  CC: Primary care physician; Pcp, No

## 2022-09-30 ENCOUNTER — Encounter
Admission: EM | Disposition: A | Discharge status: Home / Self Care | Payer: Self-pay | Source: Home / Self Care | Attending: Internal Medicine

## 2022-09-30 ENCOUNTER — Inpatient Hospital Stay: Payer: Self-pay | Admitting: Anesthesiology

## 2022-09-30 ENCOUNTER — Encounter: Payer: Self-pay | Admitting: Family Medicine

## 2022-09-30 DIAGNOSIS — E876 Hypokalemia: Secondary | ICD-10-CM

## 2022-09-30 DIAGNOSIS — F1010 Alcohol abuse, uncomplicated: Secondary | ICD-10-CM

## 2022-09-30 HISTORY — PX: ESOPHAGOGASTRODUODENOSCOPY (EGD) WITH PROPOFOL: SHX5813

## 2022-09-30 LAB — CBC
HCT: 32.9 % — ABNORMAL LOW (ref 39.0–52.0)
Hemoglobin: 11.1 g/dL — ABNORMAL LOW (ref 13.0–17.0)
MCH: 33 pg (ref 26.0–34.0)
MCHC: 33.7 g/dL (ref 30.0–36.0)
MCV: 97.9 fL (ref 80.0–100.0)
Platelets: 49 10*3/uL — ABNORMAL LOW (ref 150–400)
RBC: 3.36 MIL/uL — ABNORMAL LOW (ref 4.22–5.81)
RDW: 13.4 % (ref 11.5–15.5)
WBC: 3.4 10*3/uL — ABNORMAL LOW (ref 4.0–10.5)
nRBC: 0 % (ref 0.0–0.2)

## 2022-09-30 LAB — BASIC METABOLIC PANEL
Anion gap: 9 (ref 5–15)
BUN: 10 mg/dL (ref 6–20)
CO2: 26 mmol/L (ref 22–32)
Calcium: 7.9 mg/dL — ABNORMAL LOW (ref 8.9–10.3)
Chloride: 101 mmol/L (ref 98–111)
Creatinine, Ser: 0.65 mg/dL (ref 0.61–1.24)
GFR, Estimated: >60 mL/min (ref >60)
Glucose, Bld: 165 mg/dL — ABNORMAL HIGH (ref 70–99)
Potassium: 3.9 mmol/L (ref 3.5–5.1)
Sodium: 136 mmol/L (ref 135–145)

## 2022-09-30 LAB — URINALYSIS, COMPLETE (UACMP) WITH MICROSCOPIC
Bacteria, UA: NONE SEEN
Bilirubin Urine: NEGATIVE
Glucose, UA: NEGATIVE mg/dL
Ketones, ur: 20 mg/dL — AB
Leukocytes,Ua: NEGATIVE
Nitrite: NEGATIVE
Protein, ur: 100 mg/dL — AB
RBC / HPF: >50 RBC/hpf (ref 0–5)
Specific Gravity, Urine: 1.041 — ABNORMAL HIGH (ref 1.005–1.030)
pH: 7 (ref 5.0–8.0)

## 2022-09-30 LAB — HEMOGLOBIN AND HEMATOCRIT, BLOOD
HCT: 33.9 % — ABNORMAL LOW (ref 39.0–52.0)
Hemoglobin: 11.4 g/dL — ABNORMAL LOW (ref 13.0–17.0)

## 2022-09-30 LAB — HIV ANTIBODY (ROUTINE TESTING W REFLEX): HIV Screen 4th Generation wRfx: NONREACTIVE

## 2022-09-30 SURGERY — ESOPHAGOGASTRODUODENOSCOPY (EGD) WITH PROPOFOL
Anesthesia: General

## 2022-09-30 MED ORDER — LORAZEPAM 1 MG PO TABS: 1.0000 mg | ORAL_TABLET | ORAL | Status: DC | PRN

## 2022-09-30 MED ORDER — LORAZEPAM 2 MG/ML IJ SOLN: 1.0000 mg | INTRAMUSCULAR | Status: DC | PRN

## 2022-09-30 MED ORDER — ONDANSETRON HCL 4 MG/2ML IJ SOLN
INTRAMUSCULAR | Status: DC | PRN
  Administered 2022-09-30: 4 mg via INTRAVENOUS

## 2022-09-30 MED ORDER — ADULT MULTIVITAMIN W/MINERALS CH
1.0000 | ORAL_TABLET | Freq: Every day | ORAL | Status: DC
  Administered 2022-09-30 – 2022-10-01 (×2): 1 via ORAL
  Filled 2022-09-30 (×2): qty 1

## 2022-09-30 MED ORDER — SODIUM CHLORIDE 0.9 % IV SOLN
1.0000 g | INTRAVENOUS | Status: DC
  Administered 2022-09-30: 1 g via INTRAVENOUS
  Filled 2022-09-30 (×2): qty 10

## 2022-09-30 MED ORDER — SUCCINYLCHOLINE CHLORIDE 200 MG/10ML IV SOSY
PREFILLED_SYRINGE | INTRAVENOUS | Status: DC | PRN
  Administered 2022-09-30: 140 mg via INTRAVENOUS

## 2022-09-30 MED ORDER — THIAMINE MONONITRATE 100 MG PO TABS
100.0000 mg | ORAL_TABLET | Freq: Every day | ORAL | Status: DC
  Administered 2022-09-30 – 2022-10-01 (×2): 100 mg via ORAL
  Filled 2022-09-30 (×3): qty 1

## 2022-09-30 MED ORDER — FOLIC ACID 1 MG PO TABS
1.0000 mg | ORAL_TABLET | Freq: Every day | ORAL | Status: DC
  Administered 2022-09-30 – 2022-10-01 (×2): 1 mg via ORAL
  Filled 2022-09-30 (×2): qty 1

## 2022-09-30 MED ORDER — LIDOCAINE HCL (CARDIAC) PF 100 MG/5ML IV SOSY
PREFILLED_SYRINGE | INTRAVENOUS | Status: DC | PRN
  Administered 2022-09-30: 100 mg via INTRAVENOUS

## 2022-09-30 MED ORDER — PROPOFOL 10 MG/ML IV BOLUS
INTRAVENOUS | Status: DC | PRN
  Administered 2022-09-30: 100 mg via INTRAVENOUS
  Administered 2022-09-30: 200 mg via INTRAVENOUS

## 2022-09-30 MED ORDER — EPHEDRINE SULFATE (PRESSORS) 50 MG/ML IJ SOLN
INTRAMUSCULAR | Status: DC | PRN
  Administered 2022-09-30: 10 mg via INTRAVENOUS

## 2022-09-30 MED ORDER — DEXMEDETOMIDINE HCL IN NACL 200 MCG/50ML IV SOLN
INTRAVENOUS | Status: DC | PRN
Start: 2022-09-30 — End: 2022-09-30
  Administered 2022-09-30: 20 ug via INTRAVENOUS

## 2022-09-30 MED ORDER — THIAMINE HCL 100 MG/ML IJ SOLN
100.0000 mg | Freq: Every day | INTRAMUSCULAR | Status: DC
  Filled 2022-09-30: qty 2

## 2022-09-30 MED ORDER — DEXAMETHASONE SODIUM PHOSPHATE 10 MG/ML IJ SOLN
INTRAMUSCULAR | Status: DC | PRN
  Administered 2022-09-30: 10 mg via INTRAVENOUS

## 2022-09-30 NOTE — Progress Notes (Signed)
Progress Note   Patient: Robert Sellers DOB: 01-04-1981 DOA: 09/29/2022     1 DOS: the patient was seen and examined on 09/30/2022   Brief hospital course: Robert Sellers is a 42 y.o. Caucasian male with medical history significant for hypertension dyslipidemia, as well as esophageal varices with history of alcohol abuse, who presented to the emergency room with acute onset of nausea and vomiting with hematemesis of partly fresh blood and coffee-ground as well as epigastric abdominal pain.  He denied any melena or bright red blood per rectum. No dysuria, oliguria or hematuria or flank pain.  He admitted to mild headache and dizziness without loss of consciousness.  No cough or wheezing or hemoptysis.  No chest pain or palpitations.   ED Course: When he came to the ER, BP was 144/83 with respiratory to 20 and heart rate of 120 and otherwise normal vital signs.  Labs revealed mild hypokalemia of 3.4 and a CO2 of 21 with anion gap of 16.Marland Kitchen  AST was 148 and ALT 49 with a total protein of 8.4 and total bili of 3.7.  CBC was within normal except for thrombocytopenia of 89.  Repeat H&H were 12.5 and 35.7 compared to 14.5 and 41.4 earlier.  PT was 15.8 and INR 1.2 with PTT of 29.  Blood group was O+ with negative antibody screen. EKG as reviewed by me : EKG showed sinus tachycardia with a rate of 106 with minimal ST segment depression and prolonged QT interval with QTc of 572 MS. Imaging: Abdominal pelvic CT scan revealed the following: 1. Fatty infiltration of the liver. Hepatic cirrhosis with diffuse upper abdominal varices and splenic enlargement. 2. Bilateral nonobstructing intrarenal stones. Right hydronephrosis without obstructing stone demonstrated. Ureters are decompressed. 3. Minimal aortic atherosclerosis. 4. 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   The patient was given 4 mg of IV Zofran and 80 mg of IV Protonix bolus and a drip, octreotide  bolus and drip, 1 L bolus of IV normal saline and 1 g of IV Rocephin.  He will be admitted to a rest of unit bed for further evaluation and management.    Assessment and Plan:  * Acute blood loss anemia Secondary to upper GI bleeding ??  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  Drop in hemoglobin from 14.5 >> 11.4 Continue IV octreotide and IV Protonix Continue Rocephin as primary prophylaxis for SBP Appreciate GI input For upper endoscopy today Monitor H&H and transfuse as needed  Hypokalemia Supplemented   Alcohol abuse - Patient counseled on the need to abstain from further alcohol use Continue CIWA protocol and administer Ativan for CIWA score of 8 or greater        Subjective: Patient seen and examined at the bedside.  No new complaints.  Physical Exam: Vitals:   09/30/22 0323 09/30/22 0337 09/30/22 0739 09/30/22 1346  BP: 111/67  117/76 118/69  Pulse: (!) 58  (!) 54 (!) 47  Resp: 16  16 16   Temp: 98.9 F (37.2 C)  98.8 F (37.1 C) 97.6 F (36.4 C)  TempSrc: Oral  Oral Temporal  SpO2: 97%  98% 97%  Weight:  86.3 kg    Height:  5\' 11"  (1.803 m)     GENERAL:  42 y.o.-year-old Caucasian male patient lying in the bed with no acute distress.  EYES: Pupils equal, round, reactive to light and accommodation. No  scleral icterus. Extraocular muscles intact.  HEENT: Head atraumatic, normocephalic. Oropharynx and nasopharynx clear.  NECK:  Supple, no jugular venous distention. No thyroid enlargement, no tenderness.  LUNGS: Normal breath sounds bilaterally, no wheezing, rales,rhonchi or crepitation. No use of accessory muscles of respiration.  CARDIOVASCULAR: Regular rate and rhythm, S1, S2 normal. No murmurs, rubs, or gallops.  ABDOMEN: Soft, nondistended, nontender. Bowel sounds present. No organomegaly or mass.  EXTREMITIES: No pedal edema, cyanosis, or clubbing.  NEUROLOGIC:  Cranial nerves II through XII are intact. Muscle strength 5/5 in all extremities. Sensation intact. Gait not checked.  PSYCHIATRIC: The patient is alert and oriented x 3.  Normal affect and good eye contact. SKIN: No obvious rash, lesion, or ulcer.    Data Reviewed: Labs reviewed.  Hemoglobin 11.4 There are no new results to review at this time.  Family Communication: Plan of care discussed with patient and his wife at the bedside.  All questions and concerns have been addressed.  They verbalized understanding and agreed with the plan.  Disposition: Status is: Inpatient Remains inpatient appropriate because: Evaluation for acute blood loss anemia from upper GI source  Planned Discharge Destination: Home    Time spent: 35 minutes  Author: Lucile Shutters, MD 09/30/2022 3:19 PM  For on call review [email or web address].

## 2022-09-30 NOTE — Anesthesia Preprocedure Evaluation (Signed)
Anesthesia Evaluation  Patient identified by MRN, date of birth, ID band Patient awake  General Assessment Comment:  12 episodes of hematemesis last night.  Reviewed: Allergy & Precautions, NPO status , Patient's Chart, lab work & pertinent test results  History of Anesthesia Complications Negative for: history of anesthetic complications  Airway Mallampati: II  TM Distance: >3 FB Neck ROM: Full    Dental no notable dental hx. (+) Teeth Intact   Pulmonary neg pulmonary ROS, neg sleep apnea, neg COPD, Patient abstained from smoking.Not current smoker   Pulmonary exam normal breath sounds clear to auscultation       Cardiovascular Exercise Tolerance: Good METShypertension, Pt. on medications (-) CAD and (-) Past MI (-) dysrhythmias  Rhythm:Regular Rate:Normal - Systolic murmurs    Neuro/Psych negative neurological ROS  negative psych ROS   GI/Hepatic ,neg GERD  ,,(+) Cirrhosis   Esophageal Varices  substance abuse  alcohol useDaily drinker. Has had shakes and "the DT's" after cessation of alcohol. Denies ever having withdrawal seizures.   Endo/Other  neg diabetes    Renal/GU negative Renal ROS     Musculoskeletal   Abdominal   Peds  Hematology  (+) Blood dyscrasia Thrombocytopenia , platelets = 49k today. Dr Norma Fredrickson made aware.   Anesthesia Other Findings Past Medical History: 12/04/2012: Hyperlipidemia No date: Hypertension  Reproductive/Obstetrics                             Anesthesia Physical Anesthesia Plan  ASA: 3  Anesthesia Plan: General   Post-op Pain Management: Minimal or no pain anticipated   Induction: Intravenous and Rapid sequence  PONV Risk Score and Plan: 2 and Ondansetron, Dexamethasone and Midazolam  Airway Management Planned: Oral ETT and Video Laryngoscope Planned  Additional Equipment: None  Intra-op Plan:   Post-operative Plan: Extubation in  OR  Informed Consent: I have reviewed the patients History and Physical, chart, labs and discussed the procedure including the risks, benefits and alternatives for the proposed anesthesia with the patient or authorized representative who has indicated his/her understanding and acceptance.     Dental advisory given  Plan Discussed with: CRNA and Surgeon  Anesthesia Plan Comments: (Discussed risks of anesthesia with patient, including PONV, sore throat, lip/dental/eye damage, aspiration. Rare risks discussed as well, such as cardiorespiratory and neurological sequelae, and allergic reactions. Discussed the role of CRNA in patient's perioperative care. Patient understands. Encouraged alcohol cessation, and finding support and treatment options of alcoholism.)       Anesthesia Quick Evaluation

## 2022-09-30 NOTE — Consult Note (Signed)
GI Inpatient Consult Note  Reason for Consult: Hematemesis/UGIB   Attending Requesting Consult: Dr. Trinna Post, MD  History of Present Illness: Robert Sellers is a 42 y.o. male seen for evaluation of hematemesis/UGIB at the request of ED physician - Dr. Trinna Post. Patient has a PMH of HTN, HLD, anxiety, depression, alcoholic cirrhosis of the liver c/b esophageal varices and hepatic encephalopathy, and daily EtOH abuse. He presented to the [facility] ED yesterday evening around dinner time for chief complaint of 10 episodes of hematemesis which started around 3 PM yesterday. He had 2 additional episodes of coffee-ground emesis in the ED. Upon presentation to the ED, he was hypertensive (144/83), tachycardic (120), tachypneic (20), and otherwise normal vital signs. Labs significant for hemoglobin 14.5, WBC 6.3K, platelets 89K, potassium 3.4, BUN 9, serum creatinine 0.84, and INR 1.2. CT abd/pelvis with contrast commented on cirrhotic appearing liver with upper abdominal varices and splenic enlargement with no other acute findings. He was treated with Rocephin and started on Protonix gtt and Octreotide gtt. He was admitted to the floor and GI consulted for further evaluation and management.   Patient seen and examined this morning resting comfortably in hospital bed. His girlfriend, Robert Sellers, is present in room with patient. He denies any further episodes of hematemesis since presenting to the ED. He reports when he woke up yesterday he was in his normal state of health. He suddenly started to get nauseous around 3 PM yesterday afternoon with urge to vomit. He did have associated upper abdominal pain described as cramping and aching in nature. He has had a total of ~12 episodes of hematemesis which has been both coffee-ground emesis and frank blood with some clots in it. He reports a long history of daily EtOH abuse. He estimates he drinks 1-1.5 pints of hard liquor daily. He reports he was previously followed by  Robert Sellers, but hasn't been seen in over 3 years. Previous endoscopy ~2021 did comment on esophageal varices, but unsure if he required banding. He hasn't been followed for his cirrhosis in over 3-years. He denies any known family history of cirrhosis. He denies any jaundice, pruritus, abdominal swelling, lower extremity edema, or altered mental status. Hemoglobin this morning was 11.4.   Past Medical History:  Past Medical History:  Diagnosis Date   Hyperlipidemia 12/04/2012   Hypertension     Problem List: Patient Active Problem List   Diagnosis Date Noted   Alcohol abuse 09/30/2022   Hypokalemia 09/30/2022   Acute upper GI bleeding 09/29/2022   Left shoulder pain 08/28/2015   Hyperlipidemia 12/04/2012    Past Surgical History: Past Surgical History:  Procedure Laterality Date   KNEE SURGERY      Allergies: No Known Allergies  Home Medications: No medications prior to admission.   Home medication reconciliation was completed with the patient.   Scheduled Inpatient Medications:    folic acid  1 mg Oral Daily   multivitamin with minerals  1 tablet Oral Daily   [START ON 10/03/2022] pantoprazole  40 mg Intravenous Q12H   thiamine  100 mg Oral Daily   Or   thiamine  100 mg Intravenous Daily    Continuous Inpatient Infusions:    sodium chloride 100 mL/hr at 09/30/22 0400   cefTRIAXone (ROCEPHIN)  IV     octreotide (SANDOSTATIN) 500 mcg in sodium chloride 0.9 % 250 mL (2 mcg/mL) infusion 50 mcg/hr (09/30/22 0515)   pantoprazole 8 mg/hr (09/30/22 0400)    PRN Inpatient Medications:  acetaminophen **OR** acetaminophen, LORazepam **OR** LORazepam, ondansetron **OR** ondansetron (ZOFRAN) IV, traZODone  Family History: family history includes Diabetes in his father and mother; Heart disease in his father and mother; Hyperlipidemia in his father and mother; Hypertension in his father and mother.  The patient's family history is negative for inflammatory bowel disorders, GI  malignancy, or solid organ transplantation.  Social History:   reports that he has never smoked. He has never used smokeless tobacco. He reports current alcohol use of about 3.0 standard drinks of alcohol per week. He reports that he does not use drugs. The patient denies ETOH, tobacco, or drug use.   Review of Systems: Constitutional: Weight is stable.  Eyes: No changes in vision. ENT: No oral lesions, sore throat.  GI: see HPI.  Heme/Lymph: No easy bruising.  CV: No chest pain.  GU: No hematuria.  Integumentary: No rashes.  Neuro: No headaches.  Psych: No depression/anxiety.  Endocrine: No heat/cold intolerance.  Allergic/Immunologic: No urticaria.  Resp: No cough, SOB.  Musculoskeletal: No joint swelling.    Physical Examination: BP 117/76 (BP Location: Right Arm)   Pulse (!) 54   Temp 98.8 F (37.1 C) (Oral)   Resp 16   Ht 5\' 11"  (1.803 m)   Wt 86.3 kg   SpO2 98%   BMI 26.54 kg/m  Gen: NAD, alert and oriented x 4 HEENT: PEERLA, EOMI, Neck: supple, no JVD or thyromegaly Chest: CTA bilaterally, no wheezes, crackles, or other adventitious sounds CV: RRR, no m/g/c/r Abd: soft, NT, ND, +BS in all four quadrants; no HSM, guarding, ridigity, or rebound tenderness Ext: no edema, well perfused with 2+ pulses, Skin: no rash or lesions noted Lymph: no LAD  Data: Lab Results  Component Value Date   WBC 3.4 (L) 09/30/2022   HGB 11.4 (L) 09/30/2022   HCT 33.9 (L) 09/30/2022   MCV 97.9 09/30/2022   PLT 49 (L) 09/30/2022   Recent Labs  Lab 09/29/22 2220 09/30/22 0317 09/30/22 0919  HGB 12.5* 11.1* 11.4*   Lab Results  Component Value Date   NA 136 09/30/2022   K 3.9 09/30/2022   CL 101 09/30/2022   CO2 26 09/30/2022   BUN 10 09/30/2022   CREATININE 0.65 09/30/2022   Lab Results  Component Value Date   ALT 49 (H) 09/29/2022   AST 148 (H) 09/29/2022   ALKPHOS 98 09/29/2022   BILITOT 3.7 (H) 09/29/2022   Recent Labs  Lab 09/29/22 1801  APTT 29  INR 1.2    CT abd/pelvis with contrast 09/29/2022: IMPRESSION: 1. Fatty infiltration of the liver. Hepatic cirrhosis with diffuse upper abdominal varices and splenic enlargement. 2. Bilateral nonobstructing intrarenal stones. Right hydronephrosis without obstructing stone demonstrated. Ureters are decompressed. 3. Minimal aortic atherosclerosis. 4. 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    Assessment/Plan:  42 y/o Caucasian male with a PMH of HTN, HLD, anxiety, depression, alcoholic cirrhosis of the liver c/b esophageal varices and hepatic encephalopathy, and daily EtOH abuse presented to the [facility] ED yesterday evening for chief complaint of 10+ episodes of hematemesis c/w UGIB  Upper GI bleed/Hematemesis - DDx includes esophageal variceal bleeding, PUD, erosive esophagitis, gastritis, Dieulafoy's lesion, AVMs, neoplasm, polyp, duodenitis, GAVE, etc. H&H stable currently, but has dropped 3-grams since presentation. No overt GI bleeding.   Alcoholic cirrhosis of the liver - MELD 3.0 14   Esophageal varices confirmed by endoscopy ~2021  Daily EtOH abuse   Hypokalemia   Recommendations:  - Maintain 2 large  bore IVs for access - Continue to monitor serial H&H. Transfuse for Hgb <7.0.  - Continue Rocephin 1 gram IV q24h, Octreotide gtt, and Protonix gtt - Continue to monitor for signs of GI bleeding. No overt GI bleeding currently.  - CIWA protocol - Advise EGD for luminal evaluation +/- variceal banding +/- biopsies +/- endoscopic hemostasis.  - EGD today with Dr. Norma Fredrickson - Strict NPO for now - See procedure note for findings and further recommendations - Follow-up outpatient for cirrhosis management/HCC surveillance   I reviewed the risks (including bleeding, perforation, infection, anesthesia complications, cardiac/respiratory complications), benefits and alternatives of EGD. Patient consents to proceed.    Thank you for the consult. Please call  with questions or concerns.  Gilda Crease, PA-C [facility] [phone]

## 2022-09-30 NOTE — Assessment & Plan Note (Signed)
-   Will be placed on CIWA protocol.

## 2022-09-30 NOTE — Anesthesia Procedure Notes (Signed)
Procedure Name: Intubation Date/Time: 09/30/2022 2:51 PM  Performed by: Stormy Fabian, CRNAPre-anesthesia Checklist: Patient identified, Emergency Drugs available, Suction available and Patient being monitored Patient Re-evaluated:Patient Re-evaluated prior to induction Oxygen Delivery Method: Circle system utilized Preoxygenation: Pre-oxygenation with 100% oxygen Induction Type: IV induction, Cricoid Pressure applied and Rapid sequence Ventilation: Mask ventilation without difficulty Laryngoscope Size: Mac, 4 and McGraph Grade View: Grade I Tube type: Oral Tube size: 7.0 mm Number of attempts: 1 Airway Equipment and Method: Stylet Placement Confirmation: ETT inserted through vocal cords under direct vision, positive ETCO2 and breath sounds checked- equal and bilateral Secured at: 21 cm Tube secured with: Tape Dental Injury: Teeth and Oropharynx as per pre-operative assessment

## 2022-09-30 NOTE — Anesthesia Postprocedure Evaluation (Signed)
Anesthesia Post Note  Patient: Robert Sellers  Procedure(s) Performed: ESOPHAGOGASTRODUODENOSCOPY (EGD) WITH PROPOFOL  Patient location during evaluation: PACU Anesthesia Type: General Level of consciousness: awake and alert, oriented and patient cooperative Pain management: pain level controlled Vital Signs Assessment: post-procedure vital signs reviewed and stable Respiratory status: spontaneous breathing, nonlabored ventilation and respiratory function stable Cardiovascular status: blood pressure returned to baseline and stable Postop Assessment: adequate PO intake Anesthetic complications: no   No notable events documented.   Last Vitals:  Vitals:   09/30/22 1539 09/30/22 1611  BP: (!) 101/57 119/67  Pulse: 71 61  Resp: (!) 22 20  Temp:  36.9 C  SpO2: 94% 96%    Last Pain:  Vitals:   09/30/22 1611  TempSrc: Oral  PainSc:                  Reed Breech

## 2022-09-30 NOTE — Interval H&P Note (Signed)
History and Physical Interval Note:  09/30/2022 2:36 PM  Robert Sellers  has presented today for surgery, with the diagnosis of Hematemesis, alcoholic cirrhosis of liver, esophageal varices in cirrhosis, EtOH abuse, portal hypertension.  The various methods of treatment have been discussed with the patient and family. After consideration of risks, benefits and other options for treatment, the patient has consented to  Procedure(s): ESOPHAGOGASTRODUODENOSCOPY (EGD) WITH PROPOFOL (N/A) as a surgical intervention.  The patient's history has been reviewed, patient examined, no change in status, stable for surgery.  I have reviewed the patient's chart and labs.  Questions were answered to the patient's satisfaction.     [place], [place]

## 2022-09-30 NOTE — Discharge Instructions (Signed)

## 2022-09-30 NOTE — H&P (View-Only) (Signed)
GI Inpatient Consult Note  Reason for Consult: Hematemesis/UGIB   Attending Requesting Consult: Dr. Trinna Post, MD  History of Present Illness: Robert Sellers is a 42 y.o. male seen for evaluation of hematemesis/UGIB at the request of ED physician - Dr. Trinna Post. Patient has a PMH of HTN, HLD, anxiety, depression, alcoholic cirrhosis of the liver c/b esophageal varices and hepatic encephalopathy, and daily EtOH abuse. He presented to the [facility] ED yesterday evening around dinner time for chief complaint of 10 episodes of hematemesis which started around 3 PM yesterday. He had 2 additional episodes of coffee-ground emesis in the ED. Upon presentation to the ED, he was hypertensive (144/83), tachycardic (120), tachypneic (20), and otherwise normal vital signs. Labs significant for hemoglobin 14.5, WBC 6.3K, platelets 89K, potassium 3.4, BUN 9, serum creatinine 0.84, and INR 1.2. CT abd/pelvis with contrast commented on cirrhotic appearing liver with upper abdominal varices and splenic enlargement with no other acute findings. He was treated with Rocephin and started on Protonix gtt and Octreotide gtt. He was admitted to the floor and GI consulted for further evaluation and management.   Patient seen and examined this morning resting comfortably in hospital bed. His girlfriend, [name], is present in room with patient. He denies any further episodes of hematemesis since presenting to the ED. He reports when he woke up yesterday he was in his normal state of health. He suddenly started to get nauseous around 3 PM yesterday afternoon with urge to vomit. He did have associated upper abdominal pain described as cramping and aching in nature. He has had a total of ~12 episodes of hematemesis which has been both coffee-ground emesis and frank blood with some clots in it. He reports a long history of daily EtOH abuse. He estimates he drinks 1-1.5 pints of hard liquor daily. He reports he was previously followed by  Elwin Sleight, but hasn't been seen in over 3 years. Previous endoscopy ~2021 did comment on esophageal varices, but unsure if he required banding. He hasn't been followed for his cirrhosis in over 3-years. He denies any known family history of cirrhosis. He denies any jaundice, pruritus, abdominal swelling, lower extremity edema, or altered mental status. Hemoglobin this morning was 11.4.   Past Medical History:  Past Medical History:  Diagnosis Date   Hyperlipidemia 12/04/2012   Hypertension     Problem List: Patient Active Problem List   Diagnosis Date Noted   Alcohol abuse 09/30/2022   Hypokalemia 09/30/2022   Acute upper GI bleeding 09/29/2022   Left shoulder pain 08/28/2015   Hyperlipidemia 12/04/2012    Past Surgical History: Past Surgical History:  Procedure Laterality Date   KNEE SURGERY      Allergies: No Known Allergies  Home Medications: No medications prior to admission.   Home medication reconciliation was completed with the patient.   Scheduled Inpatient Medications:    folic acid  1 mg Oral Daily   multivitamin with minerals  1 tablet Oral Daily   [START ON 10/03/2022] pantoprazole  40 mg Intravenous Q12H   thiamine  100 mg Oral Daily   Or   thiamine  100 mg Intravenous Daily    Continuous Inpatient Infusions:    sodium chloride 100 mL/hr at 09/30/22 0400   cefTRIAXone (ROCEPHIN)  IV     octreotide (SANDOSTATIN) 500 mcg in sodium chloride 0.9 % 250 mL (2 mcg/mL) infusion 50 mcg/hr (09/30/22 0515)   pantoprazole 8 mg/hr (09/30/22 0400)    PRN Inpatient Medications:  acetaminophen **OR** acetaminophen, LORazepam **OR** LORazepam, ondansetron **OR** ondansetron (ZOFRAN) IV, traZODone  Family History: family history includes Diabetes in his father and mother; Heart disease in his father and mother; Hyperlipidemia in his father and mother; Hypertension in his father and mother.  The patient's family history is negative for inflammatory bowel disorders, GI  malignancy, or solid organ transplantation.  Social History:   reports that he has never smoked. He has never used smokeless tobacco. He reports current alcohol use of about 3.0 standard drinks of alcohol per week. He reports that he does not use drugs. The patient denies ETOH, tobacco, or drug use.   Review of Systems: Constitutional: Weight is stable.  Eyes: No changes in vision. ENT: No oral lesions, sore throat.  GI: see HPI.  Heme/Lymph: No easy bruising.  CV: No chest pain.  GU: No hematuria.  Integumentary: No rashes.  Neuro: No headaches.  Psych: No depression/anxiety.  Endocrine: No heat/cold intolerance.  Allergic/Immunologic: No urticaria.  Resp: No cough, SOB.  Musculoskeletal: No joint swelling.    Physical Examination: BP 117/76 (BP Location: Right Arm)   Pulse (!) 54   Temp 98.8 F (37.1 C) (Oral)   Resp 16   Ht 5\' 11"  (1.803 m)   Wt 86.3 kg   SpO2 98%   BMI 26.54 kg/m  Gen: NAD, alert and oriented x 4 HEENT: PEERLA, EOMI, Neck: supple, no JVD or thyromegaly Chest: CTA bilaterally, no wheezes, crackles, or other adventitious sounds CV: RRR, no m/g/c/r Abd: soft, NT, ND, +BS in all four quadrants; no HSM, guarding, ridigity, or rebound tenderness Ext: no edema, well perfused with 2+ pulses, Skin: no rash or lesions noted Lymph: no LAD  Data: Lab Results  Component Value Date   WBC 3.4 (L) 09/30/2022   HGB 11.4 (L) 09/30/2022   HCT 33.9 (L) 09/30/2022   MCV 97.9 09/30/2022   PLT 49 (L) 09/30/2022   Recent Labs  Lab 09/29/22 2220 09/30/22 0317 09/30/22 0919  HGB 12.5* 11.1* 11.4*   Lab Results  Component Value Date   NA 136 09/30/2022   K 3.9 09/30/2022   CL 101 09/30/2022   CO2 26 09/30/2022   BUN 10 09/30/2022   CREATININE 0.65 09/30/2022   Lab Results  Component Value Date   ALT 49 (H) 09/29/2022   AST 148 (H) 09/29/2022   ALKPHOS 98 09/29/2022   BILITOT 3.7 (H) 09/29/2022   Recent Labs  Lab 09/29/22 1801  APTT 29  INR 1.2    CT abd/pelvis with contrast 09/29/2022: IMPRESSION: 1. Fatty infiltration of the liver. Hepatic cirrhosis with diffuse upper abdominal varices and splenic enlargement. 2. Bilateral nonobstructing intrarenal stones. Right hydronephrosis without obstructing stone demonstrated. Ureters are decompressed. 3. Minimal aortic atherosclerosis. 4. 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    Assessment/Plan:  42 y/o Caucasian male with a PMH of HTN, HLD, anxiety, depression, alcoholic cirrhosis of the liver c/b esophageal varices and hepatic encephalopathy, and daily EtOH abuse presented to the [facility] ED yesterday evening for chief complaint of 10+ episodes of hematemesis c/w UGIB  Upper GI bleed/Hematemesis - DDx includes esophageal variceal bleeding, PUD, erosive esophagitis, gastritis, Dieulafoy's lesion, AVMs, neoplasm, polyp, duodenitis, GAVE, etc. H&H stable currently, but has dropped 3-grams since presentation. No overt GI bleeding.   Alcoholic cirrhosis of the liver - MELD 3.0 14   Esophageal varices confirmed by endoscopy ~2021  Daily EtOH abuse   Hypokalemia   Recommendations:  - Maintain 2 large  bore IVs for access - Continue to monitor serial H&H. Transfuse for Hgb <7.0.  - Continue Rocephin 1 gram IV q24h, Octreotide gtt, and Protonix gtt - Continue to monitor for signs of GI bleeding. No overt GI bleeding currently.  - CIWA protocol - Advise EGD for luminal evaluation +/- variceal banding +/- biopsies +/- endoscopic hemostasis.  - EGD today with Dr. Norma Fredrickson - Strict NPO for now - See procedure note for findings and further recommendations - Follow-up outpatient for cirrhosis management/HCC surveillance   I reviewed the risks (including bleeding, perforation, infection, anesthesia complications, cardiac/respiratory complications), benefits and alternatives of EGD. Patient consents to proceed.    Thank you for the consult. Please call  with questions or concerns.  Gilda Crease, PA-C [facility] [phone]

## 2022-09-30 NOTE — Assessment & Plan Note (Signed)
-   Potassium will be replaced and magnesium will be checked. 

## 2022-09-30 NOTE — TOC Initial Note (Addendum)
Transition of Care [facility]) - Initial/Assessment Note    Patient Details  Name: Robert Sellers MRN: [id number] Date of Birth: [birth year]  Transition of Care [facility]) CM/SW Contact:    Truddie Hidden, RN Phone Number: 09/30/2022, 1:36 PM  Clinical Narrative:                 Spoke with patient at bedside regarding insurance and PCP. Family member at bedside.He stated he will have insurance in the next two week. He is started a new job. He declined resources for [employer]. Substance Abuse resources added to AVS.         Patient Goals and CMS Choice            Expected Discharge Plan and Services                                              Prior Living Arrangements/Services                       Activities of Daily Living Home Assistive Devices/Equipment: Eyeglasses ADL Screening (condition at time of admission) Patient's cognitive ability adequate to safely complete daily activities?: No Is the patient deaf or have difficulty hearing?: No Does the patient have difficulty seeing, even when wearing glasses/contacts?: No Does the patient have difficulty concentrating, remembering, or making decisions?: No Patient able to express need for assistance with ADLs?: Yes Does the patient have difficulty dressing or bathing?: No Independently performs ADLs?: No Communication: Independent Dressing (OT): Independent Grooming: Independent Feeding: Independent Bathing: Independent Toileting: Independent In/Out Bed: Independent Walks in Home: Independent Does the patient have difficulty walking or climbing stairs?: No Weakness of Legs: None Weakness of Arms/Hands: None  Permission Sought/Granted                  Emotional Assessment              Admission diagnosis:  Acute upper GI bleeding [K92.2] Patient Active Problem List   Diagnosis Date Noted   Alcohol abuse 09/30/2022   Hypokalemia 09/30/2022   Acute upper GI bleeding 09/29/2022    Left shoulder pain 08/28/2015   Hyperlipidemia 12/04/2012   PCP:  Pcp, No Pharmacy:   [id number] [facility], [place] - [address] [place] [address] Phone: [phone] Fax: [phone]  [id number] [place], [place] - [address] [place] [address] Phone: [phone] Fax: [phone]  [id number] [place], [place] - [address] [place] [address] Phone: [phone] Fax: [phone]     Social Determinants of Health (SDOH) Social History: SDOH Screenings   Food Insecurity: No Food Insecurity (09/29/2022)  Housing: Low Risk  (09/29/2022)  Transportation Needs: No Transportation Needs (09/29/2022)  Utilities: Not At Risk (09/29/2022)  Tobacco Use: Low Risk  (09/29/2022)   SDOH Interventions:     Readmission Risk Interventions     No data to display

## 2022-09-30 NOTE — Op Note (Signed)
[facility] Gastroenterology Patient Name: Robert Sellers Procedure Date: 09/30/2022 2:48 PM MRN: [id number] Account #: [id number] Date of Birth: Dec 20, 1980 Admit Type: Inpatient Age: 42 Room: [facility] ENDO ROOM 3 Gender: Male Note Status: Finalized Instrument Name: Laurette Schimke [id number] Procedure:             Upper GI endoscopy Indications:           Hematemesis, Cirrhosis with UGI bleeding suspected                         esophageal varices Providers:             Boykin Nearing. Norma Fredrickson MD, MD Referring MD:          No Local Md, MD (Referring MD) Medicines:             Propofol per Anesthesia Complications:         No immediate complications. Procedure:             Pre-Anesthesia Assessment:                        - The risks and benefits of the procedure and the                         sedation options and risks were discussed with the                         patient. All questions were answered and informed                         consent was obtained.                        - Patient identification and proposed procedure were                         verified prior to the procedure by the nurse. The                         procedure was verified in the procedure room.                        - ASA Grade Assessment: III - A patient with severe                         systemic disease.                        - After reviewing the risks and benefits, the patient                         was deemed in satisfactory condition to undergo the                         procedure.                        After obtaining informed consent, the endoscope was                         passed under  direct vision. Throughout the procedure,                         the patient's blood pressure, pulse, and oxygen                         saturations were monitored continuously. The Endoscope                         was introduced through the mouth, and advanced to the                          third part of duodenum. The upper GI endoscopy was                         accomplished without difficulty. The patient tolerated                         the procedure well. Findings:      Grade I varices were found in the lower third of the esophagus. They       were small in size. Estimated blood loss: none. There were no stigmata       of bleeding from the varices. Estimated blood loss: none.      The exam of the esophagus was otherwise normal.      Moderate portal hypertensive gastropathy was found in the gastric body.      The exam of the stomach was otherwise normal.      There is no endoscopic evidence of ulceration or varices in the entire       examined stomach.      The examined duodenum was normal.      The exam was otherwise without abnormality. Impression:            - Grade I esophageal varices.                        - Portal hypertensive gastropathy.                        - Normal examined duodenum.                        - The examination was otherwise normal.                        - No specimens collected. Recommendation:        - I anticipate no further need for intervention.                        - Serial H/H, Serial exams                        - May discontinue Octreotide                        - Advance diet as tolerated.                        - [employer] will sign off for now. Call us back if any  changes clinically or if we can help for any reason. Procedure Code(s):     --- Professional ---                        [phone], Esophagogastroduodenoscopy, flexible,                         transoral; diagnostic, including collection of                         specimen(s) by brushing or washing, when performed                         (separate procedure) Diagnosis Code(s):     --- Professional ---                        K92.2, Gastrointestinal hemorrhage, unspecified                        K74.60, Unspecified cirrhosis of liver                         K92.0, Hematemesis                        K31.89, Other diseases of stomach and duodenum                        K76.6, Portal hypertension                        I85.10, Secondary esophageal varices without bleeding CPT copyright 2022 American Medical Association. All rights reserved. The codes documented in this report are preliminary and upon coder review may  be revised to meet current compliance requirements. Stanton Kidney MD, MD 09/30/2022 3:25:42 PM This report has been signed electronically. Number of Addenda: 0 Note Initiated On: 09/30/2022 2:48 PM Estimated Blood Loss:  Estimated blood loss: none.      [facility]

## 2022-09-30 NOTE — Transfer of Care (Signed)
Immediate Anesthesia Transfer of Care Note  Patient: SHAYNE HOULT  Procedure(s) Performed: ESOPHAGOGASTRODUODENOSCOPY (EGD) WITH PROPOFOL  Patient Location: PACU and Endoscopy Unit  Anesthesia Type:General  Level of Consciousness: drowsy  Airway & Oxygen Therapy: Patient Spontanous Breathing  Post-op Assessment: Report given to RN and Post -op Vital signs reviewed and stable  Post vital signs: Reviewed and stable  Last Vitals:  Vitals Value Taken Time  BP 119/55 09/30/22 1529  Temp    Pulse 71 09/30/22 1531  Resp 19 09/30/22 1531  SpO2 98 % 09/30/22 1531  Vitals shown include unfiled device data.  Last Pain:  Vitals:   09/30/22 1529  TempSrc:   PainSc: 0-No pain      Patients Stated Pain Goal: 0 (09/29/22 2234)  Complications: No notable events documented.

## 2022-09-30 NOTE — Plan of Care (Signed)

## 2022-09-30 NOTE — Assessment & Plan Note (Addendum)
-   The patient will be admitted to a progressive unit bed. - The patient has subsequent acute blood loss anemia. - We will follow serial hemoglobins and hematocrits. - We will continue IV Protonix drip as well as IV octreotide drip. - We will continue prophylactic IV Rocephin. - We will obtain a GI consult. - Dr. Norma Fredrickson was notified about the patient.

## 2022-10-01 ENCOUNTER — Encounter: Payer: Self-pay | Admitting: Family Medicine

## 2022-10-01 DIAGNOSIS — D62 Acute posthemorrhagic anemia: Secondary | ICD-10-CM

## 2022-10-01 HISTORY — DX: Acute posthemorrhagic anemia: D62

## 2022-10-01 LAB — BASIC METABOLIC PANEL
Anion gap: 7 (ref 5–15)
BUN: 10 mg/dL (ref 6–20)
CO2: 23 mmol/L (ref 22–32)
Calcium: 8.1 mg/dL — ABNORMAL LOW (ref 8.9–10.3)
Chloride: 106 mmol/L (ref 98–111)
Creatinine, Ser: 0.6 mg/dL — ABNORMAL LOW (ref 0.61–1.24)
GFR, Estimated: >60 mL/min (ref >60)
Glucose, Bld: 118 mg/dL — ABNORMAL HIGH (ref 70–99)
Potassium: 3.8 mmol/L (ref 3.5–5.1)
Sodium: 136 mmol/L (ref 135–145)

## 2022-10-01 LAB — CBC
HCT: 31.8 % — ABNORMAL LOW (ref 39.0–52.0)
Hemoglobin: 11 g/dL — ABNORMAL LOW (ref 13.0–17.0)
MCH: 34.2 pg — ABNORMAL HIGH (ref 26.0–34.0)
MCHC: 34.6 g/dL (ref 30.0–36.0)
MCV: 98.8 fL (ref 80.0–100.0)
Platelets: 49 10*3/uL — ABNORMAL LOW (ref 150–400)
RBC: 3.22 MIL/uL — ABNORMAL LOW (ref 4.22–5.81)
RDW: 13.2 % (ref 11.5–15.5)
WBC: 4.6 10*3/uL (ref 4.0–10.5)
nRBC: 0 % (ref 0.0–0.2)

## 2022-10-01 LAB — MAGNESIUM: Magnesium: 1.4 mg/dL — ABNORMAL LOW (ref 1.7–2.4)

## 2022-10-01 MED ORDER — PROPRANOLOL HCL 10 MG PO TABS: 10.0000 mg | ORAL_TABLET | Freq: Three times a day (TID) | ORAL | 0 refills | Status: DC

## 2022-10-01 MED ORDER — MAGNESIUM SULFATE 4 GM/100ML IV SOLN
4.0000 g | Freq: Once | INTRAVENOUS | Status: AC
  Administered 2022-10-01: 4 g via INTRAVENOUS
  Filled 2022-10-01: qty 100

## 2022-10-01 MED ORDER — FOLIC ACID 1 MG PO TABS: 1.0000 mg | ORAL_TABLET | Freq: Every day | ORAL | 0 refills | Status: AC

## 2022-10-01 MED ORDER — PANTOPRAZOLE SODIUM 40 MG PO TBEC: 40.0000 mg | DELAYED_RELEASE_TABLET | Freq: Two times a day (BID) | ORAL | 11 refills | Status: DC

## 2022-10-01 MED ORDER — ADULT MULTIVITAMIN W/MINERALS CH: 1.0000 | ORAL_TABLET | Freq: Every day | ORAL | 0 refills | Status: AC

## 2022-10-01 MED ORDER — VITAMIN B-1 100 MG PO TABS: 100.0000 mg | ORAL_TABLET | Freq: Every day | ORAL | 0 refills | Status: AC

## 2022-10-01 NOTE — Discharge Summary (Addendum)
Physician Discharge Summary   Patient: Robert Sellers MRN: [id number] DOB: [birth year]  Admit date:     09/29/2022  Discharge date: 10/01/22  Discharge Physician: Milanya Sunderland   PCP: Pcp, No   Recommendations at discharge:   Abstain from further alcohol use Take medications as recommended Follow up with gastroenterology as an outpatient  Discharge Diagnoses: Principal Problem:   Acute upper GI bleeding Active Problems:   Hypokalemia   Acute blood loss anemia   Alcohol abuse  Resolved Problems:   * No resolved hospital problems. *  Hospital Course:  Robert Sellers is a 42 y.o. Caucasian male with medical history significant for hypertension dyslipidemia, as well as esophageal varices with history of alcohol abuse, who presented to the emergency room with acute onset of nausea and vomiting with hematemesis of partly fresh blood and coffee-ground as well as epigastric abdominal pain.  He denied any melena or bright red blood per rectum. No dysuria, oliguria or hematuria or flank pain.  He admitted to mild headache and dizziness without loss of consciousness.  No cough or wheezing or hemoptysis.  No chest pain or palpitations.   ED Course: When he came to the ER, BP was 144/83 with respiratory to 20 and heart rate of 120 and otherwise normal vital signs.  Labs revealed mild hypokalemia of 3.4 and a CO2 of 21 with anion gap of 16.Marland Kitchen  AST was 148 and ALT 49 with a total protein of 8.4 and total bili of 3.7.  CBC was within normal except for thrombocytopenia of 89.  Repeat H&H were 12.5 and 35.7 compared to 14.5 and 41.4 earlier.  PT was 15.8 and INR 1.2 with PTT of 29.  Blood group was O+ with negative antibody screen. EKG as reviewed by me : EKG showed sinus tachycardia with a rate of 106 with minimal ST segment depression and prolonged QT interval with QTc of 572 MS. Imaging: Abdominal pelvic CT scan revealed the following: 1. Fatty infiltration of the liver. Hepatic cirrhosis with  diffuse upper abdominal varices and splenic enlargement. 2. Bilateral nonobstructing intrarenal stones. Right hydronephrosis without obstructing stone demonstrated. Ureters are decompressed. 3. Minimal aortic atherosclerosis. 4. 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   The patient was given 4 mg of IV Zofran and 80 mg of IV Protonix bolus and a drip, octreotide bolus and drip, 1 L bolus of IV normal saline and 1 g of IV Rocephin.  He will be admitted to a rest of unit bed for further evaluation and management.     Assessment and Plan:  * Acute blood loss anemia Secondary to upper GI bleeding   Patient with a known history of alcoholic liver cirrhosis and esophageal varices who presents for evaluation of hematemesis. Upper endoscopy showed grade 1 varices found in the lower third of the esophagus.  These were small in size with no stigmata of bleeding.  Patient noted to have moderate portal hypertensive gastropathy found in the gastric body. Has not had any further episodes of bleeding and H&H is stable. Appreciate GI input. Patient will be discharged home on Protonix Follow-up with gastroenterology in 1 month    Alcoholic liver cirrhosis with portal hypertension Patient with significant thrombocytopenia and evidence of portal hypertensive gastropathy as well as esophageal varices Maintain low-sodium diet Will discharge patient on propranolol 10 mg 3 times a day     Hypokalemia/hypomagnesemia Secondary to GI losses Supplemented    Alcohol abuse - Patient counseled on  the need to abstain from further alcohol use Did not have any signs and symptoms of alcohol withdrawal during this hospitalization         Consultants: Gastroenterology Procedures performed: Upper endoscopy Disposition: Home Diet recommendation:  Discharge Diet Orders (From admission, onward)     Start     Ordered   10/01/22 0000  Diet - low sodium heart  healthy        10/01/22 1328           Cardiac diet DISCHARGE MEDICATION: Allergies as of 10/01/2022   No Known Allergies      Medication List     TAKE these medications    folic acid 1 MG tablet Commonly known as: FOLVITE Take 1 tablet (1 mg total) by mouth daily. Start taking on: October 02, 2022   multivitamin with minerals Tabs tablet Take 1 tablet by mouth daily. Start taking on: October 02, 2022   pantoprazole 40 MG tablet Commonly known as: Protonix Take 1 tablet (40 mg total) by mouth 2 (two) times daily.   propranolol 10 MG tablet Commonly known as: INDERAL Take 1 tablet (10 mg total) by mouth 3 (three) times daily.   thiamine 100 MG tablet Commonly known as: Vitamin B-1 Take 1 tablet (100 mg total) by mouth daily. Start taking on: October 02, 2022        Discharge Exam: Ceasar Mons Weights   09/30/22 4098  Weight: 86.3 kg   GENERAL:  42 y.o.-year-old Caucasian male patient lying in the bed with no acute distress.  EYES: Pupils equal, round, reactive to light and accommodation. No scleral icterus. Extraocular muscles intact.  HEENT: Head atraumatic, normocephalic. Oropharynx and nasopharynx clear.  NECK:  Supple, no jugular venous distention. No thyroid enlargement, no tenderness.  LUNGS: Normal breath sounds bilaterally, no wheezing, rales,rhonchi or crepitation. No use of accessory muscles of respiration.  CARDIOVASCULAR: Regular rate and rhythm, S1, S2 normal. No murmurs, rubs, or gallops.  ABDOMEN: Soft, nondistended, nontender. Bowel sounds present. No organomegaly or mass.  EXTREMITIES: No pedal edema, cyanosis, or clubbing.  NEUROLOGIC: Cranial nerves II through XII are intact. Muscle strength 5/5 in all extremities. Sensation intact. Gait not checked.  PSYCHIATRIC: The patient is alert and oriented x 3.  Normal affect and good eye contact. SKIN: No obvious rash, lesion, or ulcer.     Condition at discharge: stable  The results of significant  diagnostics from this hospitalization (including imaging, microbiology, ancillary and laboratory) are listed below for reference.   Imaging Studies: CT ABDOMEN PELVIS W CONTRAST  Result Date: 09/29/2022 CLINICAL DATA:  Acute nonlocalized abdominal pain. Alcohol use. Previous history of kidney stones. EXAM: CT ABDOMEN AND PELVIS WITH CONTRAST TECHNIQUE: Multidetector CT imaging of the abdomen and pelvis was performed using the standard protocol following bolus administration of intravenous contrast. RADIATION DOSE REDUCTION: This exam was performed according to the departmental dose-optimization program which includes automated exposure control, adjustment of the mA and/or kV according to patient size and/or use of iterative reconstruction technique. CONTRAST:  OMNIPAQUE IOHEXOL 300 MG/ML  SOLN COMPARISON:  None Available. FINDINGS: Lower chest: Lung bases are clear. Hepatobiliary: Diffuse fatty infiltration of the liver. Nodular liver contour likely indicating cirrhosis. No focal lesions. Portal veins are patent. Pancreas: Gallbladder is distended. No wall thickening or stones. No bile duct dilatation. Spleen: Spleen is enlarged.  No focal lesions.  Calcified granuloma. Adrenals/Urinary Tract: No adrenal gland nodules. Bilateral renal stones with several stones in the right and single stone  on the left. Largest in the right lower pole measures about 1.1 cm diameter. Mild right hydronephrosis without hydroureter. No specific obstructing stones are demonstrated. Bladder is normal. Stomach/Bowel: Stomach is within normal limits. Appendix appears normal. No evidence of bowel wall thickening, distention, or inflammatory changes. Vascular/Lymphatic: Diffuse upper abdominal varices. Minimal calcification of the aorta. No aneurysm. No significant lymphadenopathy. Reproductive: Prostate is unremarkable. Other: No abdominal wall hernia or abnormality. No abdominopelvic ascites. Musculoskeletal: Mild curvilinear  sclerosis in the femoral heads bilaterally suggesting avascular necrosis. IMPRESSION: 1. Fatty infiltration of the liver. Hepatic cirrhosis with diffuse upper abdominal varices and splenic enlargement. 2. Bilateral nonobstructing intrarenal stones. Right hydronephrosis without obstructing stone demonstrated. Ureters are decompressed. 3. Minimal aortic atherosclerosis. 4. 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 Electronically Signed   By: Burman Nieves M.D.   On: 09/29/2022 19:50    Microbiology: Results for orders placed or performed in visit on 08/12/15  Urine Culture     Status: None   Collection Time: 08/12/15  2:06 PM   Specimen: Urine  Result Value Ref Range Status   Colony Count NO GROWTH  Final   Organism ID, Bacteria NO GROWTH  Final    Labs: CBC: Recent Labs  Lab 09/29/22 1801 09/29/22 2220 09/30/22 0317 09/30/22 0919 10/01/22 1243  WBC 6.3  --  3.4*  --  4.6  HGB 14.5 12.5* 11.1* 11.4* 11.0*  HCT 41.4 35.7* 32.9* 33.9* 31.8*  MCV 94.5  --  97.9  --  98.8  PLT 89*  --  49*  --  49*   Basic Metabolic Panel: Recent Labs  Lab 09/29/22 1801 09/30/22 0317 10/01/22 1243  NA 136 136 136  K 3.4* 3.9 3.8  CL 99 101 106  CO2 21* 26 23  GLUCOSE 146* 165* 118*  BUN 9 10 10   CREATININE 0.84 0.65 0.60*  CALCIUM 9.1 7.9* 8.1*  MG  --   --  1.4*   Liver Function Tests: Recent Labs  Lab 09/29/22 1801  AST 148*  ALT 49*  ALKPHOS 98  BILITOT 3.7*  PROT 8.4*  ALBUMIN 3.9   CBG: No results for input(s): "GLUCAP" in the last 168 hours.  Discharge time spent: greater than 30 minutes.  Signed: Lucile Shutters, MD [facility] 10/01/2022

## 2022-11-11 ENCOUNTER — Encounter: Payer: BC Managed Care – PPO | Admitting: Urology

## 2022-11-30 ENCOUNTER — Encounter: Payer: BC Managed Care – PPO | Admitting: Urology

## 2023-03-17 ENCOUNTER — Other Ambulatory Visit: Payer: Self-pay

## 2023-03-18 ENCOUNTER — Encounter: Payer: Self-pay | Admitting: General Practice

## 2023-03-18 ENCOUNTER — Ambulatory Visit: Payer: BC Managed Care – PPO | Admitting: General Practice

## 2023-03-18 VITALS — BP 120/68 | HR 83 | Temp 97.9°F | Ht 69.0 in | Wt 191.0 lb

## 2023-03-18 DIAGNOSIS — Z8679 Personal history of other diseases of the circulatory system: Secondary | ICD-10-CM | POA: Diagnosis not present

## 2023-03-18 DIAGNOSIS — Z8639 Personal history of other endocrine, nutritional and metabolic disease: Secondary | ICD-10-CM | POA: Insufficient documentation

## 2023-03-18 DIAGNOSIS — G4700 Insomnia, unspecified: Secondary | ICD-10-CM | POA: Diagnosis not present

## 2023-03-18 DIAGNOSIS — N210 Calculus in bladder: Secondary | ICD-10-CM | POA: Diagnosis not present

## 2023-03-18 DIAGNOSIS — F1010 Alcohol abuse, uncomplicated: Secondary | ICD-10-CM | POA: Diagnosis not present

## 2023-03-18 DIAGNOSIS — Z7689 Persons encountering health services in other specified circumstances: Secondary | ICD-10-CM | POA: Insufficient documentation

## 2023-03-18 MED ORDER — TRAZODONE HCL 100 MG PO TABS
100.0000 mg | ORAL_TABLET | Freq: Every evening | ORAL | 0 refills | Status: DC | PRN
Start: 2023-03-18 — End: 2023-04-16

## 2023-03-18 NOTE — Assessment & Plan Note (Signed)
EMR reviewed briefly.

## 2023-03-18 NOTE — Progress Notes (Signed)
New Patient Office Visit  Subjective    Patient ID: Robert Sellers, male    DOB: 1980-06-09  Age: 43 y.o. MRN: [id number]  CC:  Chief Complaint  Patient presents with   New Patient (Initial Visit)    HPI Robert Sellers is a 43 y.o. male presents to establish care.  Previous PCP/physical/labs: several years ago.   HLD: diagnosed several year ago. Controlled with diet. Has been on Crestor in the past. Has been on off for at least two year.   HTN: Diagnosed several years ago. Controlled with medication for a few months. Off of meds for several year ago. Has been managing his diet and exercise. He does check his BP at home.   Anxiety- previously diagnosed and treated with sertraline. Off of meds for several years now.   Insomnia- combination of staying asleep and falling asleep. Goes to bed around 10:30-11 and lies awake for sometime and then wakes up around 7 am. Does wake up 1 time to use the bathroom after falling asleep and then it does take sometime to fall asleep again. He used Trazodone in the past and he would get atleast 6-7 hours of sleep at night.    Outpatient Encounter Medications as of 03/18/2023  Medication Sig   [DISCONTINUED] traZODone (DESYREL) 100 MG tablet Take 100 mg by mouth at bedtime.   traZODone (DESYREL) 100 MG tablet Take 1 tablet (100 mg total) by mouth at bedtime as needed for sleep. Take 1-2 tablets by mouth at bedtime as needed for sleep. Do not take more than two.   No facility-administered encounter medications on file as of 03/18/2023.    Past Medical History:  Diagnosis Date   Acute blood loss anemia 10/01/2022   Acute upper GI bleeding 09/29/2022   Anxiety    Clotting disorder (HCC)    Depression    Hyperlipidemia 12/04/2012   Hypertension    Left shoulder pain 08/28/2015   Substance abuse (HCC)     Past Surgical History:  Procedure Laterality Date   ESOPHAGOGASTRODUODENOSCOPY (EGD) WITH PROPOFOL N/A 09/30/2022   Procedure:  ESOPHAGOGASTRODUODENOSCOPY (EGD) WITH PROPOFOL;  Surgeon: Toledo, Boykin Nearing, MD;  Location: [facility] ENDOSCOPY;  Service: Gastroenterology;  Laterality: N/A;   KNEE SURGERY     left shoulder surgery Left 2024   [facility]    Family History  Problem Relation Age of Onset   Heart disease Mother    Hyperlipidemia Mother    Hypertension Mother    Diabetes Mother    Early death Mother    Obesity Mother    Heart disease Father    Hyperlipidemia Father    Hypertension Father    Diabetes Father    Alcohol abuse Father    Early death Father    Kidney disease Father    Obesity Father     Social History   Socioeconomic History   Marital status: Divorced    Spouse name: Not on file   Number of children: 3   Years of education: Not on file   Highest education level: Master's degree (e.g., MA, MS, MEng, MEd, MSW, MBA)  Occupational History   Not on file  Tobacco Use   Smoking status: Never   Smokeless tobacco: Never  Vaping Use   Vaping status: Never Used  Substance and Sexual Activity   Alcohol use: Yes    Alcohol/week: 1.0 standard drink of alcohol    Types: 1 Standard drinks or equivalent per week    Comment: occassional  Drug use: No   Sexual activity: Yes    Partners: Female    Birth control/protection: None    Comment: single partner  Other Topics Concern   Not on file  Social History Narrative   Not on file   Social Drivers of Health   Financial Resource Strain: Medium Risk (03/17/2023)   Overall Financial Resource Strain (CARDIA)    Difficulty of Paying Living Expenses: Somewhat hard  Food Insecurity: Food Insecurity Present (03/17/2023)   Hunger Vital Sign    Worried About Running Out of Food in the Last Year: Sometimes true    Ran Out of Food in the Last Year: Sometimes true  Transportation Needs: No Transportation Needs (03/17/2023)   PRAPARE - Administrator, Civil Service (Medical): No    Lack of Transportation (Non-Medical): No  Physical Activity:  Insufficiently Active (03/17/2023)   Exercise Vital Sign    Days of Exercise per Week: 3 days    Minutes of Exercise per Session: 30 min  Stress: Stress Concern Present (03/17/2023)   [employer] of Occupational Health - Occupational Stress Questionnaire    Feeling of Stress : Very much  Social Connections: Socially Isolated (03/17/2023)   Social Connection and Isolation Panel [NHANES]    Frequency of Communication with Friends and Family: Once a week    Frequency of Social Gatherings with Friends and Family: Once a week    Attends Religious Services: Never    Database administrator or Organizations: No    Attends Engineer, structural: Not on file    Marital Status: Divorced  Intimate Partner Violence: Not At Risk (09/29/2022)   Humiliation, Afraid, Rape, and Kick questionnaire    Fear of Current or Ex-Partner: No    Emotionally Abused: No    Physically Abused: No    Sexually Abused: No    Review of Systems  Constitutional:  Negative for chills and fever.  Respiratory:  Negative for shortness of breath.   Cardiovascular:  Negative for chest pain.  Gastrointestinal:  Negative for abdominal pain, constipation, diarrhea, heartburn, nausea and vomiting.  Genitourinary:  Negative for dysuria, frequency and urgency.  Neurological:  Negative for dizziness and headaches.  Endo/Heme/Allergies:  Negative for polydipsia.  Psychiatric/Behavioral:  Negative for depression and suicidal ideas. The patient is not nervous/anxious.         Objective    BP 120/68 (BP Location: Left Arm, Patient Position: Sitting, Cuff Size: Normal)   Pulse 83   Temp 97.9 F (36.6 C) (Oral)   Ht 5\' 9"  (1.753 m)   Wt 191 lb (86.6 kg)   SpO2 99%   BMI 28.21 kg/m   Physical Exam Vitals and nursing note reviewed.  Constitutional:      Appearance: Normal appearance.  Cardiovascular:     Rate and Rhythm: Normal rate and regular rhythm.     Pulses: Normal pulses.     Heart sounds: Normal heart  sounds.  Pulmonary:     Effort: Pulmonary effort is normal.     Breath sounds: Normal breath sounds.  Neurological:     Mental Status: He is alert and oriented to person, place, and time.  Psychiatric:        Mood and Affect: Mood normal.        Behavior: Behavior normal.        Thought Content: Thought content normal.        Judgment: Judgment normal.         Assessment &  Plan:  Insomnia, unspecified type Assessment & Plan: Chronic. Does use good sleeping habits. Avoids phone and caffeine.  Unclear etiology.  Has been using Trazodone sparingly.   Refill sent.   Orders: -     traZODone HCl; Take 1 tablet (100 mg total) by mouth at bedtime as needed for sleep. Take 1-2 tablets by mouth at bedtime as needed for sleep. Do not take more than two.  Dispense: 90 tablet; Refill: 0  Bladder stones Assessment & Plan: Controlled.  Would like to establish with urology.   Referral placed.  Orders: -     Ambulatory referral to Urology  Alcohol abuse Assessment & Plan: Controlled.  One drink a week and that's on occasion.    Establishing care with new doctor, encounter for Assessment & Plan: EMR reviewed briefly.   History of hypertension Assessment & Plan: Controlled. BP at goal.  Continue to monitor diet and exercise.    History of hyperlipidemia Assessment & Plan: Declines chest pan, shortness of breath, difficulty breathing.  Will check lipid panel in August.       Return in about 6 months (around 09/15/2023) for physical and labs.Modesto Charon, NP

## 2023-03-18 NOTE — Assessment & Plan Note (Signed)
Chronic. Does use good sleeping habits. Avoids phone and caffeine.  Unclear etiology.  Has been using Trazodone sparingly.   Refill sent.

## 2023-03-18 NOTE — Assessment & Plan Note (Signed)
Controlled.  One drink a week and that's on occasion.

## 2023-03-18 NOTE — Assessment & Plan Note (Addendum)
Controlled. BP at goal.  Continue to monitor diet and exercise.

## 2023-03-18 NOTE — Patient Instructions (Addendum)
Refill sent for Trazodone.   Continue to monitor diet and exercise.   Monitor BP at home. Report readings consistently above 140/90 or below 100/60.   Follow up in August for a physical.   It was a pleasure meeting you!

## 2023-03-18 NOTE — Assessment & Plan Note (Signed)
Controlled.  Would like to establish with urology.   Referral placed.

## 2023-03-18 NOTE — Assessment & Plan Note (Signed)
Declines chest pan, shortness of breath, difficulty breathing.  Will check lipid panel in August.

## 2023-04-06 ENCOUNTER — Encounter: Payer: Self-pay | Admitting: *Deleted

## 2023-04-10 ENCOUNTER — Ambulatory Visit
Admission: EM | Admit: 2023-04-10 | Discharge: 2023-04-10 | Disposition: A | Discharge status: Home / Self Care | Attending: Emergency Medicine | Admitting: Emergency Medicine

## 2023-04-10 DIAGNOSIS — J069 Acute upper respiratory infection, unspecified: Secondary | ICD-10-CM

## 2023-04-10 MED ORDER — AZITHROMYCIN 250 MG PO TABS: 250.0000 mg | ORAL_TABLET | Freq: Every day | ORAL | 0 refills | Status: DC

## 2023-04-10 NOTE — Discharge Instructions (Addendum)
 Take the Zithromax as directed.  Follow up with your primary care provider if your symptoms are not improving.

## 2023-04-10 NOTE — ED Triage Notes (Signed)
 Patient to Urgent Care with complaints of nasal congestion/ shortness of breath/ fatigue/ sinus drainage/ mucus  Symptoms started approx one week ago. Exposed to walking pneumonia.

## 2023-04-10 NOTE — ED Provider Notes (Signed)
 Robert Sellers    CSN: [id number] Arrival date & time: 04/10/23  1458      History   Chief Complaint Chief Complaint  Patient presents with   Shortness of Breath    HPI Robert Sellers is a 43 y.o. male.  Patient presents with 1 week history of congestion, cough, fatigue.  He has shortness of breath with coughing episodes.  No fever, chest pain, vomiting, diarrhea.  No OTC medications taken today.    The history is provided by the patient and medical records.    Past Medical History:  Diagnosis Date   Acute blood loss anemia 10/01/2022   Acute upper GI bleeding 09/29/2022   Anxiety    Clotting disorder (HCC)    Depression    Hyperlipidemia 12/04/2012   Hypertension    Left shoulder pain 08/28/2015   Substance abuse [facility])     Patient Active Problem List   Diagnosis Date Noted   Insomnia 03/18/2023   Establishing care with new doctor, encounter for 03/18/2023   History of hypertension 03/18/2023   History of hyperlipidemia 03/18/2023   Alcohol abuse 09/30/2022   Bladder stones 06/04/2019    Past Surgical History:  Procedure Laterality Date   ESOPHAGOGASTRODUODENOSCOPY (EGD) WITH PROPOFOL N/A 09/30/2022   Procedure: ESOPHAGOGASTRODUODENOSCOPY (EGD) WITH PROPOFOL;  Surgeon: Toledo, Boykin Nearing, MD;  Location: [facility] ENDOSCOPY;  Service: Gastroenterology;  Laterality: N/A;   KNEE SURGERY     left shoulder surgery Left 2024   [facility]       Home Medications    Prior to Admission medications   Medication Sig Start Date End Date Taking? Authorizing Provider  azithromycin (ZITHROMAX) 250 MG tablet Take 1 tablet (250 mg total) by mouth daily. Take first 2 tablets together, then 1 every day until finished. 04/10/23  Yes Mickie Bail, NP  traZODone (DESYREL) 100 MG tablet Take 1 tablet (100 mg total) by mouth at bedtime as needed for sleep. Take 1-2 tablets by mouth at bedtime as needed for sleep. Do not take more than two. 03/18/23   [place] Charon, NP    Family  History Family History  Problem Relation Age of Onset   Heart disease Mother    Hyperlipidemia Mother    Hypertension Mother    Diabetes Mother    Early death Mother    Obesity Mother    Heart disease Father    Hyperlipidemia Father    Hypertension Father    Diabetes Father    Alcohol abuse Father    Early death Father    Kidney disease Father    Obesity Father     Social History Social History   Tobacco Use   Smoking status: Never   Smokeless tobacco: Never  Vaping Use   Vaping status: Never Used  Substance Use Topics   Alcohol use: Yes    Alcohol/week: 1.0 standard drink of alcohol    Types: 1 Standard drinks or equivalent per week    Comment: occassional   Drug use: No     Allergies   Patient has no known allergies.   Review of Systems Review of Systems  Constitutional:  Negative for chills and fever.  HENT:  Positive for congestion, postnasal drip and rhinorrhea. Negative for ear pain and sore throat.   Respiratory:  Positive for cough and shortness of breath.   Cardiovascular:  Negative for chest pain and palpitations.  Gastrointestinal:  Negative for diarrhea and vomiting.     Physical Exam Triage Vital Signs  ED Triage Vitals [04/10/23 1510]  Encounter Vitals Group     BP 133/87     Systolic BP Percentile      Diastolic BP Percentile      Pulse Rate 72     Resp 18     Temp 98.7 F (37.1 C)     Temp src      SpO2 95 %     Weight      Height      Head Circumference      Peak Flow      Pain Score      Pain Loc      Pain Education      Exclude from Growth Chart    No data found.  Updated Vital Signs BP 133/87   Pulse 72   Temp 98.7 F (37.1 C)   Resp 18   SpO2 95%   Visual Acuity Right Eye Distance:   Left Eye Distance:   Bilateral Distance:    Right Eye Near:   Left Eye Near:    Bilateral Near:     Physical Exam Constitutional:      General: He is not in acute distress. HENT:     Right Ear: Tympanic membrane normal.      Left Ear: Tympanic membrane normal.     Nose: Rhinorrhea present.     Mouth/Throat:     Mouth: Mucous membranes are moist.     Pharynx: Oropharynx is clear.  Cardiovascular:     Rate and Rhythm: Normal rate and regular rhythm.     Heart sounds: Normal heart sounds.  Pulmonary:     Effort: Pulmonary effort is normal. No respiratory distress.     Breath sounds: Normal breath sounds.  Neurological:     Mental Status: He is alert.      UC Treatments / Results  Labs (all labs ordered are listed, but only abnormal results are displayed) Labs Reviewed - No data to display  EKG   Radiology No results found.  Procedures Procedures (including critical care time)  Medications Ordered in UC Medications - No data to display  Initial Impression / Assessment and Plan / UC Course  I have reviewed the triage vital signs and the nursing notes.  Pertinent labs & imaging results that were available during my care of the patient were reviewed by me and considered in my medical decision making (see chart for details).    Acute upper respiratory infection.  Lungs are clear and O2 sat is 95% on room air.  Afebrile and vital signs are stable.  Patient has been symptomatic for a week.  Treating today with Zithromax.  Instructed patient to follow-up with his PCP if he is not improving.  ED precautions given.  Education provided on upper respiratory infection.  He agrees to plan of care.  Final Clinical Impressions(s) / UC Diagnoses   Final diagnoses:  Acute upper respiratory infection     Discharge Instructions      Take the Zithromax as directed.  Follow-up with your primary care provider if your symptoms are not improving.      ED Prescriptions     Medication Sig Dispense Auth. Provider   azithromycin (ZITHROMAX) 250 MG tablet Take 1 tablet (250 mg total) by mouth daily. Take first 2 tablets together, then 1 every day until finished. 6 tablet Mickie Bail, NP      PDMP not  reviewed this encounter.   Mickie Bail, NP 04/10/23 [id number]

## 2023-04-12 ENCOUNTER — Encounter
Admission: EM | Disposition: A | Discharge status: Home Health | Payer: Self-pay | Source: Home / Self Care | Attending: Internal Medicine

## 2023-04-12 ENCOUNTER — Inpatient Hospital Stay: Admitting: Anesthesiology

## 2023-04-12 ENCOUNTER — Other Ambulatory Visit: Payer: Self-pay

## 2023-04-12 ENCOUNTER — Emergency Department

## 2023-04-12 ENCOUNTER — Inpatient Hospital Stay
Admission: EM | Admit: 2023-04-12 | Discharge: 2023-04-16 | DRG: 432 | Disposition: A | Discharge status: Home Health | Attending: Internal Medicine | Admitting: Internal Medicine

## 2023-04-12 DIAGNOSIS — D6959 Other secondary thrombocytopenia: Secondary | ICD-10-CM | POA: Diagnosis present

## 2023-04-12 DIAGNOSIS — D689 Coagulation defect, unspecified: Secondary | ICD-10-CM | POA: Diagnosis present

## 2023-04-12 DIAGNOSIS — I10 Essential (primary) hypertension: Secondary | ICD-10-CM | POA: Diagnosis not present

## 2023-04-12 DIAGNOSIS — F1011 Alcohol abuse, in remission: Secondary | ICD-10-CM | POA: Diagnosis not present

## 2023-04-12 DIAGNOSIS — E663 Overweight: Secondary | ICD-10-CM | POA: Insufficient documentation

## 2023-04-12 DIAGNOSIS — I959 Hypotension, unspecified: Secondary | ICD-10-CM | POA: Diagnosis not present

## 2023-04-12 DIAGNOSIS — E785 Hyperlipidemia, unspecified: Secondary | ICD-10-CM | POA: Diagnosis present

## 2023-04-12 DIAGNOSIS — R5381 Other malaise: Secondary | ICD-10-CM | POA: Diagnosis present

## 2023-04-12 DIAGNOSIS — K7030 Alcoholic cirrhosis of liver without ascites: Secondary | ICD-10-CM | POA: Diagnosis not present

## 2023-04-12 DIAGNOSIS — Z6827 Body mass index (BMI) 27.0-27.9, adult: Secondary | ICD-10-CM

## 2023-04-12 DIAGNOSIS — Z833 Family history of diabetes mellitus: Secondary | ICD-10-CM

## 2023-04-12 DIAGNOSIS — F419 Anxiety disorder, unspecified: Secondary | ICD-10-CM | POA: Diagnosis present

## 2023-04-12 DIAGNOSIS — K920 Hematemesis: Secondary | ICD-10-CM | POA: Diagnosis not present

## 2023-04-12 DIAGNOSIS — Z8249 Family history of ischemic heart disease and other diseases of the circulatory system: Secondary | ICD-10-CM

## 2023-04-12 DIAGNOSIS — K922 Gastrointestinal hemorrhage, unspecified: Secondary | ICD-10-CM | POA: Diagnosis present

## 2023-04-12 DIAGNOSIS — K766 Portal hypertension: Secondary | ICD-10-CM | POA: Diagnosis present

## 2023-04-12 DIAGNOSIS — Z811 Family history of alcohol abuse and dependence: Secondary | ICD-10-CM

## 2023-04-12 DIAGNOSIS — Z79899 Other long term (current) drug therapy: Secondary | ICD-10-CM

## 2023-04-12 DIAGNOSIS — R000 Tachycardia, unspecified: Secondary | ICD-10-CM | POA: Diagnosis not present

## 2023-04-12 DIAGNOSIS — M25511 Pain in right shoulder: Secondary | ICD-10-CM | POA: Insufficient documentation

## 2023-04-12 DIAGNOSIS — D696 Thrombocytopenia, unspecified: Secondary | ICD-10-CM

## 2023-04-12 DIAGNOSIS — R0689 Other abnormalities of breathing: Secondary | ICD-10-CM | POA: Diagnosis not present

## 2023-04-12 DIAGNOSIS — E8809 Other disorders of plasma-protein metabolism, not elsewhere classified: Secondary | ICD-10-CM | POA: Diagnosis present

## 2023-04-12 DIAGNOSIS — W1811XA Fall from or off toilet without subsequent striking against object, initial encounter: Secondary | ICD-10-CM | POA: Diagnosis present

## 2023-04-12 DIAGNOSIS — K7031 Alcoholic cirrhosis of liver with ascites: Secondary | ICD-10-CM

## 2023-04-12 DIAGNOSIS — K7010 Alcoholic hepatitis without ascites: Secondary | ICD-10-CM | POA: Diagnosis not present

## 2023-04-12 DIAGNOSIS — M25521 Pain in right elbow: Secondary | ICD-10-CM | POA: Diagnosis not present

## 2023-04-12 DIAGNOSIS — F1010 Alcohol abuse, uncomplicated: Secondary | ICD-10-CM | POA: Diagnosis present

## 2023-04-12 DIAGNOSIS — I8510 Secondary esophageal varices without bleeding: Secondary | ICD-10-CM | POA: Diagnosis not present

## 2023-04-12 DIAGNOSIS — Z83438 Family history of other disorder of lipoprotein metabolism and other lipidemia: Secondary | ICD-10-CM

## 2023-04-12 DIAGNOSIS — I8500 Esophageal varices without bleeding: Secondary | ICD-10-CM | POA: Diagnosis not present

## 2023-04-12 DIAGNOSIS — R7989 Other specified abnormal findings of blood chemistry: Secondary | ICD-10-CM

## 2023-04-12 DIAGNOSIS — F32A Depression, unspecified: Secondary | ICD-10-CM | POA: Diagnosis present

## 2023-04-12 DIAGNOSIS — M25532 Pain in left wrist: Secondary | ICD-10-CM | POA: Diagnosis not present

## 2023-04-12 DIAGNOSIS — D62 Acute posthemorrhagic anemia: Secondary | ICD-10-CM | POA: Diagnosis not present

## 2023-04-12 DIAGNOSIS — K625 Hemorrhage of anus and rectum: Secondary | ICD-10-CM | POA: Diagnosis not present

## 2023-04-12 DIAGNOSIS — Z841 Family history of disorders of kidney and ureter: Secondary | ICD-10-CM

## 2023-04-12 DIAGNOSIS — I8511 Secondary esophageal varices with bleeding: Secondary | ICD-10-CM | POA: Diagnosis present

## 2023-04-12 DIAGNOSIS — R9431 Abnormal electrocardiogram [ECG] [EKG]: Secondary | ICD-10-CM | POA: Diagnosis not present

## 2023-04-12 DIAGNOSIS — E871 Hypo-osmolality and hyponatremia: Secondary | ICD-10-CM | POA: Insufficient documentation

## 2023-04-12 DIAGNOSIS — M25539 Pain in unspecified wrist: Secondary | ICD-10-CM | POA: Insufficient documentation

## 2023-04-12 DIAGNOSIS — M25531 Pain in right wrist: Secondary | ICD-10-CM | POA: Diagnosis present

## 2023-04-12 DIAGNOSIS — S62306D Unspecified fracture of fifth metacarpal bone, right hand, subsequent encounter for fracture with routine healing: Secondary | ICD-10-CM | POA: Diagnosis not present

## 2023-04-12 HISTORY — PX: GASTRIC VARICES BANDING: SHX5519

## 2023-04-12 HISTORY — PX: ESOPHAGOGASTRODUODENOSCOPY: SHX5428

## 2023-04-12 LAB — COMPREHENSIVE METABOLIC PANEL
ALT: 40 U/L (ref 0–44)
AST: 127 U/L — ABNORMAL HIGH (ref 15–41)
Albumin: 1.9 g/dL — ABNORMAL LOW (ref 3.5–5.0)
Alkaline Phosphatase: 69 U/L (ref 38–126)
Anion gap: 13 (ref 5–15)
BUN: 18 mg/dL (ref 6–20)
CO2: 22 mmol/L (ref 22–32)
Calcium: 7.1 mg/dL — ABNORMAL LOW (ref 8.9–10.3)
Chloride: 102 mmol/L (ref 98–111)
Creatinine, Ser: 0.73 mg/dL (ref 0.61–1.24)
GFR, Estimated: >60 mL/min (ref >60)
Glucose, Bld: 174 mg/dL — ABNORMAL HIGH (ref 70–99)
Potassium: 4.9 mmol/L (ref 3.5–5.1)
Sodium: 137 mmol/L (ref 135–145)
Total Bilirubin: 4.7 mg/dL — ABNORMAL HIGH (ref 0.0–1.2)
Total Protein: 4.9 g/dL — ABNORMAL LOW (ref 6.5–8.1)

## 2023-04-12 LAB — ETHANOL: Alcohol, Ethyl (B): 216 mg/dL — ABNORMAL HIGH (ref <10)

## 2023-04-12 LAB — APTT: aPTT: 34 s (ref 24–36)

## 2023-04-12 LAB — HEMOGLOBIN AND HEMATOCRIT, BLOOD
HCT: 24.6 % — ABNORMAL LOW (ref 39.0–52.0)
Hemoglobin: 8.2 g/dL — ABNORMAL LOW (ref 13.0–17.0)

## 2023-04-12 LAB — CBC
HCT: 21.6 % — ABNORMAL LOW (ref 39.0–52.0)
Hemoglobin: 7 g/dL — ABNORMAL LOW (ref 13.0–17.0)
MCH: 32.1 pg (ref 26.0–34.0)
MCHC: 32.4 g/dL (ref 30.0–36.0)
MCV: 99.1 fL (ref 80.0–100.0)
Platelets: 81 10*3/uL — ABNORMAL LOW (ref 150–400)
RBC: 2.18 MIL/uL — ABNORMAL LOW (ref 4.22–5.81)
RDW: 18.1 % — ABNORMAL HIGH (ref 11.5–15.5)
WBC: 8.4 10*3/uL (ref 4.0–10.5)
nRBC: 0 % (ref 0.0–0.2)

## 2023-04-12 LAB — PROTIME-INR
INR: 1.8 — ABNORMAL HIGH (ref 0.8–1.2)
Prothrombin Time: 20.9 s — ABNORMAL HIGH (ref 11.4–15.2)

## 2023-04-12 LAB — SAMPLE TO BLOOD BANK

## 2023-04-12 LAB — PREPARE RBC (CROSSMATCH)

## 2023-04-12 SURGERY — EGD (ESOPHAGOGASTRODUODENOSCOPY)
Anesthesia: General

## 2023-04-12 MED ORDER — THIAMINE HCL 100 MG/ML IJ SOLN
100.0000 mg | Freq: Every day | INTRAMUSCULAR | Status: DC
  Administered 2023-04-13 – 2023-04-14 (×2): 100 mg via INTRAVENOUS
  Filled 2023-04-12: qty 2
  Filled 2023-04-12 (×2): qty 1
  Filled 2023-04-12: qty 2

## 2023-04-12 MED ORDER — LIDOCAINE HCL (CARDIAC) PF 100 MG/5ML IV SOSY
PREFILLED_SYRINGE | INTRAVENOUS | Status: DC | PRN
  Administered 2023-04-12: 70 mg via INTRAVENOUS

## 2023-04-12 MED ORDER — OXYCODONE HCL 5 MG PO TABS
10.0000 mg | ORAL_TABLET | Freq: Four times a day (QID) | ORAL | Status: DC | PRN
  Administered 2023-04-13 – 2023-04-15 (×7): 10 mg via ORAL
  Filled 2023-04-12 (×7): qty 2

## 2023-04-12 MED ORDER — FOLIC ACID 1 MG PO TABS
1.0000 mg | ORAL_TABLET | Freq: Every day | ORAL | Status: DC
  Administered 2023-04-12 – 2023-04-16 (×4): 1 mg via ORAL
  Filled 2023-04-12 (×5): qty 1

## 2023-04-12 MED ORDER — SUCCINYLCHOLINE CHLORIDE 200 MG/10ML IV SOSY
PREFILLED_SYRINGE | INTRAVENOUS | Status: AC
  Filled 2023-04-12: qty 10

## 2023-04-12 MED ORDER — FENTANYL CITRATE (PF) 100 MCG/2ML IJ SOLN
INTRAMUSCULAR | Status: DC | PRN
  Administered 2023-04-12: 50 ug via INTRAVENOUS

## 2023-04-12 MED ORDER — LACTATED RINGERS IV SOLN: INTRAVENOUS | Status: DC | PRN

## 2023-04-12 MED ORDER — LORAZEPAM 2 MG/ML IJ SOLN
1.0000 mg | INTRAMUSCULAR | Status: AC | PRN
  Administered 2023-04-12: 2 mg via INTRAVENOUS
  Filled 2023-04-12: qty 1

## 2023-04-12 MED ORDER — SODIUM CHLORIDE 0.9 % IV SOLN
260.0000 mg | Freq: Once | INTRAVENOUS | Status: AC
  Administered 2023-04-12: 260 mg via INTRAVENOUS
  Filled 2023-04-12: qty 2

## 2023-04-12 MED ORDER — ADULT MULTIVITAMIN W/MINERALS CH
1.0000 | ORAL_TABLET | Freq: Every day | ORAL | Status: DC
  Administered 2023-04-12 – 2023-04-16 (×4): 1 via ORAL
  Filled 2023-04-12 (×5): qty 1

## 2023-04-12 MED ORDER — SODIUM CHLORIDE 0.9 % IV SOLN
50.0000 ug/h | INTRAVENOUS | Status: DC
  Administered 2023-04-12 – 2023-04-16 (×8): 50 ug/h via INTRAVENOUS
  Filled 2023-04-12 (×12): qty 1

## 2023-04-12 MED ORDER — PANTOPRAZOLE SODIUM 40 MG IV SOLR
40.0000 mg | Freq: Once | INTRAVENOUS | Status: AC
  Administered 2023-04-12: 40 mg via INTRAVENOUS
  Filled 2023-04-12: qty 10

## 2023-04-12 MED ORDER — PHENYLEPHRINE 80 MCG/ML (10ML) SYRINGE FOR IV PUSH (FOR BLOOD PRESSURE SUPPORT)
PREFILLED_SYRINGE | INTRAVENOUS | Status: DC | PRN
  Administered 2023-04-12: 80 ug via INTRAVENOUS

## 2023-04-12 MED ORDER — MORPHINE SULFATE (PF) 4 MG/ML IV SOLN
4.0000 mg | Freq: Once | INTRAVENOUS | Status: AC
  Administered 2023-04-12: 4 mg via INTRAVENOUS
  Filled 2023-04-12: qty 1

## 2023-04-12 MED ORDER — SODIUM CHLORIDE 0.9 % IV SOLN: Freq: Once | INTRAVENOUS | Status: AC

## 2023-04-12 MED ORDER — LORAZEPAM 1 MG PO TABS
1.0000 mg | ORAL_TABLET | ORAL | Status: AC | PRN
  Administered 2023-04-13: 1 mg via ORAL
  Filled 2023-04-12: qty 1

## 2023-04-12 MED ORDER — FENTANYL CITRATE (PF) 100 MCG/2ML IJ SOLN
INTRAMUSCULAR | Status: AC
  Filled 2023-04-12: qty 2

## 2023-04-12 MED ORDER — DEXAMETHASONE SODIUM PHOSPHATE 10 MG/ML IJ SOLN
INTRAMUSCULAR | Status: DC | PRN
Start: 2023-04-12 — End: 2023-04-12
  Administered 2023-04-12: 10 mg via INTRAVENOUS

## 2023-04-12 MED ORDER — MORPHINE SULFATE (PF) 2 MG/ML IV SOLN
2.0000 mg | INTRAVENOUS | Status: DC | PRN
  Administered 2023-04-12 – 2023-04-13 (×7): 2 mg via INTRAVENOUS
  Filled 2023-04-12 (×6): qty 1

## 2023-04-12 MED ORDER — ROCURONIUM BROMIDE 10 MG/ML (PF) SYRINGE
PREFILLED_SYRINGE | INTRAVENOUS | Status: AC
  Filled 2023-04-12: qty 10

## 2023-04-12 MED ORDER — PHENYLEPHRINE 80 MCG/ML (10ML) SYRINGE FOR IV PUSH (FOR BLOOD PRESSURE SUPPORT)
PREFILLED_SYRINGE | INTRAVENOUS | Status: AC
  Filled 2023-04-12: qty 10

## 2023-04-12 MED ORDER — SUCCINYLCHOLINE CHLORIDE 200 MG/10ML IV SOSY
PREFILLED_SYRINGE | INTRAVENOUS | Status: DC | PRN
  Administered 2023-04-12: 100 mg via INTRAVENOUS

## 2023-04-12 MED ORDER — ONDANSETRON HCL 4 MG/2ML IJ SOLN
4.0000 mg | Freq: Four times a day (QID) | INTRAMUSCULAR | Status: DC | PRN
  Administered 2023-04-12 – 2023-04-13 (×2): 4 mg via INTRAVENOUS
  Filled 2023-04-12 (×3): qty 2

## 2023-04-12 MED ORDER — SODIUM CHLORIDE 0.9 % IV SOLN: 10.0000 mL/h | Freq: Once | INTRAVENOUS | Status: DC

## 2023-04-12 MED ORDER — LORAZEPAM 2 MG/ML IJ SOLN: 1.0000 mg | INTRAMUSCULAR | Status: DC | PRN

## 2023-04-12 MED ORDER — ONDANSETRON HCL 4 MG/2ML IJ SOLN
INTRAMUSCULAR | Status: DC | PRN
  Administered 2023-04-12: 4 mg via INTRAVENOUS

## 2023-04-12 MED ORDER — ONDANSETRON HCL 4 MG PO TABS: 4.0000 mg | ORAL_TABLET | Freq: Four times a day (QID) | ORAL | Status: DC | PRN

## 2023-04-12 MED ORDER — ONDANSETRON HCL 4 MG/2ML IJ SOLN
INTRAMUSCULAR | Status: AC
  Filled 2023-04-12: qty 2

## 2023-04-12 MED ORDER — PANTOPRAZOLE SODIUM 40 MG IV SOLR
40.0000 mg | Freq: Two times a day (BID) | INTRAVENOUS | Status: DC
  Administered 2023-04-12 – 2023-04-15 (×7): 40 mg via INTRAVENOUS
  Filled 2023-04-12 (×7): qty 10

## 2023-04-12 MED ORDER — PHENOBARBITAL SODIUM 130 MG/ML IJ SOLN
97.5000 mg | Freq: Three times a day (TID) | INTRAMUSCULAR | Status: AC
  Administered 2023-04-13 – 2023-04-14 (×5): 97.5 mg via INTRAVENOUS
  Filled 2023-04-12: qty 1
  Filled 2023-04-12: qty 0.75
  Filled 2023-04-12 (×3): qty 1
  Filled 2023-04-12: qty 0.75

## 2023-04-12 MED ORDER — DEXAMETHASONE SODIUM PHOSPHATE 10 MG/ML IJ SOLN
INTRAMUSCULAR | Status: AC
  Filled 2023-04-12: qty 1

## 2023-04-12 MED ORDER — MIDAZOLAM HCL 2 MG/2ML IJ SOLN
INTRAMUSCULAR | Status: DC | PRN
  Administered 2023-04-12: 2 mg via INTRAVENOUS

## 2023-04-12 MED ORDER — THIAMINE MONONITRATE 100 MG PO TABS
100.0000 mg | ORAL_TABLET | Freq: Every day | ORAL | Status: DC
  Administered 2023-04-12 – 2023-04-16 (×4): 100 mg via ORAL
  Filled 2023-04-12 (×5): qty 1

## 2023-04-12 MED ORDER — PHENOBARBITAL SODIUM 65 MG/ML IJ SOLN: 32.5000 mg | Freq: Three times a day (TID) | INTRAMUSCULAR | Status: DC

## 2023-04-12 MED ORDER — PHENOBARBITAL SODIUM 65 MG/ML IJ SOLN
65.0000 mg | Freq: Three times a day (TID) | INTRAMUSCULAR | Status: DC
  Administered 2023-04-15 – 2023-04-16 (×4): 65 mg via INTRAVENOUS
  Filled 2023-04-12 (×4): qty 1

## 2023-04-12 MED ORDER — MIDAZOLAM HCL 2 MG/2ML IJ SOLN
INTRAMUSCULAR | Status: AC
  Filled 2023-04-12: qty 2

## 2023-04-12 MED ORDER — SODIUM CHLORIDE 0.9 % IV SOLN: INTRAVENOUS | Status: DC

## 2023-04-12 MED ORDER — PROPOFOL 10 MG/ML IV BOLUS
INTRAVENOUS | Status: DC | PRN
  Administered 2023-04-12: 150 mg via INTRAVENOUS

## 2023-04-12 MED ORDER — ONDANSETRON HCL 4 MG/2ML IJ SOLN
4.0000 mg | Freq: Once | INTRAMUSCULAR | Status: AC
  Administered 2023-04-12: 4 mg via INTRAVENOUS
  Filled 2023-04-12: qty 2

## 2023-04-12 MED ORDER — LIDOCAINE HCL (PF) 2 % IJ SOLN
INTRAMUSCULAR | Status: AC
  Filled 2023-04-12: qty 5

## 2023-04-12 MED ORDER — OCTREOTIDE LOAD VIA INFUSION
50.0000 ug | Freq: Once | INTRAVENOUS | Status: AC
  Administered 2023-04-12: 50 ug via INTRAVENOUS
  Filled 2023-04-12: qty 25

## 2023-04-12 NOTE — ED Notes (Signed)
Report given to Endo.

## 2023-04-12 NOTE — Anesthesia Procedure Notes (Signed)
 Procedure Name: Intubation Date/Time: 04/12/2023 3:26 PM  Performed by: Otho Perl, CRNAPre-anesthesia Checklist: Patient identified, Patient being monitored, Timeout performed, Emergency Drugs available and Suction available Patient Re-evaluated:Patient Re-evaluated prior to induction Oxygen Delivery Method: Circle system utilized Preoxygenation: Pre-oxygenation with 100% oxygen Induction Type: IV induction Ventilation: Mask ventilation without difficulty Laryngoscope Size: 3 and McGrath Grade View: Grade I Tube type: Oral Tube size: 7.0 mm Number of attempts: 1 Airway Equipment and Method: Stylet Placement Confirmation: ETT inserted through vocal cords under direct vision, positive ETCO2 and breath sounds checked- equal and bilateral Secured at: 21 cm Tube secured with: Tape Dental Injury: Teeth and Oropharynx as per pre-operative assessment

## 2023-04-12 NOTE — Transfer of Care (Signed)
 Immediate Anesthesia Transfer of Care Note  Patient: Robert Sellers  Procedure(s) Performed: EGD (ESOPHAGOGASTRODUODENOSCOPY) BAND LIGATION, GASTRIC VARICES  Patient Location: Endoscopy Unit  Anesthesia Type:General  Level of Consciousness: awake  Airway & Oxygen Therapy: Patient Spontanous Breathing and Patient connected to face mask oxygen  Post-op Assessment: Report given to RN and Post -op Vital signs reviewed and stable  Post vital signs: Reviewed and stable  Last Vitals:  Vitals Value Taken Time  BP 142/72 04/12/23 1553  Temp    Pulse 112 04/12/23 1553  Resp 23 04/12/23 1553  SpO2 98 % 04/12/23 1553    Last Pain:  Vitals:   04/12/23 1553  TempSrc:   PainSc: Asleep         Complications: No notable events documented.

## 2023-04-12 NOTE — ED Triage Notes (Signed)
 Pt to ED via [facility] from home for c/o vomiting blood since 11pm last night. Pt has hx esophageal varices and states this is similar.Pt states possible LOC and fall with pain in right shoulder and arm. Denies thinners, denies hitting head. Given Zofran and 500 mL LR by EMS.  BP 94/53 HR 85 Cbg 193

## 2023-04-12 NOTE — H&P (Signed)
 HISTORY AND PHYSICAL    Robert Sellers   ZOX:[id number] DOB: Nov 21, 1980   Date of Service: 04/12/23 Requesting physician/APP from ED: Treatment Team:  Attending Provider: Sunnie Nielsen, DO  PCP: Robert Charon, NP     HPI: Robert Sellers is a 43 y.o. male with an extensive history of alcohol abuse who came in with hematemesis.  The patient has a history of upper GI bleeding in the past and had an upper endoscopy by Dr. Norma Sellers in August 2024 with grade 1 esophageal varices with no intervention done at that time.  He reports that he drinks approximately a pint of whiskey or vodka a day but his wife states that it is been much more than that. Pt confirms has been vomiting up blood few days now.   Hospital course:  In ED, Hgb 7.0. Started on protonix and octreotide. 2 units PRBC. Admitted to hospitalist service. GI performed EGD - Grade III varices were found in the lower third of the esophagus. Five bands were successfully placed with incomplete eradication of varices. Continue octreotide 72h. If further bleeding will need TIPS.     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)   GI bleed  Esophageal Varices  Likely alcoholic  Hx EtOH use and recent NSAID use  GI following Monitor bleeding/CBC Continue octreotide 72h Continue PPI If further bleeding will need TIPS  EtOH use High risk for withdrawals  CIWA protocol  Phenobarb taper per pharmacy  Fall precautions Telemetry   Recent fall Bruising and pain d/t fall  Pain control Fall precautions   DVT prophylaxis: SCD Pertinent IV fluids/nutrition: CLD following EGD Central lines / invasive devices: none  Code Status: FULL CODE Family Communication: wife at bedside  Disposition: inpatient TOC needs: TBD Barriers to  discharge / significant pending items: octreotide infusion 72h          Review of Systems:  Review of Systems  Constitutional:  Positive for malaise/fatigue. Negative for chills and fever.  Respiratory:  Positive for cough and hemoptysis. Negative for shortness of breath.   Cardiovascular:  Negative for chest pain, palpitations and orthopnea.  Musculoskeletal:  Positive for myalgias.  Neurological:  Positive for dizziness and weakness (generalized). Negative for tremors, focal weakness and seizures.  Psychiatric/Behavioral:  Positive for substance abuse (EtOH). Negative for depression, hallucinations and memory loss.        has a past medical history of Acute blood loss anemia (10/01/2022), Acute upper GI bleeding (09/29/2022), Anxiety, Clotting disorder (HCC), Depression, Hyperlipidemia (12/04/2012), Hypertension, Left shoulder pain (08/28/2015), and Substance abuse (HCC).  No current facility-administered medications on file prior to encounter.   Current Outpatient Medications on File Prior to Encounter  Medication Sig Dispense Refill   traZODone (DESYREL) 100 MG tablet Take 1 tablet (100 mg total) by mouth at bedtime as needed for sleep. Take 1-2 tablets by mouth at bedtime as needed for sleep. Do not take more than two. 90 tablet 0   azithromycin (ZITHROMAX) 250 MG tablet Take 1 tablet (250 mg total) by mouth daily. Take first 2 tablets together, then 1 every day until finished. (Patient not taking: Reported on 04/12/2023) 6 tablet 0     No Known Allergies    family history includes Alcohol abuse in his father; Diabetes in his father and  mother; Early death in his father and mother; Heart disease in his father and mother; Hyperlipidemia in his father and mother; Hypertension in his father and mother; Kidney disease in his father; Obesity in his father and mother.   Past Surgical History:  Procedure Laterality Date   ESOPHAGOGASTRODUODENOSCOPY (EGD) WITH PROPOFOL N/A  09/30/2022   Procedure: ESOPHAGOGASTRODUODENOSCOPY (EGD) WITH PROPOFOL;  Surgeon: Toledo, Robert Nearing, MD;  Location: [facility] ENDOSCOPY;  Service: Gastroenterology;  Laterality: N/A;   KNEE SURGERY     left shoulder surgery Left 2024   [facility]          Objective Findings:  Vitals:   04/12/23 1445 04/12/23 1553 04/12/23 1605 04/12/23 1616  BP: 120/69 (!) 142/72 108/80 121/76  Pulse: 94 (!) 112 (!) 119 (!) 104  Resp: 18 (!) 23 19 20   Temp: (!) 96.9 F (36.1 C)   (!) 97.1 F (36.2 C)  TempSrc: Temporal   Temporal  SpO2: 96% 98% 94% 95%  Weight: 86.2 kg     Height: 5\' 10"  (1.778 m)       Intake/Output Summary (Last 24 hours) at 04/12/2023 1657 Last data filed at 04/12/2023 1551 Gross per 24 hour  Intake 938.12 ml  Output --  Net 938.12 ml   Filed Weights   04/12/23 0937 04/12/23 1445  Weight: 86.2 kg 86.2 kg    Examination:  Physical Exam Constitutional:      General: He is not in acute distress.    Appearance: He is ill-appearing.  HENT:     Head:     Comments: Swelling around L eye, EOMI Cardiovascular:     Rate and Rhythm: Normal rate and regular rhythm.  Pulmonary:     Effort: Pulmonary effort is normal.     Breath sounds: Normal breath sounds.  Abdominal:     General: Abdomen is flat. Bowel sounds are normal. There is no distension.     Palpations: Abdomen is soft.     Comments: Bruising on abdomen   Musculoskeletal:     Right lower leg: No edema.     Left lower leg: No edema.  Skin:    General: Skin is warm and dry.     Findings: Bruising present.  Neurological:     General: No focal deficit present.     Mental Status: He is alert and oriented to person, place, and time.  Psychiatric:        Mood and Affect: Mood normal.        Behavior: Behavior normal.          Scheduled Medications:   folic acid  1 mg Oral Daily   multivitamin with minerals  1 tablet Oral Daily   [MAR Hold] pantoprazole (PROTONIX) IV  40 mg Intravenous Q12H   thiamine  100  mg Oral Daily   Or   thiamine  100 mg Intravenous Daily    Continuous Infusions:  [MAR Hold] sodium chloride Stopped (04/12/23 1111)   sodium chloride 20 mL/hr at 04/12/23 1453   octreotide (SANDOSTATIN) 500 mcg in sodium chloride 0.9 % 250 mL (2 mcg/mL) infusion 50 mcg/hr (04/12/23 1144)    PRN Medications:  LORazepam **OR** LORazepam, [MAR Hold]  morphine injection, [MAR Hold] ondansetron **OR** [MAR Hold] ondansetron (ZOFRAN) IV  Antimicrobials:  Anti-infectives (From admission, onward)    None           Data Reviewed: I have personally reviewed following labs and imaging studies  CBC: Recent Labs  Lab 04/12/23 0935  WBC 8.4  HGB 7.0*  HCT 21.6*  MCV 99.1  PLT 81*   Basic Metabolic Panel: Recent Labs  Lab 04/12/23 0935  NA 137  K 4.9  CL 102  CO2 22  GLUCOSE 174*  BUN 18  CREATININE 0.73  CALCIUM 7.1*   GFR: Estimated Creatinine Clearance: 124.2 mL/min (by C-G formula based on SCr of 0.73 mg/dL). Liver Function Tests: Recent Labs  Lab 04/12/23 0935  AST 127*  ALT 40  ALKPHOS 69  BILITOT 4.7*  PROT 4.9*  ALBUMIN 1.9*   No results for input(s): "LIPASE", "AMYLASE" in the last 168 hours. No results for input(s): "AMMONIA" in the last 168 hours. Coagulation Profile: Recent Labs  Lab 04/12/23 0935  INR 1.8*   Cardiac Enzymes: No results for input(s): "CKTOTAL", "CKMB", "CKMBINDEX", "TROPONINI" in the last 168 hours. BNP (last 3 results) No results for input(s): "PROBNP" in the last 8760 hours. HbA1C: No results for input(s): "HGBA1C" in the last 72 hours. CBG: No results for input(s): "GLUCAP" in the last 168 hours. Lipid Profile: No results for input(s): "CHOL", "HDL", "LDLCALC", "TRIG", "CHOLHDL", "LDLDIRECT" in the last 72 hours. Thyroid Function Tests: No results for input(s): "TSH", "T4TOTAL", "FREET4", "T3FREE", "THYROIDAB" in the last 72 hours. Anemia Panel: No results for input(s): "VITAMINB12", "FOLATE", "FERRITIN", "TIBC",  "IRON", "RETICCTPCT" in the last 72 hours. Most Recent Urinalysis On File:     Component Value Date/Time   COLORURINE AMBER (A) 09/30/2022 0516   APPEARANCEUR HAZY (A) 09/30/2022 0516   LABSPEC 1.041 (H) 09/30/2022 0516   PHURINE 7.0 09/30/2022 0516   GLUCOSEU NEGATIVE 09/30/2022 0516   HGBUR LARGE (A) 09/30/2022 0516   BILIRUBINUR NEGATIVE 09/30/2022 0516   BILIRUBINUR Negative 10/21/2016 1642   KETONESUR 20 (A) 09/30/2022 0516   PROTEINUR 100 (A) 09/30/2022 0516   UROBILINOGEN negative (A) 10/21/2016 1642   NITRITE NEGATIVE 09/30/2022 0516   LEUKOCYTESUR NEGATIVE 09/30/2022 0516   Sepsis Labs: @LABRCNTIP (procalcitonin:4,lacticidven:4)  No results found for this or any previous visit (from the past 240 hours).       Radiology Studies: DG Elbow Complete Right Result Date: 04/12/2023 CLINICAL DATA:  Pain after fall. EXAM: RIGHT ELBOW - COMPLETE 3+ VIEW COMPARISON:  None Available. FINDINGS: There is no evidence of fracture, dislocation, or joint effusion. The alignment and joint spaces are preserved. There is no evidence of arthropathy or other focal bone abnormality. Soft tissues are unremarkable. IMPRESSION: Negative radiographs of the right elbow. Electronically Signed   By: Narda Rutherford M.D.   On: 04/12/2023 12:39   DG Wrist Complete Right Result Date: 04/12/2023 CLINICAL DATA:  Pain after fall. EXAM: RIGHT WRIST - COMPLETE 3+ VIEW COMPARISON:  None Available. FINDINGS: There is no evidence of fracture or dislocation. Alignment and joint spaces are preserved. Healed fifth metacarpal fracture appears remote. Minor degenerative spurring. Soft tissues are unremarkable. IMPRESSION: 1. No acute fracture or subluxation of the right wrist. 2. Healed fifth metacarpal fracture. Electronically Signed   By: Narda Rutherford M.D.   On: 04/12/2023 12:38   DG Wrist Complete Left Result Date: 04/12/2023 CLINICAL DATA:  Pain after fall. EXAM: LEFT WRIST - COMPLETE 3+ VIEW COMPARISON:  None  Available. FINDINGS: There is no evidence of fracture or dislocation. Alignment and joint spaces are preserved. Occasional degenerative spurring. Soft tissues are unremarkable. IMPRESSION: No fracture or subluxation of the left wrist. Electronically Signed   By: Narda Rutherford M.D.   On: 04/12/2023 12:38   DG Shoulder Right Portable Result Date: 04/12/2023  CLINICAL DATA:  Pain after fall. EXAM: RIGHT SHOULDER - 1 VIEW COMPARISON:  None Available. FINDINGS: There is no evidence of fracture or dislocation. The alignment and joint spaces are preserved. There is no evidence of arthropathy or other focal bone abnormality. Soft tissues are unremarkable. IMPRESSION: Negative radiographs of the right shoulder. Electronically Signed   By: Narda Rutherford M.D.   On: 04/12/2023 12:37             LOS: 0 days      Robert Nielsen, DO [facility] 04/12/2023, 4:57 PM    Dictation software may have been used to generate the above note. Typos may occur and escape review in typed/dictated notes. Please contact Dr Lyn Hollingshead directly for clarity if needed.  Staff may message me via secure chat in Epic  but this may not receive an immediate response,  please page me for urgent matters!  If 7PM-7AM, please contact night coverage [email or web address]

## 2023-04-12 NOTE — Anesthesia Preprocedure Evaluation (Signed)
 Anesthesia Evaluation  Patient identified by MRN, date of birth, ID band Patient awake  General Assessment Comment:  Active hematemesis. Of note, patient had a fall from seated position this morning, injuring his face and right arm. X rays of upper extremities with no evidence of fracture. Did not get his head scanned  Reviewed: Allergy & Precautions, NPO status , Patient's Chart, lab work & pertinent test results  History of Anesthesia Complications Negative for: history of anesthetic complications  Airway Mallampati: II  TM Distance: >3 FB Neck ROM: Full   Comment: Blood in beard Dental  (+) Teeth Intact   Pulmonary neg pulmonary ROS, neg sleep apnea, neg COPD, Patient abstained from smoking.Not current smoker   Pulmonary exam normal breath sounds clear to auscultation       Cardiovascular Exercise Tolerance: Good METShypertension, Pt. on medications (-) CAD and (-) Past MI (-) dysrhythmias  Rhythm:Regular Rate:Normal - Systolic murmurs    Neuro/Psych  PSYCHIATRIC DISORDERS Anxiety Depression    negative neurological ROS     GI/Hepatic ,neg GERD  ,,(+) Cirrhosis   Esophageal Varices  substance abuse  alcohol useDaily drinker. Has had shakes and "the DT's" after cessation of alcohol. Denies ever having withdrawal seizures.   Endo/Other  neg diabetes    Renal/GU negative Renal ROS     Musculoskeletal   Abdominal   Peds  Hematology  (+) Blood dyscrasia, anemia Thrombocytopenia    Anesthesia Other Findings Past Medical History: 12/04/2012: Hyperlipidemia No date: Hypertension  Reproductive/Obstetrics                              Anesthesia Physical Anesthesia Plan  ASA: 3  Anesthesia Plan: General   Post-op Pain Management: Minimal or no pain anticipated   Induction: Intravenous and Rapid sequence  PONV Risk Score and Plan: 2 and Ondansetron, Dexamethasone, Midazolam and  Treatment may vary due to age or medical condition  Airway Management Planned: Oral ETT and Video Laryngoscope Planned  Additional Equipment: None  Intra-op Plan:   Post-operative Plan: Extubation in OR  Informed Consent: I have reviewed the patients History and Physical, chart, labs and discussed the procedure including the risks, benefits and alternatives for the proposed anesthesia with the patient or authorized representative who has indicated his/her understanding and acceptance.     Dental advisory given  Plan Discussed with: CRNA and Surgeon  Anesthesia Plan Comments: (Discussed risks of anesthesia with patient, including PONV, sore throat, lip/dental/eye damage, aspiration. Rare risks discussed as well, such as cardiorespiratory and neurological sequelae, and allergic reactions. Discussed the role of CRNA in patient's perioperative care. Patient understands. Encouraged alcohol cessation, and finding support and treatment options of alcoholism.)        Anesthesia Quick Evaluation

## 2023-04-12 NOTE — Consult Note (Signed)
 Midge Minium, MD [facility]  [address], [address] [place], [place] [address] Phone: [phone] Fax : [phone]  Consultation  Referring Provider:     Dr. Cyril Loosen Primary Care Physician:  Modesto Charon, NP Primary Gastroenterologist: Gentry Fitz         Reason for Consultation:     Upper GI bleed  Date of Admission:  04/12/2023 Date of Consultation:  04/12/2023         HPI:   Robert Sellers is a 43 y.o. male with an extensive history of alcohol abuse who came in with hematemesis.  The patient has a history of upper GI bleeding in the past and had an upper endoscopy by Dr. Norma Fredrickson in August 2024 with grade 1 esophageal varices with no intervention done at that time.  He reports that he drinks approximately a pint of whiskey or vodka a day but his wife states that it is been much more than that.  He also reported that he had started vomiting yesterday evening and then again at 9:15 this morning.  While in the room talking to him he had another episode of hematemesis with clots and maroon blood.  The patient has had abnormal liver enzymes going back 6 years and 6 months ago appeared to have a total bilirubin of 3.7 with labs consistent with alcoholic hepatitis.  The patient now comes in with an elevated AST and bilirubin up to 4.7.  The patient also was found to have a significant drop in his hemoglobin with his labs showing:   Component     Latest Ref Rng 09/30/2022 10/01/2022 04/12/2023  Hemoglobin     13.0 - 17.0 g/dL 53.6 (L)  64.4 (L)  7.0 (L)   Hemoglobin      11.1 (L)     HCT     39.0 - 52.0 % 33.9 (L)  31.8 (L)  21.6 (L)   HCT      32.9 (L)     MCV     80.0 - 100.0 fL 97.9  98.8  99.1      Past Medical History:  Diagnosis Date   Acute blood loss anemia 10/01/2022   Acute upper GI bleeding 09/29/2022   Anxiety    Clotting disorder (HCC)    Depression    Hyperlipidemia 12/04/2012   Hypertension    Left shoulder pain 08/28/2015   Substance abuse (HCC)     Past Surgical  History:  Procedure Laterality Date   ESOPHAGOGASTRODUODENOSCOPY (EGD) WITH PROPOFOL N/A 09/30/2022   Procedure: ESOPHAGOGASTRODUODENOSCOPY (EGD) WITH PROPOFOL;  Surgeon: Toledo, Boykin Nearing, MD;  Location: [facility] ENDOSCOPY;  Service: Gastroenterology;  Laterality: N/A;   KNEE SURGERY     left shoulder surgery Left 2024   [facility]    Prior to Admission medications   Medication Sig Start Date End Date Taking? Authorizing Provider  traZODone (DESYREL) 100 MG tablet Take 1 tablet (100 mg total) by mouth at bedtime as needed for sleep. Take 1-2 tablets by mouth at bedtime as needed for sleep. Do not take more than two. 03/18/23  Yes Modesto Charon, NP  azithromycin (ZITHROMAX) 250 MG tablet Take 1 tablet (250 mg total) by mouth daily. Take first 2 tablets together, then 1 every day until finished. Patient not taking: Reported on 04/12/2023 04/10/23   Mickie Bail, NP    Family History  Problem Relation Age of Onset   Heart disease Mother    Hyperlipidemia Mother    Hypertension Mother  Diabetes Mother    Early death Mother    Obesity Mother    Heart disease Father    Hyperlipidemia Father    Hypertension Father    Diabetes Father    Alcohol abuse Father    Early death Father    Kidney disease Father    Obesity Father      Social History   Tobacco Use   Smoking status: Never   Smokeless tobacco: Never  Vaping Use   Vaping status: Never Used  Substance Use Topics   Alcohol use: Yes    Alcohol/week: 1.0 standard drink of alcohol    Types: 1 Standard drinks or equivalent per week    Comment: occassional   Drug use: No    Allergies as of 04/12/2023   (No Known Allergies)    Review of Systems:    All systems reviewed and negative except where noted in HPI.   Physical Exam:  Vital signs in last 24 hours: Temp:  [97.6 F (36.4 C)-98.4 F (36.9 C)] 98.4 F (36.9 C) (03/11 1158) Pulse Rate:  [67-85] 80 (03/11 1158) Resp:  [15-18] 16 (03/11 1158) BP: (90-115)/(43-70) 101/57  (03/11 1158) SpO2:  [93 %-100 %] 93 % (03/11 1158) Weight:  [86.2 kg] 86.2 kg (03/11 0937)   General:   Pleasant, cooperative in NAD Head:  Normocephalic and atraumatic. Eyes:   No icterus.   Conjunctiva pink. PERRLA. Ears:  Normal auditory acuity. Neck:  Supple; no masses or thyroidomegaly Lungs: Respirations even and unlabored. Lungs clear to auscultation bilaterally.   No wheezes, crackles, or rhonchi.  Heart:  Regular rate and rhythm;  Without murmur, clicks, rubs or gallops Abdomen:  Soft, nondistended, nontender. Normal bowel sounds. No appreciable masses or hepatomegaly.  No rebound or guarding.  Rectal:  Not performed. Msk:  Symmetrical without gross deformities.   Extremities:  Without edema, cyanosis or clubbing. Neurologic:  Alert and oriented x3;  grossly normal neurologically. Skin:  Intact without significant lesions or rashes. Cervical Nodes:  No significant cervical adenopathy. Psych:  Alert and cooperative. Normal affect.  LAB RESULTS: Recent Labs    04/12/23 0935  WBC 8.4  HGB 7.0*  HCT 21.6*  PLT 81*   BMET Recent Labs    04/12/23 0935  NA 137  K 4.9  CL 102  CO2 22  GLUCOSE 174*  BUN 18  CREATININE 0.73  CALCIUM 7.1*   LFT Recent Labs    04/12/23 0935  PROT 4.9*  ALBUMIN 1.9*  AST 127*  ALT 40  ALKPHOS 69  BILITOT 4.7*   PT/INR Recent Labs    04/12/23 0935  LABPROT 20.9*  INR 1.8*    STUDIES: No results found.    Impression / Plan:   Assessment: Principal Problem:   GI bleed   Robert Sellers is a 43 y.o. y/o male with with a history of alcohol abuse and grade 1 esophageal varices seen on his last EGD in August.  The patient now has been taking NSAIDs for body aches and pains.  He also states that he has been having chest congestion as another cause for his NSAID use.  The patient had active vomiting of clots while I was in the room with him today.  The patient has been typed and crossed and is getting blood  transfused  Plan: . The patient will be set up for an EGD for today.  The differential diagnosis includes the patient having a variceal bleed versus peptic ulcer disease with  his NSAID use.  The patient and his wife have been explained the plan.  PPI IV twice daily  Continue serial CBCs and transfuse PRN Avoid NSAIDs Maintain 2 large-bore IV lines Please page GI with any acute hemodynamic changes, or signs of active GI bleeding   Thank you for involving me in the care of this patient.      LOS: 0 days   Midge Minium, MD, MD. Clementeen Graham 04/12/2023, 12:04 PM,  Pager [phone] 7am-5pm  Check AMION for 5pm -7am coverage and on weekends   Note: This dictation was prepared with Dragon dictation along with smaller phrase technology. Any transcriptional errors that result from this process are unintentional.

## 2023-04-12 NOTE — Op Note (Signed)
 [facility]astroenterology Patient Name: Robert Sellers Procedure Date: 04/12/2023 3:16 PM MRN: [id number] Account #: [id number] Date of Birth: Jul 26, 1980 Admit Type: Inpatient Age: 43 Room: [facility] ENDO ROOM 1 Gender: Male Note Status: Finalized Instrument Name: Upper Endoscope [id number] Procedure:             Upper GI endoscopy Indications:           Hematemesis Providers:             Midge Minium MD, MD Referring MD:          No Local Md, MD (Referring MD) Medicines:             General Anesthesia Complications:         No immediate complications. Procedure:             Pre-Anesthesia Assessment:                        - Prior to the procedure, a History and Physical was                         performed, and patient medications and allergies were                         reviewed. The patient's tolerance of previous                         anesthesia was also reviewed. The risks and benefits                         of the procedure and the sedation options and risks                         were discussed with the patient. All questions were                         answered, and informed consent was obtained. Prior                         Anticoagulants: The patient has taken no anticoagulant                         or antiplatelet agents. ASA Grade Assessment: III - A                         patient with severe systemic disease. After reviewing                         the risks and benefits, the patient was deemed in                         satisfactory condition to undergo the procedure.                        After obtaining informed consent, the endoscope was                         passed under direct vision. Throughout the procedure,  the patient's blood pressure, pulse, and oxygen                         saturations were monitored continuously. The Endoscope                         was introduced through the mouth, and advanced to the                          second part of duodenum. The upper GI endoscopy was                         accomplished without difficulty. The patient tolerated                         the procedure well. Findings:      Grade III varices were found in the lower third of the esophagus. Five       bands were successfully placed with incomplete eradication of varices.       There was no bleeding during the procedure.      The stomach was normal.      The examined duodenum was normal. Impression:            - Grade III esophageal varices. Incompletely                         eradicated. Banded.                        - Normal stomach.                        - Normal examined duodenum.                        - No specimens collected. Recommendation:        - Return patient to hospital ward for ongoing care.                        - Clear liquid diet.                        - Continue present medications.                        - Continue octreotide for 72 hours.                        - If any further bleeding then TIPS by IR. Procedure Code(s):     --- Professional ---                        [phone], Esophagogastroduodenoscopy, flexible,                         transoral; with band ligation of esophageal/gastric                         varices Diagnosis Code(s):     --- Professional ---  K92.0, Hematemesis                        I85.00, Esophageal varices without bleeding CPT copyright 2022 [employer] Association. All rights reserved. The codes documented in this report are preliminary and upon coder review may  be revised to meet current compliance requirements. Midge Minium MD, MD 04/12/2023 4:01:37 PM This report has been signed electronically. Number of Addenda: 0 Note Initiated On: 04/12/2023 3:16 PM Estimated Blood Loss:  Estimated blood loss: none.      [facility]

## 2023-04-12 NOTE — ED Notes (Signed)
 MD at bedside.   Pt had one episode of emesis.

## 2023-04-12 NOTE — Anesthesia Postprocedure Evaluation (Signed)
 Anesthesia Post Note  Patient: TRISTIAN SICKINGER  Procedure(s) Performed: EGD (ESOPHAGOGASTRODUODENOSCOPY) BAND LIGATION, GASTRIC VARICES  Patient location during evaluation: Endoscopy Anesthesia Type: General Level of consciousness: awake and alert Pain management: pain level controlled Vital Signs Assessment: post-procedure vital signs reviewed and stable Respiratory status: spontaneous breathing, nonlabored ventilation, respiratory function stable and patient connected to nasal cannula oxygen Cardiovascular status: blood pressure returned to baseline and stable Postop Assessment: no apparent nausea or vomiting Anesthetic complications: no   No notable events documented.   Last Vitals:  Vitals:   04/12/23 1605 04/12/23 1616  BP: 108/80 121/76  Pulse: (!) 119 (!) 104  Resp: 19 20  Temp:  (!) 36.2 C  SpO2: 94% 95%    Last Pain:  Vitals:   04/12/23 1616  TempSrc: Temporal  PainSc: 0-No pain                 Louie Boston

## 2023-04-12 NOTE — ED Provider Notes (Signed)
 [facility] Provider Note    Event Date/Time   First MD Initiated Contact with Patient 04/12/23 [phone]     (approximate)   History   Hematemesis   HPI  Robert Sellers is a 43 y.o. male with a history of substance abuse, alcohol abuse, varices presents with hematemesis.  He reports this started at 11 PM last night and had multiple episodes.  He does have dried blood on his clothes and on his feet.  Review of medical records demonstrates EGD performed on September 30, 2022 that demonstrated grade 1 varices     Physical Exam   Triage Vital Signs: ED Triage Vitals  Encounter Vitals Group     BP 04/12/23 0930 115/60     Systolic BP Percentile --      Diastolic BP Percentile --      Pulse Rate 04/12/23 0930 67     Resp 04/12/23 0930 15     Temp 04/12/23 0936 97.6 F (36.4 C)     Temp Source 04/12/23 0936 Oral     SpO2 04/12/23 0930 100 %     Weight 04/12/23 0937 86.2 kg (190 lb)     Height 04/12/23 0937 1.778 m (5\' 10" )     Head Circumference --      Peak Flow --      Pain Score 04/12/23 0937 10     Pain Loc --      Pain Education --      Exclude from Growth Chart --     Most recent vital signs: Vitals:   04/12/23 0930 04/12/23 0936  BP: 115/60 (!) 104/49  Pulse: 67 77  Resp: 15 18  Temp:  97.6 F (36.4 C)  SpO2: 100% 97%     General: Awake, no distress.  CV:  Good peripheral perfusion.  Normal rate Resp:  Normal effort.  Clear to auscultation Abd:  No distention.  Soft nontender Other:  Patient has tenderness to palpation of the left wrist, right wrist, right elbow and right shoulder, he thinks that he fell after getting dizzy from all of the vomiting.   ED Results / Procedures / Treatments   Labs (all labs ordered are listed, but only abnormal results are displayed) Labs Reviewed  CBC - Abnormal; Notable for the following components:      Result Value   RBC 2.18 (*)    Hemoglobin 7.0 (*)    HCT 21.6 (*)    RDW 18.1 (*)     Platelets 81 (*)    All other components within normal limits  COMPREHENSIVE METABOLIC PANEL - Abnormal; Notable for the following components:   Glucose, Bld 174 (*)    Calcium 7.1 (*)    Total Protein 4.9 (*)    Albumin 1.9 (*)    AST 127 (*)    Total Bilirubin 4.7 (*)    All other components within normal limits  PROTIME-INR - Abnormal; Notable for the following components:   Prothrombin Time 20.9 (*)    INR 1.8 (*)    All other components within normal limits  APTT  ETHANOL  SAMPLE TO BLOOD BANK  TYPE AND SCREEN  PREPARE RBC (CROSSMATCH)     EKG  ED ECG REPORT I, Jene Every, the attending physician, personally viewed and interpreted this ECG.  Date: 04/12/2023  Rhythm: normal sinus rhythm QRS Axis: normal Intervals: Abnormal ST/T Wave abnormalities: Nonspecific changes Narrative Interpretation: no evidence of acute ischemia    RADIOLOGY  PROCEDURES:  Critical Care performed: yes  CRITICAL CARE Performed by: Jene Every  Total critical care time: 30 minutes  Critical care time was exclusive of separately billable procedures and treating other patients.  Critical care was necessary to treat or prevent imminent or life-threatening deterioration.  Critical care was time spent personally by me on the following activities: development of treatment plan with patient and/or surrogate as well as nursing, discussions with consultants, evaluation of patient's response to treatment, examination of patient, obtaining history from patient or surrogate, ordering and performing treatments and interventions, ordering and review of laboratory studies, ordering and review of radiographic studies, pulse oximetry and re-evaluation of patient's condition.   Procedures   MEDICATIONS ORDERED IN ED: Medications  octreotide (SANDOSTATIN) 2 mcg/mL load via infusion 50 mcg (50 mcg Intravenous Bolus from Bag 04/12/23 1107)    And  octreotide (SANDOSTATIN) 500 mcg in  sodium chloride 0.9 % 250 mL (2 mcg/mL) infusion (50 mcg/hr Intravenous New Bag/Given 04/12/23 1105)  0.9 %  sodium chloride infusion (0 mL/hr Intravenous Hold 04/12/23 1111)  pantoprazole (PROTONIX) injection 40 mg (40 mg Intravenous Given 04/12/23 0947)  ondansetron (ZOFRAN) injection 4 mg (4 mg Intravenous Given 04/12/23 0947)  morphine (PF) 4 MG/ML injection 4 mg (4 mg Intravenous Given 04/12/23 0954)  0.9 %  sodium chloride infusion ( Intravenous New Bag/Given 04/12/23 1017)     IMPRESSION / MDM / ASSESSMENT AND PLAN / ED COURSE  I reviewed the triage vital signs and the nursing notes. Patient's presentation is most consistent with acute presentation with potential threat to life or bodily function.  Patient presents with hematemesis.  Differential includes esophageal variceal bleeding, peptic ulcer disease, gastritis, Mallory-Weiss tear  Given his history of esophageal varices will start octreotide, IV Protonix, IV fluids, IV nausea medication, will obtain labs to monitor carefully ----------------------------------------- 10:19 AM on 04/12/2023 -----------------------------------------  Lab work is overall reassuring thus far, pending CBC  Notified of slightly low blood pressure, will start IV fluids.  ----------------------------------------- 10:56 AM on 04/12/2023 ----------------------------------------- CBC has resulted, hemoglobin 7, have ordered packed red blood cells, patient has given consent for blood transfusion  Will consult the hospitalist for admission.        FINAL CLINICAL IMPRESSION(S) / ED DIAGNOSES   Final diagnoses:  Hematemesis with nausea     Rx / DC Orders   ED Discharge Orders     None        Note:  This document was prepared using Dragon voice recognition software and may include unintentional dictation errors.   Jene Every, MD 04/12/23 1121

## 2023-04-13 ENCOUNTER — Encounter: Payer: Self-pay | Admitting: Gastroenterology

## 2023-04-13 DIAGNOSIS — F1010 Alcohol abuse, uncomplicated: Secondary | ICD-10-CM | POA: Diagnosis not present

## 2023-04-13 DIAGNOSIS — D62 Acute posthemorrhagic anemia: Secondary | ICD-10-CM

## 2023-04-13 DIAGNOSIS — M25531 Pain in right wrist: Secondary | ICD-10-CM

## 2023-04-13 DIAGNOSIS — R7989 Other specified abnormal findings of blood chemistry: Secondary | ICD-10-CM

## 2023-04-13 DIAGNOSIS — K7030 Alcoholic cirrhosis of liver without ascites: Secondary | ICD-10-CM | POA: Diagnosis not present

## 2023-04-13 DIAGNOSIS — I8510 Secondary esophageal varices without bleeding: Secondary | ICD-10-CM

## 2023-04-13 DIAGNOSIS — M25532 Pain in left wrist: Secondary | ICD-10-CM

## 2023-04-13 DIAGNOSIS — D696 Thrombocytopenia, unspecified: Secondary | ICD-10-CM

## 2023-04-13 DIAGNOSIS — K7031 Alcoholic cirrhosis of liver with ascites: Secondary | ICD-10-CM

## 2023-04-13 DIAGNOSIS — E871 Hypo-osmolality and hyponatremia: Secondary | ICD-10-CM | POA: Insufficient documentation

## 2023-04-13 DIAGNOSIS — M25539 Pain in unspecified wrist: Secondary | ICD-10-CM | POA: Insufficient documentation

## 2023-04-13 DIAGNOSIS — E663 Overweight: Secondary | ICD-10-CM | POA: Insufficient documentation

## 2023-04-13 DIAGNOSIS — M25511 Pain in right shoulder: Secondary | ICD-10-CM | POA: Insufficient documentation

## 2023-04-13 LAB — BASIC METABOLIC PANEL
Anion gap: 8 (ref 5–15)
BUN: 24 mg/dL — ABNORMAL HIGH (ref 6–20)
CO2: 27 mmol/L (ref 22–32)
Calcium: 7.2 mg/dL — ABNORMAL LOW (ref 8.9–10.3)
Chloride: 99 mmol/L (ref 98–111)
Creatinine, Ser: 0.79 mg/dL (ref 0.61–1.24)
GFR, Estimated: >60 mL/min (ref >60)
Glucose, Bld: 151 mg/dL — ABNORMAL HIGH (ref 70–99)
Potassium: 4.6 mmol/L (ref 3.5–5.1)
Sodium: 134 mmol/L — ABNORMAL LOW (ref 135–145)

## 2023-04-13 LAB — CBC
HCT: 21.5 % — ABNORMAL LOW (ref 39.0–52.0)
Hemoglobin: 7.4 g/dL — ABNORMAL LOW (ref 13.0–17.0)
MCH: 31.9 pg (ref 26.0–34.0)
MCHC: 34.4 g/dL (ref 30.0–36.0)
MCV: 92.7 fL (ref 80.0–100.0)
Platelets: 50 10*3/uL — ABNORMAL LOW (ref 150–400)
RBC: 2.32 MIL/uL — ABNORMAL LOW (ref 4.22–5.81)
RDW: 19 % — ABNORMAL HIGH (ref 11.5–15.5)
WBC: 14.4 10*3/uL — ABNORMAL HIGH (ref 4.0–10.5)
nRBC: 0 % (ref 0.0–0.2)

## 2023-04-13 LAB — HEPATIC FUNCTION PANEL
ALT: 60 U/L — ABNORMAL HIGH (ref 0–44)
AST: 180 U/L — ABNORMAL HIGH (ref 15–41)
Albumin: 2.2 g/dL — ABNORMAL LOW (ref 3.5–5.0)
Alkaline Phosphatase: 70 U/L (ref 38–126)
Bilirubin, Direct: 1.8 mg/dL — ABNORMAL HIGH (ref 0.0–0.2)
Indirect Bilirubin: 2.2 mg/dL — ABNORMAL HIGH (ref 0.3–0.9)
Total Bilirubin: 4 mg/dL — ABNORMAL HIGH (ref 0.0–1.2)
Total Protein: 5.4 g/dL — ABNORMAL LOW (ref 6.5–8.1)

## 2023-04-13 MED ORDER — MORPHINE SULFATE (PF) 2 MG/ML IV SOLN
4.0000 mg | Freq: Once | INTRAVENOUS | Status: AC
  Administered 2023-04-13: 4 mg via INTRAVENOUS
  Filled 2023-04-13: qty 2

## 2023-04-13 MED ORDER — GUAIFENESIN 100 MG/5ML PO LIQD: 5.0000 mL | ORAL | Status: DC | PRN

## 2023-04-13 MED ORDER — PHYTONADIONE 5 MG PO TABS
10.0000 mg | ORAL_TABLET | Freq: Once | ORAL | Status: AC
  Administered 2023-04-13: 10 mg via ORAL
  Filled 2023-04-13: qty 2

## 2023-04-13 MED ORDER — POTASSIUM CHLORIDE 10 MEQ/100ML IV SOLN
10.0000 meq | INTRAVENOUS | Status: AC
  Filled 2023-04-13 (×2): qty 100

## 2023-04-13 MED ORDER — SODIUM CHLORIDE 0.9 % IV SOLN
12.5000 mg | Freq: Four times a day (QID) | INTRAVENOUS | Status: DC | PRN
  Filled 2023-04-13: qty 0.5

## 2023-04-13 MED ORDER — BENZONATATE 100 MG PO CAPS
100.0000 mg | ORAL_CAPSULE | Freq: Three times a day (TID) | ORAL | Status: DC
  Administered 2023-04-14 – 2023-04-16 (×7): 100 mg via ORAL
  Filled 2023-04-13 (×7): qty 1

## 2023-04-13 MED ORDER — SODIUM CHLORIDE 0.9 % IV SOLN
1.0000 g | INTRAVENOUS | Status: DC
  Administered 2023-04-13 – 2023-04-15 (×3): 1 g via INTRAVENOUS
  Filled 2023-04-13 (×4): qty 10

## 2023-04-13 MED ORDER — MORPHINE SULFATE (PF) 2 MG/ML IV SOLN
INTRAVENOUS | Status: AC
  Filled 2023-04-13: qty 1

## 2023-04-13 MED ORDER — METHYLPREDNISOLONE SODIUM SUCC 125 MG IJ SOLR
40.0000 mg | Freq: Once | INTRAMUSCULAR | Status: AC
  Administered 2023-04-13: 40 mg via INTRAVENOUS
  Filled 2023-04-13: qty 2

## 2023-04-13 NOTE — ED Notes (Addendum)
 Pt assisted to bed side to use urinal and move to bedside chair per pt request

## 2023-04-13 NOTE — Assessment & Plan Note (Addendum)
 Platelet count 33.  Continue to monitor as outpatient.

## 2023-04-13 NOTE — Assessment & Plan Note (Signed)
 Sodium 133 (2 points less than normal range).

## 2023-04-13 NOTE — Progress Notes (Signed)
 Progress Note   Patient: Robert Sellers UEA:[id number] DOB: October 30, 1980 DOA: 04/12/2023     1 DOS: the patient was seen and examined on 04/13/2023   Brief hospital course: 43 y.o. male with an extensive history of alcohol abuse who came in with hematemesis.  The patient has a history of upper GI bleeding in the past and had an upper endoscopy by Dr. Norma Fredrickson in August 2024 with grade 1 esophageal varices with no intervention done at that time.  He reports that he drinks approximately a pint of whiskey or vodka a day but his wife states that it is been much more than that. Pt confirms has been vomiting up blood few days now.    Hospital course:  In ED, Hgb 7.0. Started on protonix and octreotide. 2 units PRBC. Admitted to hospitalist service. GI performed EGD - Grade III varices were found in the lower third of the esophagus. Five bands were successfully placed with incomplete eradication of varices. Continue octreotide 72h. If further bleeding will need TIPS.   3/12.  Hemoglobin 7.4.  No bowel movement since coming in.  Had esophageal banding yesterday.  When I walked in the patient was not on octreotide drip.  Spoke with pharmacist and nursing staff to get octreotide drip going.  Patient complains of right arm and bilateral wrist pain.  Patient had a fall off the toilet prior to coming in.    Assessment and Plan: * Acute blood loss anemia Patient given 1 unit of packed red blood cells.  Has not had a bowel movement since coming in.  Continue to monitor.  Last hemoglobin 7.4.  May end up needing a another transfusion if hemoglobin drops lower.  Secondary esophageal varices without bleeding (HCC) Patient had EGD and banding of esophageal varices.  Spoke with nursing staff and pharmacist to get octreotide drip running this morning.  Continue Protonix.  Empiric Rocephin.  Alcoholic cirrhosis of liver without ascites (HCC) Seen on CT scan back in 2024.  Patient has thrombocytopenia, elevated liver  function test.  Elevation in INR.  Alcohol abuse Patient on phenobarbital alcohol withdrawal protocol  Hematemesis with nausea No further vomiting  Thrombocytopenia (HCC) Platelet count 50.  Continue to monitor.  Elevated liver function tests AST 188, ALT 60.  Total bilirubin 4.0 continue to monitor.  Acute pain of right shoulder At deltoid insertion.  Will give 1 dose of Solu-Medrol to see if it improves.  PT and OT evaluations.  Wrist pain Bilateral.  X-rays negative  Overweight (BMI 25.0-29.9) BMI 27.27 with current height and weight in computer  Hyponatremia Sodium 1.7 with normal range        Subjective: Patient has not had a bowel movement since coming in.  Prior to coming in bowel movements were brown.  Patient had a fall from the toilet and has bilateral wrist pain and right shoulder pain.  Had esophageal variceal banding yesterday.  Physical Exam: Vitals:   04/13/23 0800 04/13/23 0833 04/13/23 0950 04/13/23 1100  BP: 139/85 128/84  136/83  Pulse: (!) 101 88  84  Resp:  16  18  Temp:   98.3 F (36.8 C)   TempSrc:   Oral   SpO2: 93% 96%  98%  Weight:      Height:       Physical Exam HENT:     Head: Normocephalic.     Mouth/Throat:     Pharynx: No oropharyngeal exudate.  Eyes:     General: Lids are normal.  Comments: Conjunctiva icteric  Cardiovascular:     Rate and Rhythm: Normal rate and regular rhythm.     Heart sounds: Normal heart sounds, S1 normal and S2 normal.  Pulmonary:     Breath sounds: No decreased breath sounds, wheezing, rhonchi or rales.  Abdominal:     Palpations: Abdomen is soft.     Tenderness: There is no abdominal tenderness.  Musculoskeletal:     Comments: Pain to palpation over deltoid insertion lateral arm.  Patient hardly able to move his arm today.  Bilateral wrist pain but able to move wrist.  Skin:    General: Skin is warm.     Findings: No rash.  Neurological:     Mental Status: He is alert and oriented to  person, place, and time.     Data Reviewed: Sodium 134, creatinine 0.79, AST 180, ALT 60, total bilirubin 4.0, white blood count 14.4, hemoglobin 7.4, platelet count 50 Family Communication: Family at bedside  Disposition: Status is: Inpatient Remains inpatient appropriate because: Restarting octreotide drip  Planned Discharge Destination: Home    Time spent: 28 minutes  Author: Alford Highland, MD 04/13/2023 11:50 AM  For on call review [email or web address].

## 2023-04-13 NOTE — Hospital Course (Addendum)
 43 y.o. male with an extensive history of alcohol abuse who came in with hematemesis.  The patient has a history of upper GI bleeding in the past and had an upper endoscopy by Dr. Norma Fredrickson in August 2024 with grade 1 esophageal varices with no intervention done at that time.  He reports that he drinks approximately a pint of whiskey or vodka a day but his wife states that it is been much more than that. Pt confirms has been vomiting up blood few days now.    Hospital course:  In ED, Hgb 7.0. Started on protonix and octreotide. 2 units PRBC. Admitted to hospitalist service. GI performed EGD - Grade III varices were found in the lower third of the esophagus. Five bands were successfully placed with incomplete eradication of varices. Continue octreotide 72h. If further bleeding will need TIPS.   3/12.  Hemoglobin 7.4.  No bowel movement since coming in.  Had esophageal banding yesterday.  When I walked in the patient was not on octreotide drip.  Spoke with pharmacist and nursing staff to get octreotide drip going.  Patient complains of right arm and bilateral wrist pain.  Patient had a fall off the toilet prior to coming in. 3/13.  Hemoglobin 6.9 will give 1 unit of packed red blood cells.  No signs of bleeding.  Right shoulder feeling better than yesterday. 3/14.  Hemoglobin still 6.9.  Ordered for another unit of packed red blood cells.  No bowel movement since admission.  MiraLAX given.  Will give a dose of lactulose. 3/15.  Hemoglobin 8.2 this morning.  Patient stated he had numerous bowel movements but did not see any blood.  Patient states he has to get out of here today.

## 2023-04-13 NOTE — ED Notes (Signed)
 Medication delivery of Phenobarbital confirmed by pharmacy. Will give when medication received.

## 2023-04-13 NOTE — Evaluation (Signed)
 Physical Therapy Evaluation Patient Details Name: Robert Sellers MRN: [id number] DOB: [birth year] Today's Date: 04/13/2023  History of Present Illness  Pt is a 43 y.o. 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 PMH: alcohol abuse, alcoholic cirrhosis, upper GI bleed.   Clinical Impression  Pt alert, oriented x4, reported significant R shoulder and wrist pain. Pt stated at baseline he is independent, works full time, denies any other falls. Upon assessment pt with full ROM of shoulder and bilateral wrist, just challenged by pain. TTP of R deltoid insertion on humerus. Educated on ROM exercises and HEP to encourage movement/pain management. Supine <> sit supervision with extra time, verbal cues. Sit <> Stand CGA, and pt able to ambulate to and from the bathroom without AD. Tremulous, very shuffled step and pt fatigued very quickly. spO2 88-93% on room air, [place] donned at end of session (was in place upon PT arrival) RN notified of pt oxygen status. HR in 120s s/p ambulation as well.  Overall the patient demonstrated deficits (see "PT Problem List") that impede the patient's functional abilities, safety, and mobility and would benefit from skilled PT intervention.          If plan is discharge home, recommend the following: A lot of help with walking and/or transfers;A lot of help with bathing/dressing/bathroom;Assistance with cooking/housework;Assist for transportation;Help with stairs or ramp for entrance   Can travel by private vehicle   No    Equipment Recommendations Other (comment) (TBD)  Recommendations for Other Services       Functional Status Assessment Patient has had a recent decline in their functional status and demonstrates the ability to make significant improvements in function in a reasonable and predictable amount of time.      Precautions / Restrictions Precautions Precautions: Fall Recall of Precautions/Restrictions: Intact Restrictions Weight Bearing Restrictions Per Provider Order: No      Mobility  Bed Mobility Overal bed mobility: Needs Assistance Bed Mobility: Supine to Sit, Sit to Supine     Supine to sit: Supervision, HOB elevated Sit to supine: Supervision        Transfers Overall transfer level: Needs assistance   Transfers: Sit to/from Stand Sit to Stand: Contact guard assist                Ambulation/Gait Ambulation/Gait assistance: Contact guard assist Gait Distance (Feet):  (44ft to bathroom, seated rest break, 69ft) Assistive device: None         General Gait Details: tremulous, very shuffled steps, fatigued very quickly  Stairs            Wheelchair Mobility     Tilt Bed    Modified Rankin (Stroke Patients Only)       Balance Overall balance assessment: Needs assistance Sitting-balance support: Feet supported Sitting balance-Leahy Scale: Good       Standing balance-Leahy Scale: Fair                               Pertinent Vitals/Pain Pain Assessment Pain Assessment: Faces Faces Pain Scale: Hurts even more Pain Location: R shoulder, bilateral wrists Pain Descriptors / Indicators: Guarding, Grimacing, Aching Pain Intervention(s): Limited activity within patient's tolerance, Monitored during session, Repositioned    Home Living Family/patient expects to be discharged to:: Private residence Living Arrangements:  Spouse/significant other Available Help at Discharge: Family Type of Home: Apartment Home Access: Stairs to enter Entrance Stairs-Rails: Left Entrance Stairs-Number of Steps: 11   Home Layout: One level Home Equipment: Grab bars - tub/shower 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 Prior Level of Function : Independent/Modified Independent;Working/employed;Driving              Mobility Comments: IND ADLs Comments: IND     Extremity/Trunk Assessment   Upper Extremity Assessment Upper Extremity Assessment: Generalized weakness;Defer to OT evaluation (difficulty with R shoulder ROM, TTP on deltoid insertion, humerus)    Lower Extremity Assessment Lower Extremity Assessment: Generalized weakness       Communication        Cognition Arousal: Alert     PT - Cognitive impairments: No apparent impairments                         Following commands: Intact       Cueing Cueing Techniques: Verbal cues, Gestural cues     General Comments      Exercises     Assessment/Plan    PT Assessment Patient needs continued PT services  PT Problem List Decreased strength;Decreased activity tolerance;Decreased balance;Decreased range of motion;Decreased mobility;Decreased knowledge of precautions;Pain;Decreased knowledge of use of DME       PT Treatment Interventions Balance training;DME instruction;Gait training;Neuromuscular re-education;Stair training;Functional mobility training;Patient/family education;Therapeutic activities;Therapeutic exercise    PT Goals (Current goals can be found in the Care Plan section)  Acute Rehab PT Goals Patient Stated Goal: to feel better PT Goal Formulation: With patient Time For Goal Achievement: 04/27/23 Potential to Achieve Goals: Good    Frequency Min 1X/week     Co-evaluation               AM-PAC PT "6 Clicks" Mobility  Outcome Measure Help needed turning from your back to your side while in a flat bed without using bedrails?: A Little Help needed moving from lying on your back to sitting on the side of a flat bed without using bedrails?: A Little Help needed moving to and from a bed to a chair (including a wheelchair)?: A Little Help needed standing up from a chair using your arms (e.g., wheelchair or bedside chair)?: A Little Help needed to walk in hospital room?: A Little Help needed  climbing 3-5 steps with a railing? : A Lot 6 Click Score: 17    End of Session   Activity Tolerance: Patient limited by fatigue Patient left: in bed;with call bell/phone within reach Nurse Communication: Mobility status PT Visit Diagnosis: Other abnormalities of gait and mobility (R26.89);Difficulty in walking, not elsewhere classified (R26.2);Muscle weakness (generalized) (M62.81);Pain Pain - Right/Left: Right Pain - part of body: Shoulder    Time: 7829-5621 PT Time Calculation (min) (ACUTE ONLY): 25 min   Charges:   PT Evaluation $PT Eval Low Complexity: 1 Low PT Treatments $Therapeutic Exercise: 8-22 mins $Therapeutic Activity: 8-22 mins PT General Charges $$ ACUTE PT VISIT: 1 Visit        Olga Coaster PT, DPT 4:06 PM,04/13/23

## 2023-04-13 NOTE — Assessment & Plan Note (Addendum)
 Patient received phenobarbital and Ativan withdrawal protocol while here.  No signs of withdrawal upon discharge.

## 2023-04-13 NOTE — Assessment & Plan Note (Addendum)
 At deltoid insertion.  Patient received Solu-Medrol here.  Will switch over to prednisone taper upon going home.

## 2023-04-13 NOTE — Assessment & Plan Note (Addendum)
 Patient had EGD and banding of esophageal varices.  Octreotide drip stopped today.  Protonix switched to oral twice a day.  Rocephin switched to Cipro to complete 1 week course.  Continue soft diet.  Low-dose nadolol.

## 2023-04-13 NOTE — Assessment & Plan Note (Signed)
No further vomiting

## 2023-04-13 NOTE — Progress Notes (Signed)
 [facility] Inpatient Progress Note    Subjective: Patient seen for f/u bleeding esophageal varices s/p EGD with variceal band ligation x 5. Patient in NAD. Would like to advance diet if possible. Denies pain, hemetemesis, nausea, vomiting. Alert and answers questions appropriately.  Objective: Vital signs in last 24 hours: Temp:  [97.9 F (36.6 C)-98.3 F (36.8 C)] 98.2 F (36.8 C) (03/12 1823) Pulse Rate:  [76-113] 92 (03/12 1925) Resp:  [13-19] 14 (03/12 1900) BP: (121-140)/(65-85) 123/71 (03/12 1925) SpO2:  [93 %-98 %] 96 % (03/12 1900) Blood pressure 123/71, pulse 92, temperature 98.2 F (36.8 C), temperature source Oral, resp. rate 14, height 5\' 10"  (1.778 m), weight 86.2 kg, SpO2 96%.    Intake/Output from previous day: 03/11 0701 - 03/12 0700 In: 2287.1 [I.V.:1487.1; Blood:700; IV Piggyback:100] Out: -   Intake/Output this shift: No intake/output data recorded.   Gen: NAD. Appears comfortable.  HEENT: [place]/AT. PERRLA. Normal external ear exam.  Chest: CTA, no wheezes.  CV: RR nl S1, S2. No gallops.  Abd: soft, nt, nd. BS+  Ext: no edema. Pulses 2+  Neuro: Alert and oriented. Judgement appears normal. Nonfocal.   Lab Results: Results for orders placed or performed during the hospital encounter of 04/12/23 (from the past 24 hours)  CBC     Status: Abnormal   Collection Time: 04/13/23  8:51 AM  Result Value Ref Range   WBC 14.4 (H) 4.0 - 10.5 K/uL   RBC 2.32 (L) 4.22 - 5.81 MIL/uL   Hemoglobin 7.4 (L) 13.0 - 17.0 g/dL   HCT 16.1 (L) 09.6 - 04.5 %   MCV 92.7 80.0 - 100.0 fL   MCH 31.9 26.0 - 34.0 pg   MCHC 34.4 30.0 - 36.0 g/dL   RDW 40.9 (H) 81.1 - 91.4 %   Platelets 50 (L) 150 - 400 K/uL   nRBC 0.0 0.0 - 0.2 %  Basic metabolic panel     Status: Abnormal   Collection Time: 04/13/23  8:51 AM  Result Value Ref Range   Sodium 134 (L) 135 - 145 mmol/L   Potassium 4.6 3.5 - 5.1 mmol/L   Chloride 99 98 - 111 mmol/L   CO2 27 22 - 32  mmol/L   Glucose, Bld 151 (H) 70 - 99 mg/dL   BUN 24 (H) 6 - 20 mg/dL   Creatinine, Ser 7.82 0.61 - 1.24 mg/dL   Calcium 7.2 (L) 8.9 - 10.3 mg/dL   GFR, Estimated >95 >62 mL/min   Anion gap 8 5 - 15  Hepatic function panel     Status: Abnormal   Collection Time: 04/13/23  8:51 AM  Result Value Ref Range   Total Protein 5.4 (L) 6.5 - 8.1 g/dL   Albumin 2.2 (L) 3.5 - 5.0 g/dL   AST 130 (H) 15 - 41 U/L   ALT 60 (H) 0 - 44 U/L   Alkaline Phosphatase 70 38 - 126 U/L   Total Bilirubin 4.0 (H) 0.0 - 1.2 mg/dL   Bilirubin, Direct 1.8 (H) 0.0 - 0.2 mg/dL   Indirect Bilirubin 2.2 (H) 0.3 - 0.9 mg/dL     Recent Labs    86/57/84 0935 04/12/23 1800 04/13/23 0851  WBC 8.4  --  14.4*  HGB 7.0* 8.2* 7.4*  HCT 21.6* 24.6* 21.5*  PLT 81*  --  50*   BMET Recent Labs    04/12/23 0935 04/13/23 0851  NA 137 134*  K 4.9 4.6  CL 102 99  CO2 22  27  GLUCOSE 174* 151*  BUN 18 24*  CREATININE 0.73 0.79  CALCIUM 7.1* 7.2*   LFT Recent Labs    04/13/23 0851  PROT 5.4*  ALBUMIN 2.2*  AST 180*  ALT 60*  ALKPHOS 70  BILITOT 4.0*  BILIDIR 1.8*  IBILI 2.2*   PT/INR Recent Labs    04/12/23 0935  LABPROT 20.9*  INR 1.8*   Hepatitis Panel No results for input(s): "HEPBSAG", "HCVAB", "HEPAIGM", "HEPBIGM" in the last 72 hours. C-Diff No results for input(s): "CDIFFTOX" in the last 72 hours. No results for input(s): "CDIFFPCR" in the last 72 hours.   Studies/Results: DG Elbow Complete Right Result Date: 04/12/2023 CLINICAL DATA:  Pain after fall. EXAM: RIGHT ELBOW - COMPLETE 3+ VIEW COMPARISON:  None Available. FINDINGS: There is no evidence of fracture, dislocation, or joint effusion. The alignment and joint spaces are preserved. There is no evidence of arthropathy or other focal bone abnormality. Soft tissues are unremarkable. IMPRESSION: Negative radiographs of the right elbow. Electronically Signed   By: Narda Rutherford M.D.   On: 04/12/2023 12:39   DG Wrist Complete  Right Result Date: 04/12/2023 CLINICAL DATA:  Pain after fall. EXAM: RIGHT WRIST - COMPLETE 3+ VIEW COMPARISON:  None Available. FINDINGS: There is no evidence of fracture or dislocation. Alignment and joint spaces are preserved. Healed fifth metacarpal fracture appears remote. Minor degenerative spurring. Soft tissues are unremarkable. IMPRESSION: 1. No acute fracture or subluxation of the right wrist. 2. Healed fifth metacarpal fracture. Electronically Signed   By: Narda Rutherford M.D.   On: 04/12/2023 12:38   DG Wrist Complete Left Result Date: 04/12/2023 CLINICAL DATA:  Pain after fall. EXAM: LEFT WRIST - COMPLETE 3+ VIEW COMPARISON:  None Available. FINDINGS: There is no evidence of fracture or dislocation. Alignment and joint spaces are preserved. Occasional degenerative spurring. Soft tissues are unremarkable. IMPRESSION: No fracture or subluxation of the left wrist. Electronically Signed   By: Narda Rutherford M.D.   On: 04/12/2023 12:38   DG Shoulder Right Portable Result Date: 04/12/2023 CLINICAL DATA:  Pain after fall. EXAM: RIGHT SHOULDER - 1 VIEW COMPARISON:  None Available. FINDINGS: There is no evidence of fracture or dislocation. The alignment and joint spaces are preserved. There is no evidence of arthropathy or other focal bone abnormality. Soft tissues are unremarkable. IMPRESSION: Negative radiographs of the right shoulder. Electronically Signed   By: Narda Rutherford M.D.   On: 04/12/2023 12:37    Scheduled Inpatient Medications:    benzonatate  100 mg Oral TID   folic acid  1 mg Oral Daily   multivitamin with minerals  1 tablet Oral Daily   pantoprazole (PROTONIX) IV  40 mg Intravenous Q12H   PHENObarbital  97.5 mg Intravenous Q8H   Followed by   [START ON 04/15/2023] PHENObarbital  65 mg Intravenous Q8H   Followed by   Melene Muller ON 04/17/2023] PHENObarbital  32.5 mg Intravenous Q8H   thiamine  100 mg Oral Daily   Or   thiamine  100 mg Intravenous Daily    Continuous  Inpatient Infusions:    sodium chloride Stopped (04/12/23 1111)   cefTRIAXone (ROCEPHIN)  IV Stopped (04/13/23 1250)   octreotide (SANDOSTATIN) 500 mcg in sodium chloride 0.9 % 250 mL (2 mcg/mL) infusion 50 mcg/hr (04/13/23 1851)   promethazine (PHENERGAN) injection (IM or IVPB)      PRN Inpatient Medications:  guaiFENesin, LORazepam **OR** LORazepam, morphine injection, oxyCODONE, promethazine (PHENERGAN) injection (IM or IVPB)  Miscellaneous: N/A  Assessment:  Acute post-hemorrhagic anemia secondary to bleeding esophageal varices. Resolved s/p EGD with EVL x 5.  Portal venous hypertension. Alcoholic cirrhosis  MELD score = 18. Thrombocytopenia secondary to #3. Hx hyponatremia - Na now normal at 137.  Plan:  Continue octreotide for total of 72 hrs. Advance diet. Repeat EGD with further variceal eradication in 2-3 weeks. Serial H/H. ETOH rehab efforts.  6.   Following. Dr. Timothy Lasso (GI) to see tomorrow.  Ramatoulaye Pack K. Norma Fredrickson, M.D. 04/13/2023, 8:21 PM

## 2023-04-13 NOTE — Evaluation (Signed)
 Occupational Therapy Evaluation Patient Details Name: Robert Sellers MRN: [id number] DOB: 02/10/1980 Today's Date: 04/13/2023   History of Present Illness   Pt is a 43 y.o. 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 PMH: alcohol abuse, alcoholic cirrhosis, upper GI bleed.     Clinical Impressions Pt was seen for OT evaluation this date. PTA, pt was living at home with his wife. Working full time, IND with all tasks with no AD use. Pt presents to acute OT demonstrating impaired ADL performance and functional mobility 2/2 weakness, pain, balance deficits and low activity tolerance. Pt currently requires SUP for bed mobility and CGA for STS and lateral steps to [facility]. Pt declining further mobility d/t fatigue after recently walking with PT. Minimal dizziness with change in positions with BP stable. RUE pain with ROM with difficulty with RUE shoulder ROM. Coordination intact, pain is most limited factor. Provided written HEP for RUE shoulder, elbow and wrist ROM and L wrist ROM with pt verbalizing understanding. Needs SUP and increased time to doff/don socks seated at EOB. Pain 8/10-nurse notified for pain meds. Pt would benefit from skilled OT services to address noted impairments and functional limitations (see below for any additional details) in order to maximize safety and independence while minimizing falls risk and caregiver burden. Do anticipate the need for follow up OT services upon acute hospital DC.      If plan is discharge home, recommend the following:   A little help with walking and/or transfers;A little help with bathing/dressing/bathroom;Help with stairs or ramp for entrance;Assist for transportation;Assistance with cooking/housework     Functional Status Assessment   Patient has had a recent decline in their functional status and  demonstrates the ability to make significant improvements in function in a reasonable and predictable amount of time.     Equipment Recommendations   Other (comment);Tub/shower seat (RW/ defer)     Recommendations for Other Services         Precautions/Restrictions   Precautions Precautions: Fall Recall of Precautions/Restrictions: Intact Restrictions Weight Bearing Restrictions Per Provider Order: No     Mobility Bed Mobility Overal bed mobility: Needs Assistance Bed Mobility: Supine to Sit, Sit to Supine     Supine to sit: Supervision, HOB elevated Sit to supine: Supervision   General bed mobility comments: increased effort d/t pain    Transfers Overall transfer level: Needs assistance   Transfers: Sit to/from Stand Sit to Stand: Contact guard assist           General transfer comment: CGA for STS and lateral steps to [facility] with minimal shakiness likely starting DTs      Balance Overall balance assessment: Needs assistance Sitting-balance support: Feet supported Sitting balance-Leahy Scale: Good       Standing balance-Leahy Scale: Fair Standing balance comment: CGA for balance standing at EOB with no AD use                           ADL either performed or assessed with clinical judgement   ADL Overall ADL's : Needs assistance/impaired                     Lower Body Dressing: Supervision/safety;Sitting/lateral leans Lower Body Dressing Details (indicate cue type and reason): increased time and cueing to use both  hands to don socks seated EOB               General ADL Comments: suspect Min/CGA for ADLs with weakness and pain in BUEs     Vision         Perception         Praxis         Pertinent Vitals/Pain Pain Assessment Pain Assessment: 0-10 Pain Score: 8  Pain Location: R shoulder, bilateral wrists Pain Descriptors / Indicators: Guarding, Grimacing, Aching, Sore Pain Intervention(s): Monitored during  session, Repositioned, Patient requesting pain meds-RN notified     Extremity/Trunk Assessment Upper Extremity Assessment Upper Extremity Assessment: Generalized weakness;RUE deficits/detail;LUE deficits/detail RUE Deficits / Details: R shoulder ROM difficult d/t pain, R wrist also painful and mild edema; L wrist minimal pain RUE Coordination: WNL LUE Coordination: WNL   Lower Extremity Assessment Lower Extremity Assessment: Generalized weakness       Communication     Cognition Arousal: Lethargic, Alert Behavior During Therapy: WFL for tasks assessed/performed               OT - Cognition Comments: closed eyes multiple times during session d/t lethargy, but able to follow commands                 Following commands: Intact       Cueing  General Comments   Cueing Techniques: Verbal cues;Gestural cues  minimal dizziness with initial change from supine to sit at EOB BP stable   Exercises Other Exercises Other Exercises: Edu on role of OT in acute setting. Other Exercises: Edu on RUE shoulder, elbow and wrist ROM and LUE wrist ROM to reduce swelling and prevent further stiffness.   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      Prior Functioning/Environment Prior Level of Function : Independent/Modified Independent;Working/employed;Driving             Mobility Comments: IND ADLs Comments: IND    OT Problem List: Decreased strength;Pain;Impaired balance (sitting and/or standing)   OT  Treatment/Interventions: Self-care/ADL training;Therapeutic exercise;Therapeutic activities;Patient/family education;Balance training      OT Goals(Current goals can be found in the care plan section)   Acute Rehab OT Goals Patient Stated Goal: improve pain OT Goal Formulation: With patient Time For Goal Achievement: 04/27/23 Potential to Achieve Goals: Fair ADL Goals Pt Will Perform Lower Body Bathing: with supervision;sitting/lateral leans;sit to/from stand Pt Will Perform Lower Body Dressing: with supervision;sit to/from stand;sitting/lateral leans Pt Will Transfer to Toilet: with supervision;ambulating;regular height toilet Pt/caregiver will Perform Home Exercise Program: Increased strength;Increased ROM;Independently;Both right and left upper extremity;With written HEP provided   OT Frequency:  Min 1X/week    Co-evaluation              AM-PAC OT "6 Clicks" Daily Activity     Outcome Measure Help from another person eating meals?: None Help from another person taking care of personal grooming?: A Little Help from another person toileting, which includes using toliet, bedpan, or urinal?: A Little Help from another person bathing (including washing, rinsing, drying)?: A  Lot Help from another person to put on and taking off regular upper body clothing?: A Little Help from another person to put on and taking off regular lower body clothing?: A Lot 6 Click Score: 17   End of Session Equipment Utilized During Treatment: Oxygen Nurse Communication: Mobility status  Activity Tolerance: Patient tolerated treatment well;Patient limited by fatigue Patient left: in bed;with call bell/phone within reach;with bed alarm set  OT Visit Diagnosis: Other abnormalities of gait and mobility (R26.89);Pain Pain - Right/Left: Right Pain - part of body: Shoulder;Arm                Time: 1541-1557 OT Time Calculation (min): 16 min Charges:  OT General Charges $OT Visit: 1 Visit OT  Evaluation $OT Eval Moderate Complexity: 1 Mod Gorden Stthomas, OTR/L 04/13/23, 4:19 PM  Fleet Higham E Freemon Binford 04/13/2023, 4:16 PM

## 2023-04-13 NOTE — Assessment & Plan Note (Addendum)
 Seen on CT scan back in 2024.  Patient has thrombocytopenia, elevated liver function test.  Elevation in INR.  And slightly elevated ammonia level.  Continue Xifaxan.  Given a dose of vitamin K during the hospital.  If he wants to consider a liver transplant in the future he has to stop drinking.  Refer to gastroenterology as outpatient.

## 2023-04-13 NOTE — Assessment & Plan Note (Signed)
 BMI 27.48 with current height and weight in computer

## 2023-04-13 NOTE — Assessment & Plan Note (Addendum)
 Today's hemoglobin 8.2. Patient received 3 units of packed red blood cells during the hospital course.  Patient states he needs to get out of here today.  Patient had a few bowel movements but states there was no blood.

## 2023-04-13 NOTE — Assessment & Plan Note (Signed)
 Bilateral.  X-rays negative

## 2023-04-13 NOTE — Assessment & Plan Note (Addendum)
 AST 130, ALT 51.  Total bilirubin 3.6.  Continue to monitor as outpatient.

## 2023-04-14 DIAGNOSIS — K7030 Alcoholic cirrhosis of liver without ascites: Secondary | ICD-10-CM | POA: Diagnosis not present

## 2023-04-14 DIAGNOSIS — D62 Acute posthemorrhagic anemia: Secondary | ICD-10-CM | POA: Diagnosis not present

## 2023-04-14 DIAGNOSIS — F1010 Alcohol abuse, uncomplicated: Secondary | ICD-10-CM | POA: Diagnosis not present

## 2023-04-14 DIAGNOSIS — I8510 Secondary esophageal varices without bleeding: Secondary | ICD-10-CM | POA: Diagnosis not present

## 2023-04-14 LAB — CBC
HCT: 20.2 % — ABNORMAL LOW (ref 39.0–52.0)
Hemoglobin: 6.9 g/dL — ABNORMAL LOW (ref 13.0–17.0)
MCH: 32.4 pg (ref 26.0–34.0)
MCHC: 34.2 g/dL (ref 30.0–36.0)
MCV: 94.8 fL (ref 80.0–100.0)
Platelets: 38 10*3/uL — ABNORMAL LOW (ref 150–400)
RBC: 2.13 MIL/uL — ABNORMAL LOW (ref 4.22–5.81)
RDW: 18.6 % — ABNORMAL HIGH (ref 11.5–15.5)
WBC: 10.4 10*3/uL (ref 4.0–10.5)
nRBC: 0 % (ref 0.0–0.2)

## 2023-04-14 LAB — BASIC METABOLIC PANEL
Anion gap: 6 (ref 5–15)
BUN: 26 mg/dL — ABNORMAL HIGH (ref 6–20)
CO2: 29 mmol/L (ref 22–32)
Calcium: 7.3 mg/dL — ABNORMAL LOW (ref 8.9–10.3)
Chloride: 98 mmol/L (ref 98–111)
Creatinine, Ser: 0.84 mg/dL (ref 0.61–1.24)
GFR, Estimated: >60 mL/min (ref >60)
Glucose, Bld: 130 mg/dL — ABNORMAL HIGH (ref 70–99)
Potassium: 4.4 mmol/L (ref 3.5–5.1)
Sodium: 133 mmol/L — ABNORMAL LOW (ref 135–145)

## 2023-04-14 LAB — PREPARE RBC (CROSSMATCH)

## 2023-04-14 LAB — HEMOGLOBIN AND HEMATOCRIT, BLOOD
HCT: 20.2 % — ABNORMAL LOW (ref 39.0–52.0)
Hemoglobin: 6.9 g/dL — ABNORMAL LOW (ref 13.0–17.0)

## 2023-04-14 LAB — AMMONIA: Ammonia: 46 umol/L — ABNORMAL HIGH (ref 9–35)

## 2023-04-14 MED ORDER — METHYLPREDNISOLONE SODIUM SUCC 40 MG IJ SOLR
40.0000 mg | Freq: Every day | INTRAMUSCULAR | Status: DC
  Administered 2023-04-14 – 2023-04-15 (×2): 40 mg via INTRAVENOUS
  Filled 2023-04-14 (×2): qty 1

## 2023-04-14 MED ORDER — ACETAMINOPHEN 325 MG PO TABS
650.0000 mg | ORAL_TABLET | Freq: Once | ORAL | Status: AC
  Administered 2023-04-14: 650 mg via ORAL
  Filled 2023-04-14: qty 2

## 2023-04-14 MED ORDER — SODIUM CHLORIDE 0.9% IV SOLUTION: Freq: Once | INTRAVENOUS | Status: DC

## 2023-04-14 MED ORDER — RIFAXIMIN 550 MG PO TABS
550.0000 mg | ORAL_TABLET | Freq: Two times a day (BID) | ORAL | Status: DC
Start: 2023-04-14 — End: 2023-04-16
  Administered 2023-04-14 – 2023-04-16 (×5): 550 mg via ORAL
  Filled 2023-04-14 (×5): qty 1

## 2023-04-14 NOTE — Plan of Care (Signed)
  Problem: Education: Goal: Knowledge of General Education information will improve Description Including pain rating scale, medication(s)/side effects and non-pharmacologic comfort measures Outcome: Progressing   Problem: Clinical Measurements: Goal: Ability to maintain clinical measurements within normal limits will improve Outcome: Progressing   Problem: Activity: Goal: Risk for activity intolerance will decrease Outcome: Progressing   Problem: Elimination: Goal: Will not experience complications related to bowel motility Outcome: Progressing   Problem: Skin Integrity: Goal: Risk for impaired skin integrity will decrease Outcome: Progressing   

## 2023-04-14 NOTE — Progress Notes (Signed)
 Inpatient Follow-up/Progress Note   Patient ID: Robert Sellers is a 43 y.o. male.  Overnight Events / Subjective Findings NAEON. Pt resting in bed comfortably.  Tolerating p.o.  No further signs of bleeding.  The patient denies any further bowel movements. He is receiving 1 unit of blood after small drop in hemoglobin to 6.9.  He denies any abdominal pain or distention. He is aware that he has to quit drinking alcohol.  Review of Systems  Constitutional:  Negative for activity change, appetite change, chills, diaphoresis, fatigue, fever and unexpected weight change.  HENT:  Negative for trouble swallowing and voice change.   Respiratory:  Negative for shortness of breath and wheezing.   Cardiovascular:  Negative for chest pain, palpitations and leg swelling.  Gastrointestinal:  Negative for abdominal distention, abdominal pain, anal bleeding, blood in stool, constipation, diarrhea, nausea and vomiting.  Musculoskeletal:  Positive for arthralgias. Negative for myalgias.  Skin:  Negative for color change and pallor.  Neurological:  Negative for dizziness, syncope and weakness.  Psychiatric/Behavioral:  Negative for confusion. The patient is not nervous/anxious.   All other systems reviewed and are negative.    Medications  Current Facility-Administered Medications:    0.9 %  sodium chloride infusion (Manually program via Guardrails IV Fluids), , Intravenous, Once, Wieting, Richard, MD   0.9 %  sodium chloride infusion, 10 mL/hr, Intravenous, Once, Jene Every, MD, Held at 04/12/23 1111   benzonatate (TESSALON) capsule 100 mg, 100 mg, Oral, TID, Renae Gloss, Richard, MD, 100 mg at 04/14/23 0915   cefTRIAXone (ROCEPHIN) 1 g in sodium chloride 0.9 % 100 mL IVPB, 1 g, Intravenous, Q24H, Wieting, Richard, MD, Stopped at 04/13/23 1250   folic acid (FOLVITE) tablet 1 mg, 1 mg, Oral, Daily, Sunnie Nielsen, DO, 1 mg at 04/14/23 1610   guaiFENesin (ROBITUSSIN) 100 MG/5ML liquid 5 mL, 5 mL,  Oral, Q4H PRN, Renae Gloss, Richard, MD   LORazepam (ATIVAN) tablet 1-4 mg, 1-4 mg, Oral, Q1H PRN, 1 mg at 04/13/23 0849 **OR** LORazepam (ATIVAN) injection 1-4 mg, 1-4 mg, Intravenous, Q1H PRN, Sunnie Nielsen, DO, 2 mg at 04/12/23 2011   methylPREDNISolone sodium succinate (SOLU-MEDROL) 40 mg/mL injection 40 mg, 40 mg, Intravenous, Daily, Renae Gloss, Richard, MD, 40 mg at 04/14/23 9604   morphine (PF) 2 MG/ML injection 2 mg, 2 mg, Intravenous, Q2H PRN, Sunnie Nielsen, DO, 2 mg at 04/13/23 1357   multivitamin with minerals tablet 1 tablet, 1 tablet, Oral, Daily, Sunnie Nielsen, DO, 1 tablet at 04/14/23 0915   [COMPLETED] octreotide (SANDOSTATIN) 2 mcg/mL load via infusion 50 mcg, 50 mcg, Intravenous, Once, 50 mcg at 04/12/23 1107 **AND** octreotide (SANDOSTATIN) 500 mcg in sodium chloride 0.9 % 250 mL (2 mcg/mL) infusion, 50 mcg/hr, Intravenous, Continuous, Jene Every, MD, Last Rate: 25 mL/hr at 04/14/23 0646, 50 mcg/hr at 04/14/23 0646   oxyCODONE (Oxy IR/ROXICODONE) immediate release tablet 10 mg, 10 mg, Oral, Q6H PRN, Jimmye Norman, NP, 10 mg at 04/14/23 0654   pantoprazole (PROTONIX) injection 40 mg, 40 mg, Intravenous, Q12H, Sunnie Nielsen, DO, 40 mg at 04/14/23 5409   PHENObarbital (LUMINAL) injection 97.5 mg, 97.5 mg, Intravenous, Q8H, 97.5 mg at 04/14/23 1157 **FOLLOWED BY** [START ON 04/15/2023] PHENObarbital (LUMINAL) injection 65 mg, 65 mg, Intravenous, Q8H **FOLLOWED BY** [START ON 04/17/2023] PHENObarbital (LUMINAL) injection 32.5 mg, 32.5 mg, Intravenous, Q8H, Effie Shy, RPH   promethazine (PHENERGAN) 12.5 mg in sodium chloride 0.9 % 50 mL IVPB, 12.5 mg, Intravenous, Q6H PRN, Alford Highland, MD   rifaximin Burman Blacksmith)  tablet 550 mg, 550 mg, Oral, BID, Renae Gloss, Richard, MD, 550 mg at 04/14/23 0915   thiamine (VITAMIN B1) tablet 100 mg, 100 mg, Oral, Daily, 100 mg at 04/14/23 0913 **OR** thiamine (VITAMIN B1) injection 100 mg, 100 mg, Intravenous, Daily, Sunnie Nielsen, DO, 100 mg at 04/14/23 0916  sodium chloride Stopped (04/12/23 1111)   cefTRIAXone (ROCEPHIN)  IV Stopped (04/13/23 1250)   octreotide (SANDOSTATIN) 500 mcg in sodium chloride 0.9 % 250 mL (2 mcg/mL) infusion 50 mcg/hr (04/14/23 0646)   promethazine (PHENERGAN) injection (IM or IVPB)      guaiFENesin, LORazepam **OR** LORazepam, morphine injection, oxyCODONE, promethazine (PHENERGAN) injection (IM or IVPB)   Objective    Vitals:   04/14/23 1116 04/14/23 1149 04/14/23 1322 04/14/23 1353  BP: (!) 99/58 103/61 122/67 111/63  Pulse: 67 70 72 63  Resp:  18 16 17   Temp: 99.7 F (37.6 C) 98.4 F (36.9 C) 97.8 F (36.6 C) 98.5 F (36.9 C)  TempSrc:  Oral  Oral  SpO2: 93% 96% 95% 96%  Weight:      Height:         Physical Exam Vitals and nursing note reviewed.  Constitutional:      General: He is not in acute distress.    Appearance: He is ill-appearing. He is not toxic-appearing or diaphoretic.  HENT:     Head: Normocephalic and atraumatic.     Nose: Nose normal.     Mouth/Throat:     Mouth: Mucous membranes are moist.     Pharynx: Oropharynx is clear.  Eyes:     Extraocular Movements: Extraocular movements intact.  Cardiovascular:     Rate and Rhythm: Normal rate and regular rhythm.     Heart sounds: Normal heart sounds. No murmur heard.    No friction rub. No gallop.  Pulmonary:     Effort: Pulmonary effort is normal. No respiratory distress.     Breath sounds: Normal breath sounds. No wheezing, rhonchi or rales.  Abdominal:     General: Bowel sounds are normal. There is no distension.     Palpations: Abdomen is soft.     Tenderness: There is no abdominal tenderness. There is no guarding or rebound.  Musculoskeletal:     Cervical back: Neck supple.     Right lower leg: No edema.     Left lower leg: No edema.  Skin:    General: Skin is warm and dry.     Coloration: Skin is not pale.     Comments: Ecchymosis on his face from recent fall  Neurological:      General: No focal deficit present.     Mental Status: He is alert and oriented to person, place, and time. Mental status is at baseline.  Psychiatric:        Mood and Affect: Mood normal.        Behavior: Behavior normal.        Thought Content: Thought content normal.        Judgment: Judgment normal.      Laboratory Data Recent Labs  Lab 04/12/23 0935 04/12/23 1800 04/13/23 0851 04/14/23 0455  WBC 8.4  --  14.4* 10.4  HGB 7.0* 8.2* 7.4* 6.9*  HCT 21.6* 24.6* 21.5* 20.2*  PLT 81*  --  50* 38*   Recent Labs  Lab 04/12/23 0935 04/13/23 0851 04/14/23 0455  NA 137 134* 133*  K 4.9 4.6 4.4  CL 102 99 98  CO2 22 27 29   BUN  18 24* 26*  CREATININE 0.73 0.79 0.84  CALCIUM 7.1* 7.2* 7.3*  PROT 4.9* 5.4*  --   BILITOT 4.7* 4.0*  --   ALKPHOS 69 70  --   ALT 40 60*  --   AST 127* 180*  --   GLUCOSE 174* 151* 130*   Recent Labs  Lab 04/12/23 0935  INR 1.8*      Imaging Studies: No recent pertinent imaging studies  Assessment:   # ABLA secondary to bleeding varices  # Decompensated alcoholic cirrhosis -Patient with recent esophageal varices bleed status post EVL x 5 on 04/12/2023 -MELD 3.0  -  22  #Portal hypertension with sequelae -Coagulopathy, hyponatremia, thrombocytopenia, varices, hypoalbuminemia, hyperbilirubinemia  # hypotension relating to cirrhosis  #Alcohol abuse  #Alcoholic hepatitis - mdf 41 on presentation  #Hematemesis  Plan:  Continue octreotide for total of 72 hours Continue antibiotics for total of 7 days-currently on ceftriaxone.  Should patient be discharged prior to 7 days can continue 500 mg ciprofloxacin twice daily Continue twice daily PPI p.o. Monitor CBC CMP INR daily  Counseled on low-sodium diet less than 2 g/day Counseled on alcohol cessation CIWA protocol Would not give steroids given recent bleed  Hold dvt ppx Monitor H&H.  Transfusion and resuscitation as per primary team Avoid frequent lab draws to prevent lab  induced anemia Supportive care and antiemetics as per primary team Maintain two sites IV access Avoid nsaids Monitor for GIB.  GI to sign off. Available as needed. Please do not hesitate to call regarding questions or concerns.  Management of other medical comorbidities as per primary team  I personally performed the service.  Thank you for allowing [place] to participate in this patient's care. Please don't hesitate to call if any questions or concerns arise.   Jaynie Collins, DO [facility]  Portions of the record may have been created with voice recognition software. Occasional wrong-word or 'sound-a-like' substitutions may have occurred due to the inherent limitations of voice recognition software.  Read the chart carefully and recognize, using context, where substitutions may have occurred.

## 2023-04-14 NOTE — NC FL2 (Signed)
 [place] MEDICAID FL2 LEVEL OF CARE FORM     IDENTIFICATION  Patient Name: Robert Sellers Birthdate: [birth year] Sex: male Admission Date (Current Location): 04/12/2023  [facility] [place] Number:  Chiropodist and Address:  [facility], [address], [place], [place] [address]      Provider Number: [id number]  Attending Physician Name and Address:  Alford Highland, MD  Relative Name and Phone Number:       Current Level of Care: Hospital Recommended Level of Care: Skilled Nursing Facility Prior Approval Number:    Date Approved/Denied:   PASRR Number: [id number]  Discharge Plan: SNF    Current Diagnoses: Patient Active Problem List   Diagnosis Date Noted   Alcoholic cirrhosis of liver without ascites (HCC) 04/13/2023   Thrombocytopenia (HCC) 04/13/2023   Hyponatremia 04/13/2023   Overweight (BMI 25.0-29.9) 04/13/2023   Elevated liver function tests 04/13/2023   Wrist pain 04/13/2023   Acute pain of right shoulder 04/13/2023   Hematemesis with nausea 04/12/2023   Secondary esophageal varices without bleeding (HCC) 04/12/2023   Insomnia 03/18/2023   Establishing care with new doctor, encounter for 03/18/2023   History of hypertension 03/18/2023   History of hyperlipidemia 03/18/2023   Acute blood loss anemia 10/01/2022   Alcohol abuse 09/30/2022   Bladder stones 06/04/2019    Orientation RESPIRATION BLADDER Height & Weight     Self, Time, Situation, Place  O2 (Nasal Cannula 2 L) Continent Weight: 191 lb 8 oz (86.9 kg) Height:  5\' 10"  (177.8 cm)  BEHAVIORAL SYMPTOMS/MOOD NEUROLOGICAL BOWEL NUTRITION STATUS   (None)  (None) Continent Diet (See discharge summary once available. Currently on full liquids.)  AMBULATORY STATUS COMMUNICATION OF NEEDS Skin   Limited Assist Verbally Bruising                       Personal Care Assistance Level of Assistance  Bathing, Feeding, Dressing Bathing Assistance: Limited  assistance Feeding assistance: Limited assistance Dressing Assistance: Limited assistance     Functional Limitations Info  Sight, Hearing, Speech Sight Info: Adequate Hearing Info: Adequate Speech Info: Adequate    SPECIAL CARE FACTORS FREQUENCY  PT (By licensed PT), OT (By licensed OT)     PT Frequency: 5 x week OT Frequency: 5 x week            Contractures Contractures Info: Not present    Additional Factors Info  Code Status, Allergies Code Status Info: Full code Allergies Info: NKDA           Current Medications (04/14/2023):  This is the current hospital active medication list Current Facility-Administered Medications  Medication Dose Route Frequency Provider Last Rate Last Admin   0.9 %  sodium chloride infusion (Manually program via Guardrails IV Fluids)   Intravenous Once Alford Highland, MD       0.9 %  sodium chloride infusion  10 mL/hr Intravenous Once Jene Every, MD   Held at 04/12/23 1111   benzonatate (TESSALON) capsule 100 mg  100 mg Oral TID Alford Highland, MD   100 mg at 04/14/23 0915   cefTRIAXone (ROCEPHIN) 1 g in sodium chloride 0.9 % 100 mL IVPB  1 g Intravenous Q24H Alford Highland, MD   Stopped at 04/13/23 1250   folic acid (FOLVITE) tablet 1 mg  1 mg Oral Daily Sunnie Nielsen, DO   1 mg at 04/14/23 0918   guaiFENesin (ROBITUSSIN) 100 MG/5ML liquid 5 mL  5 mL Oral Q4H PRN  Alford Highland, MD       LORazepam (ATIVAN) tablet 1-4 mg  1-4 mg Oral Q1H PRN Sunnie Nielsen, DO   1 mg at 04/13/23 4098   Or   LORazepam (ATIVAN) injection 1-4 mg  1-4 mg Intravenous Q1H PRN Sunnie Nielsen, DO   2 mg at 04/12/23 2011   methylPREDNISolone sodium succinate (SOLU-MEDROL) 40 mg/mL injection 40 mg  40 mg Intravenous Daily Alford Highland, MD   40 mg at 04/14/23 1191   morphine (PF) 2 MG/ML injection 2 mg  2 mg Intravenous Q2H PRN Sunnie Nielsen, DO   2 mg at 04/13/23 1357   multivitamin with minerals tablet 1 tablet  1 tablet Oral Daily  Sunnie Nielsen, DO   1 tablet at 04/14/23 0915   octreotide (SANDOSTATIN) 500 mcg in sodium chloride 0.9 % 250 mL (2 mcg/mL) infusion  50 mcg/hr Intravenous Continuous Jene Every, MD 25 mL/hr at 04/14/23 0646 50 mcg/hr at 04/14/23 0646   oxyCODONE (Oxy IR/ROXICODONE) immediate release tablet 10 mg  10 mg Oral Q6H PRN Jimmye Norman, NP   10 mg at 04/14/23 0654   pantoprazole (PROTONIX) injection 40 mg  40 mg Intravenous Q12H Sunnie Nielsen, DO   40 mg at 04/14/23 4782   PHENObarbital (LUMINAL) injection 97.5 mg  97.5 mg Intravenous Q8H Effie Shy, RPH   97.5 mg at 04/14/23 1157   Followed by   Melene Muller ON 04/15/2023] PHENObarbital (LUMINAL) injection 65 mg  65 mg Intravenous Q8H Effie Shy, RPH       Followed by   Melene Muller ON 04/17/2023] PHENObarbital (LUMINAL) injection 32.5 mg  32.5 mg Intravenous Q8H Effie Shy, RPH       promethazine (PHENERGAN) 12.5 mg in sodium chloride 0.9 % 50 mL IVPB  12.5 mg Intravenous Q6H PRN Wieting, Richard, MD       rifaximin Burman Blacksmith) tablet 550 mg  550 mg Oral BID Alford Highland, MD   550 mg at 04/14/23 0915   thiamine (VITAMIN B1) tablet 100 mg  100 mg Oral Daily Sunnie Nielsen, DO   100 mg at 04/14/23 9562   Or   thiamine (VITAMIN B1) injection 100 mg  100 mg Intravenous Daily Sunnie Nielsen, DO   100 mg at 04/14/23 1308     Discharge Medications: Please see discharge summary for a list of discharge medications.  Relevant Imaging Results:  Relevant Lab Results:   Additional Information SS#: [id number]  Margarito Liner, LCSW

## 2023-04-14 NOTE — Plan of Care (Signed)

## 2023-04-14 NOTE — Progress Notes (Signed)
 Progress Note   Patient: [name] QVZ:[id number] DOB: 02/27/1980 DOA: 04/12/2023     2 DOS: the patient was seen and examined on 04/14/2023   Brief hospital course: 43 y.o. male with an extensive history of alcohol abuse who came in with hematemesis.  The patient has a history of upper GI bleeding in the past and had an upper endoscopy by Dr. Norma Fredrickson in August 2024 with grade 1 esophageal varices with no intervention done at that time.  He reports that he drinks approximately a pint of whiskey or vodka a day but his wife states that it is been much more than that. Pt confirms has been vomiting up blood few days now.    Hospital course:  In ED, Hgb 7.0. Started on protonix and octreotide. 2 units PRBC. Admitted to hospitalist service. GI performed EGD - Grade III varices were found in the lower third of the esophagus. Five bands were successfully placed with incomplete eradication of varices. Continue octreotide 72h. If further bleeding will need TIPS.   3/12.  Hemoglobin 7.4.  No bowel movement since coming in.  Had esophageal banding yesterday.  When I walked in the patient was not on octreotide drip.  Spoke with pharmacist and nursing staff to get octreotide drip going.  Patient complains of right arm and bilateral wrist pain.  Patient had a fall off the toilet prior to coming in. 3/13.  Hemoglobin 6.9 will give 1 unit of packed red blood cells.  No signs of bleeding.  Right shoulder feeling better than yesterday.    Assessment and Plan: * Acute blood loss anemia Patient given 1 unit of packed red blood cells on admission.  Has not had a bowel movement since coming in.  Continue to monitor.  Last hemoglobin 6.9.  Will give 1 more unit of packed red blood cells today.  Secondary esophageal varices without bleeding (HCC) Patient had EGD and banding of esophageal varices.  Continue octreotide drip.  Continue Protonix.  Empiric Rocephin.  Advance to soft diet later today  Alcoholic cirrhosis  of liver without ascites (HCC) Seen on CT scan back in 2024.  Patient has thrombocytopenia, elevated liver function test.  Elevation in INR.  And slightly elevated ammonia level.  Will start Xifaxan.  Given a dose of vitamin K yesterday.  Alcohol abuse Patient on phenobarbital alcohol and Ativan withdrawal protocol  Hematemesis with nausea No further vomiting  Thrombocytopenia (HCC) Platelet count 38.  Continue to monitor.  Elevated liver function tests AST 188, ALT 60.  Total bilirubin 4.0 continue to monitor.  Acute pain of right shoulder At deltoid insertion.  Continue Solu-Medrol since able to move shoulder better today.  PT and OT evaluations appreciated  Wrist pain Bilateral.  X-rays negative  Overweight (BMI 25.0-29.9) BMI 27.48 with current height and weight in computer  Hyponatremia Sodium 133 (2 points less than normal range).        Subjective: No further vomiting.  Shoulder pain a little bit better today than yesterday.  Hemoglobin down to 6.9 but no signs of bleeding.  Will transfuse another unit of blood today.  Physical Exam: Vitals:   04/14/23 1116 04/14/23 1149 04/14/23 1322 04/14/23 1353  BP: (!) 99/58 103/61 122/67 111/63  Pulse: 67 70 72 63  Resp:  18 16 17   Temp: 99.7 F (37.6 C) 98.4 F (36.9 C) 97.8 F (36.6 C) 98.5 F (36.9 C)  TempSrc:  Oral  Oral  SpO2: 93% 96% 95% 96%  Weight:  Height:       Physical Exam HENT:     Head: Normocephalic.     Mouth/Throat:     Pharynx: No oropharyngeal exudate.  Eyes:     General: Lids are normal.     Comments: Conjunctiva icteric  Cardiovascular:     Rate and Rhythm: Normal rate and regular rhythm.     Heart sounds: Normal heart sounds, S1 normal and S2 normal.  Pulmonary:     Breath sounds: No decreased breath sounds, wheezing, rhonchi or rales.  Abdominal:     Palpations: Abdomen is soft.     Tenderness: There is no abdominal tenderness.  Musculoskeletal:     Comments: Still with some  pain to palpation over deltoid insertion lateral arm.  Patient able to lift up his right arm.  Skin:    General: Skin is warm.     Findings: No rash.  Neurological:     Mental Status: He is alert and oriented to person, place, and time.     Data Reviewed: Sodium 133, creatinine 0.84, ammonia level 46, white blood cell count 10.4, hemoglobin 6.9, platelet count 38  Family Communication: Family at bedside  Disposition: Status is: Inpatient Remains inpatient appropriate because: Transfusing 1 unit of packed red blood cells today.  May end up needing platelets if any further signs of bleeding.  Planned Discharge Destination: Skilled nursing facility    Time spent: 28 minutes  Author: Alford Highland, MD 04/14/2023 2:04 PM  For on call review [email or web address].

## 2023-04-15 DIAGNOSIS — F1010 Alcohol abuse, uncomplicated: Secondary | ICD-10-CM | POA: Diagnosis not present

## 2023-04-15 DIAGNOSIS — I8510 Secondary esophageal varices without bleeding: Secondary | ICD-10-CM | POA: Diagnosis not present

## 2023-04-15 DIAGNOSIS — K7030 Alcoholic cirrhosis of liver without ascites: Secondary | ICD-10-CM | POA: Diagnosis not present

## 2023-04-15 DIAGNOSIS — D62 Acute posthemorrhagic anemia: Secondary | ICD-10-CM | POA: Diagnosis not present

## 2023-04-15 LAB — COMPREHENSIVE METABOLIC PANEL
ALT: 51 U/L — ABNORMAL HIGH (ref 0–44)
AST: 130 U/L — ABNORMAL HIGH (ref 15–41)
Albumin: 2.1 g/dL — ABNORMAL LOW (ref 3.5–5.0)
Alkaline Phosphatase: 69 U/L (ref 38–126)
Anion gap: 6 (ref 5–15)
BUN: 22 mg/dL — ABNORMAL HIGH (ref 6–20)
CO2: 29 mmol/L (ref 22–32)
Calcium: 7.1 mg/dL — ABNORMAL LOW (ref 8.9–10.3)
Chloride: 96 mmol/L — ABNORMAL LOW (ref 98–111)
Creatinine, Ser: 0.78 mg/dL (ref 0.61–1.24)
GFR, Estimated: >60 mL/min (ref >60)
Glucose, Bld: 112 mg/dL — ABNORMAL HIGH (ref 70–99)
Potassium: 4 mmol/L (ref 3.5–5.1)
Sodium: 131 mmol/L — ABNORMAL LOW (ref 135–145)
Total Bilirubin: 3.6 mg/dL — ABNORMAL HIGH (ref 0.0–1.2)
Total Protein: 5.1 g/dL — ABNORMAL LOW (ref 6.5–8.1)

## 2023-04-15 LAB — CBC WITH DIFFERENTIAL/PLATELET
Abs Immature Granulocytes: 0.04 10*3/uL (ref 0.00–0.07)
Basophils Absolute: 0 10*3/uL (ref 0.0–0.1)
Basophils Relative: 0 %
Eosinophils Absolute: 0 10*3/uL (ref 0.0–0.5)
Eosinophils Relative: 0 %
HCT: 20 % — ABNORMAL LOW (ref 39.0–52.0)
Hemoglobin: 6.9 g/dL — ABNORMAL LOW (ref 13.0–17.0)
Immature Granulocytes: 1 %
Lymphocytes Relative: 20 %
Lymphs Abs: 1 10*3/uL (ref 0.7–4.0)
MCH: 32.2 pg (ref 26.0–34.0)
MCHC: 34.5 g/dL (ref 30.0–36.0)
MCV: 93.5 fL (ref 80.0–100.0)
Monocytes Absolute: 0.4 10*3/uL (ref 0.1–1.0)
Monocytes Relative: 8 %
Neutro Abs: 3.6 10*3/uL (ref 1.7–7.7)
Neutrophils Relative %: 71 %
Platelets: 29 10*3/uL — CL (ref 150–400)
RBC: 2.14 MIL/uL — ABNORMAL LOW (ref 4.22–5.81)
RDW: 18.2 % — ABNORMAL HIGH (ref 11.5–15.5)
Smear Review: NORMAL
WBC: 5 10*3/uL (ref 4.0–10.5)
nRBC: 0 % (ref 0.0–0.2)

## 2023-04-15 LAB — TECHNOLOGIST SMEAR REVIEW: Plt Morphology: NORMAL

## 2023-04-15 LAB — CBC
HCT: 20.3 % — ABNORMAL LOW (ref 39.0–52.0)
Hemoglobin: 6.9 g/dL — ABNORMAL LOW (ref 13.0–17.0)
MCH: 32.2 pg (ref 26.0–34.0)
MCHC: 34 g/dL (ref 30.0–36.0)
MCV: 94.9 fL (ref 80.0–100.0)
Platelets: 28 10*3/uL — CL (ref 150–400)
RBC: 2.14 MIL/uL — ABNORMAL LOW (ref 4.22–5.81)
RDW: 18.4 % — ABNORMAL HIGH (ref 11.5–15.5)
WBC: 5 10*3/uL (ref 4.0–10.5)
nRBC: 0.4 % — ABNORMAL HIGH (ref 0.0–0.2)

## 2023-04-15 LAB — PREPARE RBC (CROSSMATCH)

## 2023-04-15 LAB — HEMOGLOBIN: Hemoglobin: 8.6 g/dL — ABNORMAL LOW (ref 13.0–17.0)

## 2023-04-15 MED ORDER — POLYETHYLENE GLYCOL 3350 17 G PO PACK
17.0000 g | PACK | Freq: Every day | ORAL | Status: DC
  Administered 2023-04-15: 17 g via ORAL
  Filled 2023-04-15 (×2): qty 1

## 2023-04-15 MED ORDER — OXYCODONE HCL 5 MG PO TABS
10.0000 mg | ORAL_TABLET | Freq: Four times a day (QID) | ORAL | Status: DC | PRN
  Administered 2023-04-15: 10 mg via ORAL
  Filled 2023-04-15: qty 2

## 2023-04-15 MED ORDER — SODIUM CHLORIDE 0.9% IV SOLUTION: Freq: Once | INTRAVENOUS | Status: AC

## 2023-04-15 MED ORDER — LACTULOSE 10 GM/15ML PO SOLN
20.0000 g | Freq: Once | ORAL | Status: AC
  Administered 2023-04-15: 20 g via ORAL
  Filled 2023-04-15: qty 30

## 2023-04-15 NOTE — Progress Notes (Signed)
 Physical Therapy Treatment Patient Details Name: Robert Sellers MRN: [id number] DOB: Oct 29, 1980 Today's Date: 04/15/2023   History of Present Illness Pt is a 43 y.o. male who presents with hematemesis the last few days, fall with shoulder and wrist pain and found to have grade III varices  in the lower third of the esophagus with 5 bands successfully placed. PMH: alcohol abuse, alcoholic cirrhosis, upper GI bleed.    PT Comments  Patient received in bed, he is agreeable to PT session. Reports he walked 2 laps around nursing station earlier. Patient is mod I with bed mobility. Transfers with cga. He is mildly unsteady initially with standing and gait. Patient pushing IV pole ambulated 400 feet with cga. He is making good progress. Will continue to benefit from skilled PT while here.     If plan is discharge home, recommend the following: A little help with walking and/or transfers;A little help with bathing/dressing/bathroom;Assist for transportation;Help with stairs or ramp for entrance   Can travel by private vehicle     No  Equipment Recommendations  None recommended by PT    Recommendations for Other Services       Precautions / Restrictions Precautions Precautions: Fall Recall of Precautions/Restrictions: Intact Restrictions Weight Bearing Restrictions Per Provider Order: No     Mobility  Bed Mobility Overal bed mobility: Modified Independent Bed Mobility: Supine to Sit, Sit to Supine     Supine to sit: Modified independent (Device/Increase time) Sit to supine: Modified independent (Device/Increase time)        Transfers Overall transfer level: Needs assistance Equipment used: None Transfers: Sit to/from Stand Sit to Stand: Contact guard assist           General transfer comment: Little unsteady with initial mobility. Posterior leaning    Ambulation/Gait Ambulation/Gait assistance: Contact guard assist Gait Distance (Feet): 400 Feet Assistive device: IV  Pole Gait Pattern/deviations: Step-through pattern, Decreased step length - right, Decreased step length - left, Decreased stride length Gait velocity: decr     General Gait Details: posterior lean initially with mobility   Stairs             Wheelchair Mobility     Tilt Bed    Modified Rankin (Stroke Patients Only)       Balance Overall balance assessment: Needs assistance Sitting-balance support: Feet supported Sitting balance-Leahy Scale: Good     Standing balance support: Bilateral upper extremity supported, During functional activity Standing balance-Leahy Scale: Fair                              Hotel manager: No apparent difficulties  Cognition Arousal: Alert Behavior During Therapy: WFL for tasks assessed/performed   PT - Cognitive impairments: No apparent impairments                         Following commands: Intact      Cueing Cueing Techniques: Verbal cues  Exercises      General Comments        Pertinent Vitals/Pain Pain Assessment Pain Assessment: No/denies pain Faces Pain Scale: Hurts a little bit Pain Location: R shoulder Pain Descriptors / Indicators: Discomfort, Sore Pain Intervention(s): Monitored during session    Home Living                          Prior Function  PT Goals (current goals can now be found in the care plan section) Acute Rehab PT Goals Patient Stated Goal: to feel better, go home PT Goal Formulation: With patient Time For Goal Achievement: 04/27/23 Potential to Achieve Goals: Good Progress towards PT goals: Progressing toward goals    Frequency    Min 2X/week      PT Plan      Co-evaluation              AM-PAC PT "6 Clicks" Mobility   Outcome Measure  Help needed turning from your back to your side while in a flat bed without using bedrails?: None Help needed moving from lying on your back to sitting on the side  of a flat bed without using bedrails?: None Help needed moving to and from a bed to a chair (including a wheelchair)?: A Little Help needed standing up from a chair using your arms (e.g., wheelchair or bedside chair)?: A Little Help needed to walk in hospital room?: A Little Help needed climbing 3-5 steps with a railing? : A Little 6 Click Score: 20    End of Session   Activity Tolerance: Patient tolerated treatment well Patient left: in bed;with call bell/phone within reach;with bed alarm set Nurse Communication: Mobility status PT Visit Diagnosis: Unsteadiness on feet (R26.81);Muscle weakness (generalized) (M62.81);Difficulty in walking, not elsewhere classified (R26.2) Pain - Right/Left: Right Pain - part of body: Shoulder     Time: 1530-1540 PT Time Calculation (min) (ACUTE ONLY): 10 min  Charges:    $Gait Training: 8-22 mins PT General Charges $$ ACUTE PT VISIT: 1 Visit                     Anasofia Micallef, PT, GCS 04/15/23,3:52 PM

## 2023-04-15 NOTE — Progress Notes (Signed)
       CROSS COVER NOTE  NAME: Robert Sellers MRN: [id number] DOB : 05/12/1980 ATTENDING PHYSICIAN: Alford Highland, MD    Date of Service   04/15/2023   HPI/Events of Note   Critical platelets at 28 reported  Interventions   Assessment/Plan: No active bleed Remains on octreotide Hgb remains 6.9 despite 1 unit of blood transfused yesterday  Transfuse 1 unit PRBC        Donnie Mesa NP [facility] Cross Cover 7pm-7am - check amion for availability Pager [phone]

## 2023-04-15 NOTE — Progress Notes (Signed)
 Progress Note   Patient: Robert Sellers ZOX:[id number] DOB: 06/07/1980 DOA: 04/12/2023     3 DOS: the patient was seen and examined on 04/15/2023   Brief hospital course: 43 y.o. male with an extensive history of alcohol abuse who came in with hematemesis.  The patient has a history of upper GI bleeding in the past and had an upper endoscopy by Dr. Norma Fredrickson in August 2024 with grade 1 esophageal varices with no intervention done at that time.  He reports that he drinks approximately a pint of whiskey or vodka a day but his wife states that it is been much more than that. Pt confirms has been vomiting up blood few days now.    Hospital course:  In ED, Hgb 7.0. Started on protonix and octreotide. 2 units PRBC. Admitted to hospitalist service. GI performed EGD - Grade III varices were found in the lower third of the esophagus. Five bands were successfully placed with incomplete eradication of varices. Continue octreotide 72h. If further bleeding will need TIPS.   3/12.  Hemoglobin 7.4.  No bowel movement since coming in.  Had esophageal banding yesterday.  When I walked in the patient was not on octreotide drip.  Spoke with pharmacist and nursing staff to get octreotide drip going.  Patient complains of right arm and bilateral wrist pain.  Patient had a fall off the toilet prior to coming in. 3/13.  Hemoglobin 6.9 will give 1 unit of packed red blood cells.  No signs of bleeding.  Right shoulder feeling better than yesterday. 3/14.  Hemoglobin still 6.9.  Ordered for another unit of packed red blood cells.  No bowel movement since admission.  MiraLAX given.  Will give a dose of lactulose.  Assessment and Plan: * Acute blood loss anemia Today's hemoglobin still 6.9.  Will give another unit of packed red blood cells today.  Received 2 other units during the hospital course.  No signs of bleeding currently since he has not had a bowel movement since coming in.  Secondary esophageal varices without bleeding  (HCC) Patient had EGD and banding of esophageal varices.  Continue octreotide drip.  Continue Protonix.  Empiric Rocephin.  Continue soft diet  Alcoholic cirrhosis of liver without ascites (HCC) Seen on CT scan back in 2024.  Patient has thrombocytopenia, elevated liver function test.  Elevation in INR.  And slightly elevated ammonia level.  Continue Xifaxan.  Given a dose of vitamin K during the hospital.  Alcohol abuse Patient on phenobarbital alcohol and Ativan withdrawal protocol  Hematemesis with nausea No further vomiting  Thrombocytopenia (HCC) Platelet count 28.  Continue to monitor.  If any bleeding will likely have to give platelets also.  Elevated liver function tests AST 130, ALT 51.  Total bilirubin 3.6 continue to monitor.  Acute pain of right shoulder At deltoid insertion.  Continue Solu-Medrol since shoulder is improving..  PT and OT evaluations appreciated  Wrist pain Bilateral.  X-rays negative  Overweight (BMI 25.0-29.9) BMI 27.48 with current height and weight in computer  Hyponatremia Sodium 131        Subjective: Patient feeling okay.  No abdominal pain.  Still having some pain in the right shoulder.  No bowel movement since coming in.  Physical Exam: Vitals:   04/15/23 0703 04/15/23 0859 04/15/23 0930 04/15/23 1246  BP: 127/75 107/73 108/68 114/75  Pulse: 67 63 62 67  Resp: 16  18   Temp: 98.2 F (36.8 C) 98.1 F (36.7 C) 98.3 F (36.8  C) 98.1 F (36.7 C)  TempSrc: Oral  Oral   SpO2: 100% 96%  92%  Weight:      Height:       Physical Exam HENT:     Head: Normocephalic.     Mouth/Throat:     Pharynx: No oropharyngeal exudate.  Eyes:     General: Lids are normal.  Cardiovascular:     Rate and Rhythm: Normal rate and regular rhythm.     Heart sounds: Normal heart sounds, S1 normal and S2 normal.  Pulmonary:     Breath sounds: No decreased breath sounds, wheezing, rhonchi or rales.  Abdominal:     Palpations: Abdomen is soft.      Tenderness: There is no abdominal tenderness.  Musculoskeletal:     Comments: Still with some pain to palpation over deltoid insertion lateral arm.  Patient able to lift up his right arm.  Skin:    General: Skin is warm.     Findings: No rash.  Neurological:     Mental Status: He is alert and oriented to person, place, and time.     Data Reviewed: Sodium 131, creatinine 0.78, AST 130, ALT 51, total bilirubin 3.6, hemoglobin 6.9, platelet count 29, normal platelet morphology.   Disposition: Status is: Inpatient Remains inpatient appropriate because: Hemoglobin 6.9 and giving a little unit of blood today.  Planned Discharge Destination: Rehab    Time spent: 28 minutes  Author: Alford Highland, MD 04/15/2023 2:09 PM  For on call review [email or web address].

## 2023-04-15 NOTE — Plan of Care (Signed)

## 2023-04-15 NOTE — Plan of Care (Signed)
  Problem: Health Behavior/Discharge Planning: Goal: Ability to manage health-related needs will improve Outcome: Progressing   Problem: Clinical Measurements: Goal: Diagnostic test results will improve Outcome: Progressing   Problem: Elimination: Goal: Will not experience complications related to bowel motility Outcome: Progressing   Problem: Skin Integrity: Goal: Risk for impaired skin integrity will decrease Outcome: Progressing

## 2023-04-16 DIAGNOSIS — D62 Acute posthemorrhagic anemia: Secondary | ICD-10-CM | POA: Diagnosis not present

## 2023-04-16 DIAGNOSIS — I8510 Secondary esophageal varices without bleeding: Secondary | ICD-10-CM | POA: Diagnosis not present

## 2023-04-16 DIAGNOSIS — F1010 Alcohol abuse, uncomplicated: Secondary | ICD-10-CM | POA: Diagnosis not present

## 2023-04-16 DIAGNOSIS — K7030 Alcoholic cirrhosis of liver without ascites: Secondary | ICD-10-CM | POA: Diagnosis not present

## 2023-04-16 LAB — CBC
HCT: 23.8 % — ABNORMAL LOW (ref 39.0–52.0)
Hemoglobin: 8.2 g/dL — ABNORMAL LOW (ref 13.0–17.0)
MCH: 31.8 pg (ref 26.0–34.0)
MCHC: 34.5 g/dL (ref 30.0–36.0)
MCV: 92.2 fL (ref 80.0–100.0)
Platelets: 33 10*3/uL — ABNORMAL LOW (ref 150–400)
RBC: 2.58 MIL/uL — ABNORMAL LOW (ref 4.22–5.81)
RDW: 17.4 % — ABNORMAL HIGH (ref 11.5–15.5)
WBC: 5.3 10*3/uL (ref 4.0–10.5)
nRBC: 0 % (ref 0.0–0.2)

## 2023-04-16 LAB — TYPE AND SCREEN
ABO/RH(D): O POS
Antibody Screen: NEGATIVE
Unit division: 0
Unit division: 0
Unit division: 0

## 2023-04-16 LAB — BPAM RBC
Blood Product Expiration Date: 202503272359
Blood Product Expiration Date: 202503272359
Blood Product Expiration Date: 202503282359
ISSUE DATE / TIME: 202503111153
ISSUE DATE / TIME: 202503131111
ISSUE DATE / TIME: 202503140629
Unit Type and Rh: 5100
Unit Type and Rh: 5100
Unit Type and Rh: 5100

## 2023-04-16 LAB — LACTATE DEHYDROGENASE: LDH: 162 U/L (ref 98–192)

## 2023-04-16 MED ORDER — ADULT MULTIVITAMIN W/MINERALS CH: 1.0000 | ORAL_TABLET | Freq: Every day | ORAL | Status: DC

## 2023-04-16 MED ORDER — CIPROFLOXACIN HCL 500 MG PO TABS: 500.0000 mg | ORAL_TABLET | Freq: Two times a day (BID) | ORAL | 0 refills | Status: AC

## 2023-04-16 MED ORDER — PREDNISONE 20 MG PO TABS
40.0000 mg | ORAL_TABLET | Freq: Once | ORAL | Status: AC
Start: 2023-04-16 — End: 2023-04-16
  Administered 2023-04-16: 40 mg via ORAL
  Filled 2023-04-16: qty 4

## 2023-04-16 MED ORDER — NADOLOL 20 MG PO TABS: 10.0000 mg | ORAL_TABLET | Freq: Every day | ORAL | 0 refills | Status: DC

## 2023-04-16 MED ORDER — FOLIC ACID 1 MG PO TABS: 1.0000 mg | ORAL_TABLET | Freq: Every day | ORAL | 0 refills | Status: DC

## 2023-04-16 MED ORDER — PANTOPRAZOLE SODIUM 40 MG PO TBEC
40.0000 mg | DELAYED_RELEASE_TABLET | Freq: Two times a day (BID) | ORAL | Status: DC
  Administered 2023-04-16: 40 mg via ORAL
  Filled 2023-04-16: qty 1

## 2023-04-16 MED ORDER — CIPROFLOXACIN HCL 500 MG PO TABS
500.0000 mg | ORAL_TABLET | Freq: Two times a day (BID) | ORAL | Status: DC
  Administered 2023-04-16: 500 mg via ORAL
  Filled 2023-04-16: qty 1

## 2023-04-16 MED ORDER — PANTOPRAZOLE SODIUM 40 MG PO TBEC: 40.0000 mg | DELAYED_RELEASE_TABLET | Freq: Two times a day (BID) | ORAL | 0 refills | Status: DC

## 2023-04-16 MED ORDER — RIFAXIMIN 550 MG PO TABS: 550.0000 mg | ORAL_TABLET | Freq: Two times a day (BID) | ORAL | 0 refills | Status: DC

## 2023-04-16 MED ORDER — VITAMIN B-1 100 MG PO TABS: 100.0000 mg | ORAL_TABLET | Freq: Every day | ORAL | 0 refills | Status: DC

## 2023-04-16 MED ORDER — PREDNISONE 10 MG PO TABS: ORAL_TABLET | ORAL | 0 refills | Status: DC

## 2023-04-16 NOTE — Discharge Summary (Signed)
 Physician Discharge Summary   Patient: Robert Sellers MRN: [id number] DOB: 1980/05/02  Admit date:     04/12/2023  Discharge date: 04/16/23  Discharge Physician: Alford Highland   PCP: Modesto Charon, NP   Recommendations at discharge:   Follow-up PCP 5 days Follow-up gastroenterology in 3 weeks for repeat endoscopy  Discharge Diagnoses: Principal Problem:   Acute blood loss anemia Active Problems:   Secondary esophageal varices without bleeding (HCC)   Alcoholic cirrhosis of liver without ascites (HCC)   Alcohol abuse   Hematemesis with nausea   Thrombocytopenia (HCC)   Elevated liver function tests   Hyponatremia   Overweight (BMI 25.0-29.9)   Wrist pain   Acute pain of right shoulder    Hospital Course: 43 y.o. male with an extensive history of alcohol abuse who came in with hematemesis.  The patient has a history of upper GI bleeding in the past and had an upper endoscopy by Dr. Norma Fredrickson in August 2024 with grade 1 esophageal varices with no intervention done at that time.  He reports that he drinks approximately a pint of whiskey or vodka a day but his wife states that it is been much more than that. Pt confirms has been vomiting up blood few days now.    Hospital course:  In ED, Hgb 7.0. Started on protonix and octreotide. 2 units PRBC. Admitted to hospitalist service. GI performed EGD - Grade III varices were found in the lower third of the esophagus. Five bands were successfully placed with incomplete eradication of varices. Continue octreotide 72h. If further bleeding will need TIPS.   3/12.  Hemoglobin 7.4.  No bowel movement since coming in.  Had esophageal banding yesterday.  When I walked in the patient was not on octreotide drip.  Spoke with pharmacist and nursing staff to get octreotide drip going.  Patient complains of right arm and bilateral wrist pain.  Patient had a fall off the toilet prior to coming in. 3/13.  Hemoglobin 6.9 will give 1 unit of packed red blood  cells.  No signs of bleeding.  Right shoulder feeling better than yesterday. 3/14.  Hemoglobin still 6.9.  Ordered for another unit of packed red blood cells.  No bowel movement since admission.  MiraLAX given.  Will give a dose of lactulose. 3/15.  Hemoglobin 8.2 this morning.  Patient stated he had numerous bowel movements but did not see any blood.  Patient states he has to get out of here today.  Assessment and Plan: * Acute blood loss anemia Today's hemoglobin 8.2. Patient received 3 units of packed red blood cells during the hospital course.  Patient states he needs to get out of here today.  Patient had a few bowel movements but states there was no blood.  Secondary esophageal varices without bleeding (HCC) Patient had EGD and banding of esophageal varices.  Octreotide drip stopped today.  Protonix switched to oral twice a day.  Rocephin switched to Cipro to complete 1 week course.  Continue soft diet.  Low-dose nadolol.  Alcoholic cirrhosis of liver without ascites (HCC) Seen on CT scan back in 2024.  Patient has thrombocytopenia, elevated liver function test.  Elevation in INR.  And slightly elevated ammonia level.  Continue Xifaxan.  Given a dose of vitamin K during the hospital.  If he wants to consider a liver transplant in the future he has to stop drinking.  Refer to gastroenterology as outpatient.  Alcohol abuse Patient received phenobarbital and Ativan withdrawal protocol while here.  No signs of withdrawal upon discharge.  Hematemesis with nausea No further vomiting  Thrombocytopenia (HCC) Platelet count 33.  Continue to monitor as outpatient.  Elevated liver function tests AST 130, ALT 51.  Total bilirubin 3.6.  Continue to monitor as outpatient.  Acute pain of right shoulder At deltoid insertion.  Patient received Solu-Medrol here.  Will switch over to prednisone taper upon going home.  Wrist pain Bilateral.  X-rays negative  Overweight (BMI 25.0-29.9) BMI 27.48  with current height and weight in computer  Hyponatremia Sodium 131         Consultants: Gastroenterology Procedures performed: EGD with esophageal banding Disposition: Home health Diet recommendation:  Regular diet DISCHARGE MEDICATION: Allergies as of 04/16/2023   No Known Allergies      Medication List     STOP taking these medications    azithromycin 250 MG tablet Commonly known as: ZITHROMAX   traZODone 100 MG tablet Commonly known as: DESYREL       TAKE these medications    ciprofloxacin 500 MG tablet Commonly known as: Cipro Take 1 tablet (500 mg total) by mouth 2 (two) times daily for 3 days. Start taking on: April 17, 2023   folic acid 1 MG tablet Commonly known as: FOLVITE Take 1 tablet (1 mg total) by mouth daily.   multivitamin with minerals Tabs tablet Take 1 tablet by mouth daily.   nadolol 20 MG tablet Commonly known as: Corgard Take 0.5 tablets (10 mg total) by mouth daily.   pantoprazole 40 MG tablet Commonly known as: Protonix Take 1 tablet (40 mg total) by mouth 2 (two) times daily.   predniSONE 10 MG tablet Commonly known as: DELTASONE 3 tabs po day1; 2 tabs  po day2; 1 tab po day3,4; 1/2 tab po day5,6 Start taking on: April 17, 2023   rifaximin 550 MG Tabs tablet Commonly known as: XIFAXAN Take 1 tablet (550 mg total) by mouth 2 (two) times daily.   thiamine 100 MG tablet Commonly known as: Vitamin B-1 Take 1 tablet (100 mg total) by mouth daily.        Follow-up Information     Modesto Charon, NP Follow up in 5 day(s).   Specialty: General Practice Contact information: [address] [place] [address] [phone]         Jaynie Collins, DO Follow up in 3 week(s).   Specialty: Gastroenterology Why: will need repeat egd for esophageal varices Contact information: [address] [place] [address] [phone]                Discharge Exam: Ceasar Mons Weights   04/12/23 0102  04/12/23 1445 04/14/23 0314  Weight: 86.2 kg 86.2 kg 86.9 kg   Physical Exam HENT:     Head: Normocephalic.     Mouth/Throat:     Pharynx: No oropharyngeal exudate.  Eyes:     General: Lids are normal.  Cardiovascular:     Rate and Rhythm: Normal rate and regular rhythm.     Heart sounds: Normal heart sounds, S1 normal and S2 normal.  Pulmonary:     Breath sounds: No decreased breath sounds, wheezing, rhonchi or rales.  Abdominal:     Palpations: Abdomen is soft.     Tenderness: There is no abdominal tenderness.  Musculoskeletal:     Comments: Still with some pain to palpation over deltoid insertion lateral arm.  Patient able to lift up his right arm.  Skin:    General: Skin is  warm.     Findings: No rash.  Neurological:     Mental Status: He is alert and oriented to person, place, and time.      Condition at discharge: stable  The results of significant diagnostics from this hospitalization (including imaging, microbiology, ancillary and laboratory) are listed below for reference.   Imaging Studies: DG Elbow Complete Right Result Date: 04/12/2023 CLINICAL DATA:  Pain after fall. EXAM: RIGHT ELBOW - COMPLETE 3+ VIEW COMPARISON:  None Available. FINDINGS: There is no evidence of fracture, dislocation, or joint effusion. The alignment and joint spaces are preserved. There is no evidence of arthropathy or other focal bone abnormality. Soft tissues are unremarkable. IMPRESSION: Negative radiographs of the right elbow. Electronically Signed   By: Narda Rutherford M.D.   On: 04/12/2023 12:39   DG Wrist Complete Right Result Date: 04/12/2023 CLINICAL DATA:  Pain after fall. EXAM: RIGHT WRIST - COMPLETE 3+ VIEW COMPARISON:  None Available. FINDINGS: There is no evidence of fracture or dislocation. Alignment and joint spaces are preserved. Healed fifth metacarpal fracture appears remote. Minor degenerative spurring. Soft tissues are unremarkable. IMPRESSION: 1. No acute fracture or  subluxation of the right wrist. 2. Healed fifth metacarpal fracture. Electronically Signed   By: Narda Rutherford M.D.   On: 04/12/2023 12:38   DG Wrist Complete Left Result Date: 04/12/2023 CLINICAL DATA:  Pain after fall. EXAM: LEFT WRIST - COMPLETE 3+ VIEW COMPARISON:  None Available. FINDINGS: There is no evidence of fracture or dislocation. Alignment and joint spaces are preserved. Occasional degenerative spurring. Soft tissues are unremarkable. IMPRESSION: No fracture or subluxation of the left wrist. Electronically Signed   By: Narda Rutherford M.D.   On: 04/12/2023 12:38   DG Shoulder Right Portable Result Date: 04/12/2023 CLINICAL DATA:  Pain after fall. EXAM: RIGHT SHOULDER - 1 VIEW COMPARISON:  None Available. FINDINGS: There is no evidence of fracture or dislocation. The alignment and joint spaces are preserved. There is no evidence of arthropathy or other focal bone abnormality. Soft tissues are unremarkable. IMPRESSION: Negative radiographs of the right shoulder. Electronically Signed   By: Narda Rutherford M.D.   On: 04/12/2023 12:37    Labs: CBC: Recent Labs  Lab 04/12/23 0935 04/12/23 1800 04/13/23 0851 04/14/23 0455 04/14/23 1852 04/15/23 0524 04/15/23 1404 04/16/23 0315  WBC 8.4  --  14.4* 10.4  --  5.0  5.0  --  5.3  NEUTROABS  --   --   --   --   --  3.6  --   --   HGB 7.0*   < > 7.4* 6.9* 6.9* 6.9*  6.9* 8.6* 8.2*  HCT 21.6*   < > 21.5* 20.2* 20.2* 20.0*  20.3*  --  23.8*  MCV 99.1  --  92.7 94.8  --  93.5  94.9  --  92.2  PLT 81*  --  50* 38*  --  29*  28*  --  33*   < > = values in this interval not displayed.   Basic Metabolic Panel: Recent Labs  Lab 04/12/23 0935 04/13/23 0851 04/14/23 0455 04/15/23 0524  NA 137 134* 133* 131*  K 4.9 4.6 4.4 4.0  CL 102 99 98 96*  CO2 22 27 29 29   GLUCOSE 174* 151* 130* 112*  BUN 18 24* 26* 22*  CREATININE 0.73 0.79 0.84 0.78  CALCIUM 7.1* 7.2* 7.3* 7.1*   Liver Function Tests: Recent Labs  Lab  04/12/23 0935 04/13/23 0851 04/15/23 0524  AST 127*  180* 130*  ALT 40 60* 51*  ALKPHOS 69 70 69  BILITOT 4.7* 4.0* 3.6*  PROT 4.9* 5.4* 5.1*  ALBUMIN 1.9* 2.2* 2.1*     Discharge time spent: greater than 30 minutes.  Signed: Alford Highland, MD [facility] 04/16/2023

## 2023-04-16 NOTE — Plan of Care (Signed)

## 2023-04-18 ENCOUNTER — Telehealth: Payer: Self-pay

## 2023-04-18 NOTE — Transitions of Care (Post Inpatient/ED Visit) (Signed)
   04/18/2023  Name: Robert Sellers MRN: [id number] DOB: Mar 11, 1980  Today's TOC FU Call Status: Today's TOC FU Call Status:: Unsuccessful Call (1st Attempt) Unsuccessful Call (1st Attempt) Date: 04/18/23  Attempted to reach the patient regarding the most recent Inpatient/ED visit.  Follow Up Plan: Additional outreach attempts will be made to reach the patient to complete the Transitions of Care (Post Inpatient/ED visit) call.   Signature Karena Addison, LPN [facility] Nurse Health Advisor Direct Dial [phone]

## 2023-04-22 ENCOUNTER — Encounter: Payer: Self-pay | Admitting: General Practice

## 2023-04-22 ENCOUNTER — Ambulatory Visit: Admitting: General Practice

## 2023-04-22 ENCOUNTER — Other Ambulatory Visit

## 2023-04-22 ENCOUNTER — Ambulatory Visit
Admission: RE | Admit: 2023-04-22 | Discharge: 2023-04-22 | Disposition: A | Discharge status: Home / Self Care | Source: Ambulatory Visit | Attending: General Practice | Admitting: General Practice

## 2023-04-22 VITALS — BP 104/64 | HR 60 | Temp 98.2°F | Ht 70.0 in | Wt 219.0 lb

## 2023-04-22 DIAGNOSIS — R1084 Generalized abdominal pain: Secondary | ICD-10-CM | POA: Insufficient documentation

## 2023-04-22 DIAGNOSIS — R188 Other ascites: Secondary | ICD-10-CM | POA: Diagnosis not present

## 2023-04-22 DIAGNOSIS — F1010 Alcohol abuse, uncomplicated: Secondary | ICD-10-CM | POA: Insufficient documentation

## 2023-04-22 DIAGNOSIS — Z09 Encounter for follow-up examination after completed treatment for conditions other than malignant neoplasm: Secondary | ICD-10-CM | POA: Insufficient documentation

## 2023-04-22 DIAGNOSIS — K7460 Unspecified cirrhosis of liver: Secondary | ICD-10-CM | POA: Diagnosis not present

## 2023-04-22 DIAGNOSIS — K449 Diaphragmatic hernia without obstruction or gangrene: Secondary | ICD-10-CM | POA: Diagnosis not present

## 2023-04-22 LAB — COMPREHENSIVE METABOLIC PANEL
ALT: 57 U/L — ABNORMAL HIGH (ref 0–53)
AST: 110 U/L — ABNORMAL HIGH (ref 0–37)
Albumin: 2.7 g/dL — ABNORMAL LOW (ref 3.5–5.2)
Alkaline Phosphatase: 163 U/L — ABNORMAL HIGH (ref 39–117)
BUN: 17 mg/dL (ref 6–23)
CO2: 29 meq/L (ref 19–32)
Calcium: 7.9 mg/dL — ABNORMAL LOW (ref 8.4–10.5)
Chloride: 93 meq/L — ABNORMAL LOW (ref 96–112)
Creatinine, Ser: 0.81 mg/dL (ref 0.40–1.50)
GFR: 108.94 mL/min (ref >60.00)
Glucose, Bld: 103 mg/dL — ABNORMAL HIGH (ref 70–99)
Potassium: 4.2 meq/L (ref 3.5–5.1)
Sodium: 127 meq/L — ABNORMAL LOW (ref 135–145)
Total Bilirubin: 3.8 mg/dL — ABNORMAL HIGH (ref 0.2–1.2)
Total Protein: 6.3 g/dL (ref 6.0–8.3)

## 2023-04-22 LAB — CBC
HCT: 30.7 % — ABNORMAL LOW (ref 39.0–52.0)
Hemoglobin: 10.3 g/dL — ABNORMAL LOW (ref 13.0–17.0)
MCHC: 33.7 g/dL (ref 30.0–36.0)
MCV: 97.1 fl (ref 78.0–100.0)
Platelets: 118 10*3/uL — ABNORMAL LOW (ref 150.0–400.0)
RBC: 3.16 Mil/uL — ABNORMAL LOW (ref 4.22–5.81)
RDW: 17.2 % — ABNORMAL HIGH (ref 11.5–15.5)
WBC: 8.6 10*3/uL (ref 4.0–10.5)

## 2023-04-22 LAB — LIPASE: Lipase: 43 U/L (ref 11.0–59.0)

## 2023-04-22 LAB — PROTIME-INR
INR: 2.5 ratio — ABNORMAL HIGH (ref 0.8–1.0)
Prothrombin Time: 25.3 s — ABNORMAL HIGH (ref 9.6–13.1)

## 2023-04-22 MED ORDER — IOHEXOL 300 MG/ML  SOLN
75.0000 mL | Freq: Once | INTRAMUSCULAR | Status: AC | PRN
  Administered 2023-04-22: 75 mL via INTRAVENOUS

## 2023-04-22 NOTE — Assessment & Plan Note (Signed)
 Unclear etiology.  Differentials include intra-abdomen bleeding, pancreatitis, worsening cirrhosis with ascites.  Suspect patient would need paracentesis. The bruising on the abdomen is concerning for a GI bleed. Given his abnormal weight gain of 20 pounds in less than 10 days is worrisome.  Discussed ER precautions with patient. STAT CT abdomen with pelvis.  Stat CBC, CMP, lipase, PT-INR pending.

## 2023-04-22 NOTE — Addendum Note (Signed)
 Addended by: Wendie Simmer B on: 04/22/2023 04:22 PM   Modules accepted: Orders

## 2023-04-22 NOTE — Assessment & Plan Note (Signed)
 Discussed at length with patient.  Reports no use of alcohol since discharge from hospital.  Referral placed for chemical dependency clinic.

## 2023-04-22 NOTE — Transitions of Care (Post Inpatient/ED Visit) (Signed)
   04/22/2023  Name: Robert Sellers MRN: [id number] DOB: 01/13/1981  Today's TOC FU Call Status: Today's TOC FU Call Status:: Unsuccessful Call (2nd Attempt) Unsuccessful Call (1st Attempt) Date: 04/18/23 Unsuccessful Call (2nd Attempt) Date: 04/22/23  Attempted to reach the patient regarding the most recent Inpatient/ED visit.  Follow Up Plan: Additional outreach attempts will be made to reach the patient to complete the Transitions of Care (Post Inpatient/ED visit) call.   Signature Karena Addison, LPN [facility] Nurse Health Advisor Direct Dial [phone]

## 2023-04-22 NOTE — Assessment & Plan Note (Signed)
 Reviewed all labs, notes, imaging from hospitalization

## 2023-04-22 NOTE — Progress Notes (Signed)
 Established Patient Office Visit  Subjective   Patient ID: Robert Sellers, male    DOB: [birth year]  Age: 43 y.o. MRN: [id number]  Chief Complaint  Patient presents with   Hospitalization Follow-up   Pain    Abdomen, lower back and right upper arm from accident/fall last Tuesday morning. Patient also having a lot of fatigue.     HPI  Robert Sellers is a 43 year old male with past medical history of hypertension, hyperlipidemia, alcohol abuse, bladder stones presents today for hospital follow-up.  Patient reports that on 04/10/2023 he went to the urgent care for upper respiratory infection where he was prescribed Z-Pak.  Then on 04/11/2023 he reports that he started vomiting blood and still did not go to the ER.  On 04/12/2023 he passed out on the toilet and felt face forward and that is when his girlfriend called 911.   Patient was hospitalized at [facility] on 04/12/2023 for hematemesis.  Patient reports that he had been drinking a pint of whiskey or vodka a day but his wife reported to the hospitalist that it was more than that.  Patient confirmed that he had been vomiting blood for few days at that point.  He has a history of upper GI bleed in 2024 and had to have a upper endoscopy which showed grade 1 esophageal varices with no intervention done at that time.  In the emergency room his hemoglobin was 7.0, he was started on Protonix and octreotide.  He received 2 units of PRBC.  He had a endoscopy which revealed grade 3 varices in the lower third of the esophagus and 5 bands were successfully placed with incomplete urgent eradication of varices.  On 312 his hemoglobin went up to 7.4.  On 04/14/2023 his hemoglobin dropped to 6.9 and he was given another unit of packed red blood cells.  On 04/15/2023 his hemoglobin remained as 6.9 and he received another unit of packed red blood cells and was also given MiraLAX and lactulose.  On 315 his hemoglobin was 8.2 and had numerous bowel movements with no visible blood.   Patient was asked to continue Protonix twice a day, ciprofloxacin for 1 week, soft diet, low-dose nadolol.  Patient was asked to continue Xifaxan due to elevated ammonia levels and follow-up with gastroenterology within 3 weeks for EGD.  He was given phenobarbital and Ativan during his hospitalization but denies any withdrawals.  His platelet count was 33 and was asked to continue to monitor as outpatient.  He had elevated liver functions with AST of 130 and ALT of 51.  Total bilirubin was 3.6.  Patient was having right shoulder pain due to the fall he had prior to his hospitalization and received Solu-Medrol while he was in the hospital and was switched over to prednisone taper at discharge.  He complained of bilateral wrist pain while in the hospital and had x-rays done which were both negative.  Slight hyponatremia with sodium level of 131 at discharge.   Today he reports that he has not seen any more blood in stool since the day he went to the hospital.  He denies any more hematemesis.  He has not called the gastroenterologist to schedule his follow-up yet.  He also reports receiving platelet transfusions during the hospitalization however it was not noted by the hospitalist and discharge summary.  He reports that he is finished his prednisone, but still has some shoulder pain and wrist pain.  But overall his symptoms have improved.  His concern  is the abdominal pain that he has been having since his discharge.  He reports that he has gained about 30 pounds within this week.  His last weight was 191 pounds on 04/14/2023.  He feels pain all over his abdomen with some intermittent nausea.  He denies any fever, chills, diarrhea, constipation.  He reports that he has not had any alcohol since his discharge from the hospital.  He does have a slight cough which he relates to a questionable upper respiratory infection on 04/10/2023.  He denies any trauma since the fall prior to his hospitalization.  He had a CT scan  on 09/29/2022 which showed fatty infiltration of the liver, hepatic cirrhosis with diffuse upper abdominal varices and splenic enlargement.    Patient Active Problem List   Diagnosis Date Noted   Hospital discharge follow-up 04/22/2023   Generalized abdominal pain 04/22/2023   Alcoholic cirrhosis of liver without ascites (HCC) 04/13/2023   Thrombocytopenia (HCC) 04/13/2023   Hyponatremia 04/13/2023   Overweight (BMI 25.0-29.9) 04/13/2023   Elevated liver function tests 04/13/2023   Wrist pain 04/13/2023   Acute pain of right shoulder 04/13/2023   Hematemesis with nausea 04/12/2023   Secondary esophageal varices without bleeding (HCC) 04/12/2023   Insomnia 03/18/2023   Establishing care with new doctor, encounter for 03/18/2023   History of hypertension 03/18/2023   History of hyperlipidemia 03/18/2023   Acute blood loss anemia 10/01/2022   Alcohol abuse 09/30/2022   Bladder stones 06/04/2019   Past Medical History:  Diagnosis Date   Acute blood loss anemia 10/01/2022   Acute upper GI bleeding 09/29/2022   Anxiety    Clotting disorder (HCC)    Depression    Hyperlipidemia 12/04/2012   Hypertension    Left shoulder pain 08/28/2015   Substance abuse (HCC)    No Known Allergies       04/22/2023    2:38 PM 03/18/2023    2:17 PM 10/21/2016    4:11 PM  Depression screen PHQ 2/9  Decreased Interest 2 0 1  Down, Depressed, Hopeless 2 1 0  PHQ - 2 Score 4 1 1   Altered sleeping 2 2   Tired, decreased energy 2 1   Change in appetite 2 1   Feeling bad or failure about yourself  1 0   Trouble concentrating 1 0   Moving slowly or fidgety/restless 0 0   Suicidal thoughts 0 0   PHQ-9 Score 12 5   Difficult doing work/chores Very difficult Not difficult at all        04/22/2023    2:38 PM 03/18/2023    2:17 PM  GAD 7 : Generalized Anxiety Score  Nervous, Anxious, on Edge 2 0  Control/stop worrying 1 0  Worry too much - different things 1 0  Trouble relaxing 2 1  Restless 1  0  Easily annoyed or irritable 2 1  Afraid - awful might happen 1 0  Total GAD 7 Score 10 2  Anxiety Difficulty Very difficult Not difficult at all      Review of Systems  Constitutional:  Negative for chills and fever.  Respiratory:  Positive for shortness of breath.   Cardiovascular:  Negative for chest pain.  Gastrointestinal:  Positive for abdominal pain. Negative for constipation, diarrhea, heartburn, nausea and vomiting.  Genitourinary:  Negative for dysuria, frequency and urgency.  Neurological:  Negative for dizziness and headaches.  Endo/Heme/Allergies:  Negative for polydipsia.  Psychiatric/Behavioral:  Negative for depression and suicidal ideas. The patient  is not nervous/anxious.       Objective:     BP 104/64 (BP Location: Left Arm, Patient Position: Sitting, Cuff Size: Normal)   Pulse 60   Temp 98.2 F (36.8 C) (Oral)   Ht 5\' 10"  (1.778 m)   Wt 219 lb (99.3 kg)   SpO2 100%   BMI 31.42 kg/m  BP Readings from Last 3 Encounters:  04/22/23 104/64  04/16/23 131/80  04/10/23 133/87   Wt Readings from Last 3 Encounters:  04/22/23 219 lb (99.3 kg)  04/14/23 191 lb 8 oz (86.9 kg)  03/18/23 191 lb (86.6 kg)      Physical Exam Vitals and nursing note reviewed.  Constitutional:      Appearance: He is ill-appearing.  Cardiovascular:     Rate and Rhythm: Normal rate and regular rhythm.     Pulses: Normal pulses.     Heart sounds: Normal heart sounds.  Pulmonary:     Effort: Pulmonary effort is normal.     Breath sounds: Normal breath sounds.  Abdominal:     General: Bowel sounds are normal. There is distension.     Tenderness: There is abdominal tenderness. There is no right CVA tenderness, left CVA tenderness or guarding.     Comments: firm  Skin:    Coloration: Skin is pale.     Findings: Bruising and ecchymosis present. No erythema or rash.          Comments: bruising on right upper quadrant and mid abdomen.   Neurological:     Mental Status: He is  alert and oriented to person, place, and time.  Psychiatric:        Mood and Affect: Mood normal.        Behavior: Behavior normal.        Thought Content: Thought content normal.        Judgment: Judgment normal.      No results found for any visits on 04/22/23.     The 10-year ASCVD risk score (Arnett DK, et al., 2019) is: 0.9%    Assessment & Plan:  Hospital discharge follow-up Assessment & Plan: Reviewed all labs, notes, imaging from hospitalization   Alcohol abuse Assessment & Plan: Discussed at length with patient.  Reports no use of alcohol since discharge from hospital.  Referral placed for chemical dependency clinic.  Orders: -     Ambulatory referral to Chemical Dependency -     CT ABDOMEN PELVIS W CONTRAST; Future  Generalized abdominal pain Assessment & Plan: Unclear etiology.  Differentials include intra-abdomen bleeding, pancreatitis, worsening cirrhosis with ascites.  Suspect patient would need paracentesis. The bruising on the abdomen is concerning for a GI bleed. Given his abnormal weight gain of 20 pounds in less than 10 days is worrisome.  Discussed ER precautions with patient. STAT CT abdomen with pelvis.  Stat CBC, CMP, lipase, PT-INR pending.  Orders: -     CT ABDOMEN PELVIS WO CONTRAST; Future -     CBC -     Comprehensive metabolic panel -     Lipase -     Protime-INR -     CT ABDOMEN PELVIS W CONTRAST; Future     Return in about 1 week (around 04/29/2023) for abdominal pain/bloating.    Modesto Charon, NP

## 2023-04-22 NOTE — Patient Instructions (Addendum)
 Call and schedule an appointment with Dr. Timothy Lasso.   Robert Collins, DO Follow up in 3 week(s).   Specialty: Gastroenterology Why: will need repeat egd for esophageal varices Contact information: [address] [place] [address] [phone]     Address: [address], [place], [place] [address] Hours:  Open ? Closes 5?PM Confirmed by this business 10 weeks ago Phone: [phone]  Start Mucinex twice a day.   Follow up with in one week.  It was a pleasure to see you today!

## 2023-04-23 ENCOUNTER — Encounter: Payer: Self-pay | Admitting: Intensive Care

## 2023-04-23 ENCOUNTER — Emergency Department
Admission: EM | Admit: 2023-04-23 | Discharge: 2023-04-23 | Disposition: A | Discharge status: Home / Self Care | Attending: Emergency Medicine | Admitting: Emergency Medicine

## 2023-04-23 ENCOUNTER — Other Ambulatory Visit: Payer: Self-pay

## 2023-04-23 DIAGNOSIS — R140 Abdominal distension (gaseous): Secondary | ICD-10-CM | POA: Diagnosis not present

## 2023-04-23 DIAGNOSIS — I10 Essential (primary) hypertension: Secondary | ICD-10-CM | POA: Insufficient documentation

## 2023-04-23 DIAGNOSIS — R188 Other ascites: Secondary | ICD-10-CM | POA: Diagnosis not present

## 2023-04-23 NOTE — ED Notes (Signed)
 Patient is resting on stretcher. 2nd bag is full. Dr. Lenard Lance aware.

## 2023-04-23 NOTE — ED Provider Notes (Signed)
 [facility] Provider Note    Event Date/Time   First MD Initiated Contact with Patient 04/23/23 1404     (approximate)  History   Chief Complaint: Abdominal Pain  HPI  Robert Sellers is a 43 y.o. male with a past medical history of anemia, anxiety, hypertension, hyperlipidemia, past alcohol use, presents to the emergency department for weight gain and abdominal distention.  According to the patient and record review I reviewed the patient's discharge summary from 3/15 patient was admitted to the hospital for hematemesis thought to be due to esophageal varices was treated with a banding procedure.  Patient states he had been recovering at home however over the past 1 week he has now been experiencing significant abdominal distention and tightness sensation.  Patient saw his doctor yesterday who checked labs (which are available in our system) and obtained a CT scan.  On the CT patient was called today saying he had a lot of fluid in his abdomen and recommended that he come to the emergency department for treatment.  Patient denies any history of ascites denies any prior paracentesis.  Denies any further bloody vomitus.  Physical Exam   Triage Vital Signs: ED Triage Vitals [04/23/23 1359]  Encounter Vitals Group     BP (!) 125/97     Systolic BP Percentile      Diastolic BP Percentile      Pulse Rate 88     Resp 20     Temp 97.7 F (36.5 C)     Temp Source Oral     SpO2 100 %     Weight 220 lb (99.8 kg)     Height 5\' 10"  (1.778 m)     Head Circumference      Peak Flow      Pain Score 8     Pain Loc      Pain Education      Exclude from Growth Chart     Most recent vital signs: Vitals:   04/23/23 1359  BP: (!) 125/97  Pulse: 88  Resp: 20  Temp: 97.7 F (36.5 C)  SpO2: 100%    General: Awake, no distress.  CV:  Good peripheral perfusion.  Regular rate and rhythm  Resp:  Normal effort.  Equal breath sounds bilaterally.  Abd:  Moderate abdominal  distention dull percussion consistent with ascites.  Tightness throughout but denies any focal pain or tenderness on exam.  ED Results / Procedures / Treatments   EKG  EKG viewed and interpreted by myself shows a normal sinus rhythm at 77 bpm with a narrow QRS, right axis deviation, largely normal intervals with nonspecific ST changes.   MEDICATIONS ORDERED IN ED: Medications - No data to display   IMPRESSION / MDM / ASSESSMENT AND PLAN / ED COURSE  I reviewed the triage vital signs and the nursing notes.  Patient's presentation is most consistent with acute presentation with potential threat to life or bodily function.  Patient presents to the emergency department for abdominal distention and tightness.  CT scan confirms new volume ascites.  I reviewed the patient's lab work from yesterday does show mild INR elevation, H&H is reassuring, platelet count reassuring.  Do not believe patient requires new lab work today given lab work yesterday with no acute symptoms besides continued worsening abdominal tightness and distention.  Patient has what appears to be large volume ascites.  On the weekends unfortunately we do not have IR to perform a therapeutic/large-volume paracentesis.  However given the patient's tightness and abdominal distention I discussed with the patient a bedside small volume paracentesis.  Patient is agreeable to plan of care.  Discussed risks and benefit.  Patient verbally consented.  Consent obtained.  I was able to perform the procedure without issue.  Draining yellow appearing ascitic fluid.  ABDOMINAL PARACENTESIS  Date/Time: 04/23/2023 3:22 PM  Performed by: Minna Antis, MD Authorized by: Minna Antis, MD  Consent: Verbal consent obtained. Written consent obtained. Risks and benefits: risks, benefits and alternatives were discussed Consent given by: patient Patient understanding: patient states understanding of the procedure being performed Patient  consent: the patient's understanding of the procedure matches consent given Procedure consent: procedure consent matches procedure scheduled Relevant documents: relevant documents present and verified Test results: test results available and properly labeled Site marked: the operative site was marked Imaging studies: imaging studies available Required items: required blood products, implants, devices, and special equipment available Patient identity confirmed: verbally with patient and arm band Time out: Immediately prior to procedure a "time out" was called to verify the correct patient, procedure, equipment, support staff and site/side marked as required. Preparation: Patient was prepped and draped in the usual sterile fashion. Local anesthesia used: yes Anesthesia: local infiltration  Anesthesia: Local anesthesia used: yes Local Anesthetic: lidocaine 1% without epinephrine Anesthetic total: 5 mL  Sedation: Patient sedated: no  Patient tolerance: patient tolerated the procedure well with no immediate complications   Removed approximately 3 L of ascitic fluid.  No complications.  Patient is feeling much better.  Will discharge with GI follow-up.   FINAL CLINICAL IMPRESSION(S) / ED DIAGNOSES   Ascites Abdominal distention   Note:  This document was prepared using Dragon voice recognition software and may include unintentional dictation errors.   Minna Antis, MD 04/23/23 [phone]

## 2023-04-23 NOTE — ED Triage Notes (Signed)
 Patient sent to ER to have paracentesis. Reports 40lb weight gain in a week. Admitted last week at [facility].  History esophageal varices

## 2023-04-23 NOTE — Discharge Instructions (Signed)
 Please follow-up with your GI doctor regarding today's ER visit and your abdominal ascites.  Return to the emergency department for any abdominal pain, fever or any other symptom concerning to yourself.

## 2023-04-24 ENCOUNTER — Encounter: Payer: Self-pay | Admitting: General Practice

## 2023-04-25 ENCOUNTER — Other Ambulatory Visit: Payer: Self-pay

## 2023-04-25 ENCOUNTER — Encounter: Payer: Self-pay | Admitting: *Deleted

## 2023-04-25 ENCOUNTER — Encounter: Payer: Self-pay | Admitting: General Practice

## 2023-04-25 ENCOUNTER — Encounter: Payer: Self-pay | Admitting: Emergency Medicine

## 2023-04-25 ENCOUNTER — Other Ambulatory Visit: Payer: Self-pay | Admitting: General Practice

## 2023-04-25 ENCOUNTER — Emergency Department

## 2023-04-25 ENCOUNTER — Emergency Department
Admission: EM | Admit: 2023-04-25 | Discharge: 2023-04-25 | Disposition: A | Discharge status: Home / Self Care | Attending: Emergency Medicine | Admitting: Emergency Medicine

## 2023-04-25 DIAGNOSIS — F1011 Alcohol abuse, in remission: Secondary | ICD-10-CM | POA: Insufficient documentation

## 2023-04-25 DIAGNOSIS — D696 Thrombocytopenia, unspecified: Secondary | ICD-10-CM

## 2023-04-25 DIAGNOSIS — N433 Hydrocele, unspecified: Secondary | ICD-10-CM | POA: Diagnosis not present

## 2023-04-25 DIAGNOSIS — N5089 Other specified disorders of the male genital organs: Secondary | ICD-10-CM | POA: Insufficient documentation

## 2023-04-25 DIAGNOSIS — I10 Essential (primary) hypertension: Secondary | ICD-10-CM | POA: Diagnosis not present

## 2023-04-25 DIAGNOSIS — R7989 Other specified abnormal findings of blood chemistry: Secondary | ICD-10-CM

## 2023-04-25 DIAGNOSIS — K7030 Alcoholic cirrhosis of liver without ascites: Secondary | ICD-10-CM

## 2023-04-25 DIAGNOSIS — R339 Retention of urine, unspecified: Secondary | ICD-10-CM | POA: Diagnosis not present

## 2023-04-25 DIAGNOSIS — I8510 Secondary esophageal varices without bleeding: Secondary | ICD-10-CM

## 2023-04-25 DIAGNOSIS — Z87898 Personal history of other specified conditions: Secondary | ICD-10-CM | POA: Insufficient documentation

## 2023-04-25 DIAGNOSIS — R338 Other retention of urine: Secondary | ICD-10-CM | POA: Insufficient documentation

## 2023-04-25 HISTORY — DX: Other ascites: R18.8

## 2023-04-25 LAB — BASIC METABOLIC PANEL
Anion gap: 5 (ref 5–15)
BUN: 20 mg/dL (ref 6–20)
CO2: 25 mmol/L (ref 22–32)
Calcium: 7.6 mg/dL — ABNORMAL LOW (ref 8.9–10.3)
Chloride: 98 mmol/L (ref 98–111)
Creatinine, Ser: 0.8 mg/dL (ref 0.61–1.24)
GFR, Estimated: >60 mL/min (ref >60)
Glucose, Bld: 100 mg/dL — ABNORMAL HIGH (ref 70–99)
Potassium: 4.2 mmol/L (ref 3.5–5.1)
Sodium: 128 mmol/L — ABNORMAL LOW (ref 135–145)

## 2023-04-25 LAB — URINALYSIS, ROUTINE W REFLEX MICROSCOPIC
Bacteria, UA: NONE SEEN
Bilirubin Urine: NEGATIVE
Glucose, UA: NEGATIVE mg/dL
Ketones, ur: NEGATIVE mg/dL
Leukocytes,Ua: NEGATIVE
Nitrite: NEGATIVE
Protein, ur: NEGATIVE mg/dL
RBC / HPF: >50 RBC/hpf (ref 0–5)
Specific Gravity, Urine: 1.025 (ref 1.005–1.030)
Squamous Epithelial / HPF: 0 /HPF (ref 0–5)
pH: 5 (ref 5.0–8.0)

## 2023-04-25 LAB — CBC WITH DIFFERENTIAL/PLATELET
Abs Immature Granulocytes: 0.05 10*3/uL (ref 0.00–0.07)
Basophils Absolute: 0.1 10*3/uL (ref 0.0–0.1)
Basophils Relative: 1 %
Eosinophils Absolute: 0.1 10*3/uL (ref 0.0–0.5)
Eosinophils Relative: 1 %
HCT: 27.2 % — ABNORMAL LOW (ref 39.0–52.0)
Hemoglobin: 9.1 g/dL — ABNORMAL LOW (ref 13.0–17.0)
Immature Granulocytes: 1 %
Lymphocytes Relative: 15 %
Lymphs Abs: 1.6 10*3/uL (ref 0.7–4.0)
MCH: 31.8 pg (ref 26.0–34.0)
MCHC: 33.5 g/dL (ref 30.0–36.0)
MCV: 95.1 fL (ref 80.0–100.0)
Monocytes Absolute: 1.1 10*3/uL — ABNORMAL HIGH (ref 0.1–1.0)
Monocytes Relative: 10 %
Neutro Abs: 7.8 10*3/uL — ABNORMAL HIGH (ref 1.7–7.7)
Neutrophils Relative %: 72 %
Platelets: 122 10*3/uL — ABNORMAL LOW (ref 150–400)
RBC: 2.86 MIL/uL — ABNORMAL LOW (ref 4.22–5.81)
RDW: 16.3 % — ABNORMAL HIGH (ref 11.5–15.5)
WBC: 10.7 10*3/uL — ABNORMAL HIGH (ref 4.0–10.5)
nRBC: 0 % (ref 0.0–0.2)

## 2023-04-25 MED ORDER — OXYCODONE HCL 5 MG PO TABS
5.0000 mg | ORAL_TABLET | Freq: Once | ORAL | Status: AC
  Administered 2023-04-25: 5 mg via ORAL
  Filled 2023-04-25: qty 1

## 2023-04-25 MED ORDER — IBUPROFEN 600 MG PO TABS
600.0000 mg | ORAL_TABLET | Freq: Once | ORAL | Status: AC
  Administered 2023-04-25: 600 mg via ORAL
  Filled 2023-04-25: qty 1

## 2023-04-25 NOTE — ED Provider Triage Note (Signed)
 Emergency Medicine Provider Triage Evaluation Note  Robert Sellers , a 43 y.o. male  was evaluated in triage.  Pt complains of bilateral scrotal pain and swelling that started this morning. No urinary sx prior, no fever, chills, nausea or vomiting.  Unable to urinate.    Review of Systems  Positive: + scrotal swelling Negative: No fever, chills no urinary sx.  No prior prostate hx.   Physical Exam  BP 124/75 (BP Location: Left Arm)   Pulse 68   Temp 98.2 F (36.8 C) (Oral)   Resp 18   Ht 5\' 10"  (1.778 m)   Wt 97.5 kg   SpO2 100%   BMI 30.85 kg/m  Gen:   Awake, no distress   Resp:  Normal effort  MSK:   Moves extremities without difficulty  Other:  Bilateral scrotal swelling, no penile discharge  Medical Decision Making  Medically screening exam initiated at 1:12 PM.  Appropriate orders placed.  [name] was informed that the remainder of the evaluation will be completed by another provider, this initial triage assessment does not replace that evaluation, and the importance of remaining in the ED until their evaluation is complete.     Tommi Rumps, PA-C 04/25/23 1315

## 2023-04-25 NOTE — Transitions of Care (Post Inpatient/ED Visit) (Signed)
   04/25/2023  Name: Robert Sellers MRN: [id number] DOB: 12/29/1980  Today's TOC FU Call Status: Today's TOC FU Call Status:: Unsuccessful Call (2nd Attempt) Unsuccessful Call (1st Attempt) Date: 04/18/23 Unsuccessful Call (2nd Attempt) Date: 04/22/23  Attempted to reach the patient regarding the most recent Inpatient/ED visit.  Follow Up Plan: No further outreach attempts will be made at this time. We have been unable to contact the patient. Patient has been dismissed Signature  Karena Addison, LPN [facility] [phone]

## 2023-04-25 NOTE — ED Notes (Signed)
 Bladder Scan completed. 

## 2023-04-25 NOTE — ED Notes (Signed)
 See triage notes. Patient c/o scrotal swelling and pain that started today. Patient stated he has pain with urination and has been unable to go today. Denies prostate problems.

## 2023-04-25 NOTE — ED Triage Notes (Signed)
 Patient to ED via POV for scrotal swelling and pain. Started today. States painful urination- has not been able to go today. Denies prostate problems. State he had a paracentesis on Saturday for ascites.

## 2023-04-25 NOTE — ED Notes (Signed)
 Leg bag placed at this time. Instructed patient on how to drain bag. Pt verbalizes understanding of discharge instructions. Opportunity for questioning and answers were provided. Pt discharged from ED to home with friend.

## 2023-04-25 NOTE — ED Provider Notes (Addendum)
 [facility] Provider Note   Event Date/Time   First MD Initiated Contact with Patient 04/25/23 1836     (approximate) History  Groin Swelling  HPI Robert Sellers is a 43 y.o. male with a past medical history of alcoholic cirrhosis with associated ascites, hypertension, and hyperlipidemia who presents complaining of of scrotal swelling with associated throbbing pain.  Patient states that he has known ascites and had paracentesis recently. ROS: Patient currently denies any vision changes, tinnitus, difficulty speaking, facial droop, sore throat, chest pain, shortness of breath, abdominal pain, nausea/vomiting/diarrhea, dysuria, or weakness/numbness/paresthesias in any extremity   Physical Exam  Triage Vital Signs: ED Triage Vitals  Encounter Vitals Group     BP 04/25/23 1305 124/75     Systolic BP Percentile --      Diastolic BP Percentile --      Pulse Rate 04/25/23 1305 68     Resp 04/25/23 1305 18     Temp 04/25/23 1305 98.2 F (36.8 C)     Temp Source 04/25/23 1305 Oral     SpO2 04/25/23 1305 100 %     Weight 04/25/23 1305 215 lb (97.5 kg)     Height 04/25/23 1305 5\' 10"  (1.778 m)     Head Circumference --      Peak Flow --      Pain Score 04/25/23 1310 7     Pain Loc --      Pain Education --      Exclude from Growth Chart --    Most recent vital signs: Vitals:   04/25/23 1305 04/25/23 1724  BP: 124/75 124/74  Pulse: 68 (!) 56  Resp: 18 18  Temp: 98.2 F (36.8 C) 98.2 F (36.8 C)  SpO2: 100% 99%   General: Awake, oriented x4. CV:  Good peripheral perfusion.  Resp:  Normal effort.  Abd:  Distention with fluid wave and nontender to palpation GU:  Normal external circumcised male genitalia with significant scrotal edema without any erythema or significant tenderness to palpation Other:  Middle-aged overweight Caucasian male resting comfortably in no acute distress ED Results / Procedures / Treatments  Labs (all labs ordered are listed, but  only abnormal results are displayed) Labs Reviewed  CBC WITH DIFFERENTIAL/PLATELET - Abnormal; Notable for the following components:      Result Value   WBC 10.7 (*)    RBC 2.86 (*)    Hemoglobin 9.1 (*)    HCT 27.2 (*)    RDW 16.3 (*)    Platelets 122 (*)    Neutro Abs 7.8 (*)    Monocytes Absolute 1.1 (*)    All other components within normal limits  BASIC METABOLIC PANEL - Abnormal; Notable for the following components:   Sodium 128 (*)    Glucose, Bld 100 (*)    Calcium 7.6 (*)    All other components within normal limits  URINALYSIS, ROUTINE W REFLEX MICROSCOPIC   RADIOLOGY ED MD interpretation: Ultrasound of the scrotum interpreted independently and shows extensive bilateral scrotal wall edema with no drainable scrotal wall abscess and otherwise unremarkable exam -Agree with radiology assessment Official radiology report(s): US SCROTUM W/DOPPLER Result Date: 04/25/2023 CLINICAL DATA:  [id number] Scrotal swelling [id number].  Pain. EXAM: SCROTAL ULTRASOUND DOPPLER ULTRASOUND OF THE TESTICLES TECHNIQUE: Complete ultrasound examination of the testicles, epididymis, and other scrotal structures was performed. Color and spectral Doppler ultrasound were also utilized to evaluate blood flow to the testicles. COMPARISON:  None Available. FINDINGS: Right testicle  Measurements: 2.3 x 2.8 x 4.3 cm. No mass or microlithiasis visualized. Left testicle Measurements: 2.3 x 2.5 x 3.8 cm. No mass or microlithiasis visualized. Right epididymis:  Normal in size and appearance. Left epididymis:  Normal in size and appearance. Hydrocele:  There are bilateral physiological hydrocele. Varicocele:  None visualized. Pulsed Doppler interrogation of both testes demonstrates normal low resistance arterial and venous waveforms bilaterally. Note is made of extensive bilateral scrotal wall edema. No drainable scrotal wall abscess seen. IMPRESSION: *Extensive bilateral scrotal wall edema. No drainable scrotal wall abscess.  *Otherwise unremarkable exam. Electronically Signed   By: Jules Schick M.D.   On: 04/25/2023 16:12   PROCEDURES: Critical Care performed: No Procedures MEDICATIONS ORDERED IN ED: Medications  oxyCODONE (Oxy IR/ROXICODONE) immediate release tablet 5 mg (5 mg Oral Given 04/25/23 1317)   IMPRESSION / MDM / ASSESSMENT AND PLAN / ED COURSE  I reviewed the triage vital signs and the nursing notes.                             The patient is on the cardiac monitor to evaluate for evidence of arrhythmia and/or significant heart rate changes. Patient's presentation is most consistent with acute presentation with potential threat to life or bodily function. The patient is suffering from scrotal pain and swelling, but based on the history, exam, and testing, I do not suspect that the patient has testicular torsion, abscess, severe cellulitis, Fournier's gangrene, or other emergent cause.  UA unremarkable US Scrotum: Significant scrotal wall edema Patient's bladder scan showing over 600 cc with patient stating that he was unable to pass urine and has worsening lower abdominal pain.  Therefore, coud catheter was placed and patient referred to urology for further workup Disposition: Plan follow up with primary care doctor for symptom re-check and possible referral to urology. Discussed return precautions at bedside. Discharge.   FINAL CLINICAL IMPRESSION(S) / ED DIAGNOSES   Final diagnoses:  Scrotal edema  History of alcohol abuse  History of ascites   Rx / DC Orders   ED Discharge Orders     None      Note:  This document was prepared using Dragon voice recognition software and may include unintentional dictation errors.   Merwyn Katos, MD 04/25/23 Margretta Ditty    Merwyn Katos, MD 04/25/23 [phone]

## 2023-04-25 NOTE — Discharge Instructions (Addendum)
 Please use supportive underwear for this extra fluid in your scrotum.  If the swelling persists, please follow-up with the listed urologist.  Please also follow-up with gastroenterology to better assess this persistent fluid in your abdomen.

## 2023-04-26 ENCOUNTER — Encounter: Payer: Self-pay | Admitting: General Practice

## 2023-04-29 ENCOUNTER — Encounter: Payer: Self-pay | Admitting: General Practice

## 2023-04-29 ENCOUNTER — Ambulatory Visit: Admitting: General Practice

## 2023-04-29 VITALS — BP 128/72 | HR 77 | Temp 97.9°F | Ht 70.0 in | Wt 230.0 lb

## 2023-04-29 DIAGNOSIS — R7989 Other specified abnormal findings of blood chemistry: Secondary | ICD-10-CM

## 2023-04-29 DIAGNOSIS — K7030 Alcoholic cirrhosis of liver without ascites: Secondary | ICD-10-CM | POA: Diagnosis not present

## 2023-04-29 DIAGNOSIS — R601 Generalized edema: Secondary | ICD-10-CM | POA: Insufficient documentation

## 2023-04-29 DIAGNOSIS — F419 Anxiety disorder, unspecified: Secondary | ICD-10-CM | POA: Insufficient documentation

## 2023-04-29 DIAGNOSIS — R1084 Generalized abdominal pain: Secondary | ICD-10-CM

## 2023-04-29 MED ORDER — SERTRALINE HCL 50 MG PO TABS: 50.0000 mg | ORAL_TABLET | Freq: Every day | ORAL | 0 refills | Status: DC

## 2023-04-29 MED ORDER — SPIRONOLACTONE 25 MG PO TABS: 25.0000 mg | ORAL_TABLET | Freq: Every day | ORAL | 0 refills | Status: DC

## 2023-04-29 NOTE — Assessment & Plan Note (Signed)
 Worsening.  Has gained 15 lbs since last Friday.   He will call GI on Monday.

## 2023-04-29 NOTE — Assessment & Plan Note (Signed)
 Uncontrolled.  Discussed treatment options.   Start sertraline 25 mg once daily for one week and the increase to 50 mg there after.   Follow up in 4 weeks regarding anxiety.

## 2023-04-29 NOTE — Patient Instructions (Addendum)
 Start sertraline 25 mg once daily and then increase 50 mg thereafter.   Start Spironolactone 25 mg once daily. Please keep an eye on your blood pressure.   Keep scrotum elevated.   You will either be contacted via phone regarding your referral to liver clinic , or you may receive a letter on your MyChart portal from our referral team with instructions for scheduling an appointment. Please let us know if you have not been contacted by anyone within two weeks.  Schedule appt with GI.   Follow up with me in one week.

## 2023-04-29 NOTE — Assessment & Plan Note (Addendum)
 Fluid build up noted in abdomen, legs, scrotum and thighs.   Severe scrotum swelling.  Handicap placcard form given for 6 months given the swelling in bilateral lower extremities, abdomen and scrotum.   Start spironolactone 25 mg once daily.  Urgent referral placed for liver clinic.  Patient has an appointment with urology in two weeks to discuss if the catheter can be removed.

## 2023-04-29 NOTE — Assessment & Plan Note (Signed)
 Worsening.  He has not had any alcohol since his discharge from the hospital 3 weeks ago.   Has gained 15 lbs since last Friday.  Referral placed for liver clinic.  Discussed doing another paracentesis.  ER precautions given.   Follow up in 4 weeks.

## 2023-04-29 NOTE — Progress Notes (Signed)
 Established Patient Office Visit  Subjective   Patient ID: Robert Sellers, male    DOB: 12/26/1980  Age: 43 y.o. MRN: [id number]  Chief Complaint  Patient presents with   Abdominal Pain    And swelling    HPI  Robert Sellers is a 43 year old male with past medical history of alcoholic cirrhosis of liver, esophageal varices, HLD, obesity, alcohol abuse, presents today for a follow up.   Patient was evaluated on 04/22/2023 for hospital follow-up.  Patient had gained 30 pounds in less than 10 days.  Patient was sent for stat CT abdomen pelvis which showed moderate amount of fluid in the abdomen.  Patient presented to the ER on 04/23/2023 had a paracentesis done and had 3 L removed.  Patient sent a message on 04/25/2023 regarding scrotum swelling which was mildly uncomfortable. He went back to the ER on 04/25/23 for scrotal edema and he was found to have difficulty urinating. Upon bladder scan he had 600 cc in his bladder; coude catheter was placed. On 04/26/23, he sent a mychart message stating the the edema has gotten worsen and requested diuretic, patient was asked to follow up in the office.   Today he reports that the swelling has worsen. Scrotal swelling, abdominal swelling and bilateral lower extremity swelling is present. He still has the catheter present. He still scheduled to see urology in two weeks.   He has been seen GI since the doctor is not available. He will see him next week.   He reports that he is very uncomfortable and would ike to get relief. He has not had any alcohol in the last few weeks. Due the pain and swelling, he would like a handicap placcard.   He denies any chest pain, headache or blurred vision. He does get short of breath after walking short periods of time due to his ascites.  He has been feeling overwhelmed by all of this. He worries all the time. He has been having trouble falling asleep. Denies SI/HI.  Patient Active Problem List   Diagnosis Date Noted    Anasarca 04/29/2023   Anxiety 04/29/2023   Hospital discharge follow-up 04/22/2023   Generalized abdominal pain 04/22/2023   Alcoholic cirrhosis of liver without ascites (HCC) 04/13/2023   Thrombocytopenia (HCC) 04/13/2023   Hyponatremia 04/13/2023   Overweight (BMI 25.0-29.9) 04/13/2023   Elevated liver function tests 04/13/2023   Wrist pain 04/13/2023   Acute pain of right shoulder 04/13/2023   Hematemesis with nausea 04/12/2023   Secondary esophageal varices without bleeding (HCC) 04/12/2023   Insomnia 03/18/2023   History of hypertension 03/18/2023   History of hyperlipidemia 03/18/2023   Acute blood loss anemia 10/01/2022   Alcohol abuse 09/30/2022   Bladder stones 06/04/2019   Past Medical History:  Diagnosis Date   Acute blood loss anemia 10/01/2022   Acute upper GI bleeding 09/29/2022   Anxiety    Ascites    Clotting disorder (HCC)    Depression    Hyperlipidemia 12/04/2012   Hypertension    Left shoulder pain 08/28/2015   Substance abuse (HCC)    Past Surgical History:  Procedure Laterality Date   ESOPHAGOGASTRODUODENOSCOPY N/A 04/12/2023   Procedure: EGD (ESOPHAGOGASTRODUODENOSCOPY);  Surgeon: Midge Minium, MD;  Location: [facility] ENDOSCOPY;  Service: Endoscopy;  Laterality: N/A;   ESOPHAGOGASTRODUODENOSCOPY (EGD) WITH PROPOFOL N/A 09/30/2022   Procedure: ESOPHAGOGASTRODUODENOSCOPY (EGD) WITH PROPOFOL;  Surgeon: Toledo, Boykin Nearing, MD;  Location: [facility] ENDOSCOPY;  Service: Gastroenterology;  Laterality: N/A;   GASTRIC  VARICES BANDING  04/12/2023   Procedure: BAND LIGATION, GASTRIC VARICES;  Surgeon: Midge Minium, MD;  Location: [facility] ENDOSCOPY;  Service: Endoscopy;;   KNEE SURGERY     left shoulder surgery Left 2024   [facility]   No Known Allergies       04/29/2023    3:24 PM 04/22/2023    2:38 PM 03/18/2023    2:17 PM  Depression screen PHQ 2/9  Decreased Interest 2 2 0  Down, Depressed, Hopeless 2 2 1   PHQ - 2 Score 4 4 1   Altered sleeping 2 2 2   Tired, decreased  energy 2 2 1   Change in appetite 2 2 1   Feeling bad or failure about yourself  1 1 0  Trouble concentrating 2 1 0  Moving slowly or fidgety/restless 0 0 0  Suicidal thoughts 0 0 0  PHQ-9 Score 13 12 5   Difficult doing work/chores Very difficult Very difficult Not difficult at all       04/29/2023    3:24 PM 04/22/2023    2:38 PM 03/18/2023    2:17 PM  GAD 7 : Generalized Anxiety Score  Nervous, Anxious, on Edge 2 2 0  Control/stop worrying 1 1 0  Worry too much - different things 1 1 0  Trouble relaxing 2 2 1   Restless 1 1 0  Easily annoyed or irritable 2 2 1   Afraid - awful might happen 0 1 0  Total GAD 7 Score 9 10 2   Anxiety Difficulty Very difficult Very difficult Not difficult at all      Review of Systems  Constitutional:  Negative for chills and fever.  Respiratory:  Negative for shortness of breath.   Cardiovascular:  Negative for chest pain.  Gastrointestinal:  Positive for abdominal pain. Negative for constipation, diarrhea, heartburn, nausea and vomiting.       Ascites  Genitourinary:        Scrotum swelling and pain  Neurological:  Negative for dizziness and headaches.  Endo/Heme/Allergies:  Negative for polydipsia.  Psychiatric/Behavioral:  Negative for depression and suicidal ideas. The patient is not nervous/anxious.       Objective:     BP 128/72 (BP Location: Left Arm, Patient Position: Sitting, Cuff Size: Normal)   Pulse 77   Temp 97.9 F (36.6 C) (Oral)   Ht 5\' 10"  (1.778 m)   Wt 230 lb (104.3 kg)   SpO2 98%   BMI 33.00 kg/m  BP Readings from Last 3 Encounters:  04/29/23 128/72  04/25/23 104/70  04/23/23 115/76   Wt Readings from Last 3 Encounters:  04/29/23 230 lb (104.3 kg)  04/25/23 215 lb (97.5 kg)  04/23/23 220 lb (99.8 kg)      Physical Exam Vitals and nursing note reviewed. Exam conducted with a chaperone present.  Constitutional:      Appearance: Normal appearance.  Cardiovascular:     Rate and Rhythm: Normal rate and  regular rhythm.     Pulses: Normal pulses.     Heart sounds: Normal heart sounds.  Pulmonary:     Effort: Pulmonary effort is normal.     Breath sounds: Normal breath sounds.  Abdominal:     General: Bowel sounds are normal. There is distension.     Comments: Ascites, firm abdomen  Genitourinary:    Testes:        Right: Swelling present.        Left: Swelling present.     Comments: Coude catherter present,  Severe scrotum swelling  present on exam Neurological:     Mental Status: He is alert and oriented to person, place, and time.     Coordination: Coordination normal.  Psychiatric:        Mood and Affect: Mood normal.        Behavior: Behavior normal.        Thought Content: Thought content normal.        Judgment: Judgment normal.      No results found for any visits on 04/29/23.     The 10-year ASCVD risk score (Arnett DK, et al., 2019) is: 1.4%    Assessment & Plan:  Anasarca Assessment & Plan: Fluid build up noted in abdomen, legs, scrotum and thighs.   Severe scrotum swelling.  Handicap placcard form given for 6 months given the swelling in bilateral lower extremities, abdomen and scrotum.   Start spironolactone 25 mg once daily.  Urgent referral placed for liver clinic.  Patient has an appointment with urology in two weeks to discuss if the catheter can be removed.  Orders: -     Amb Referral to Hepatology -     Spironolactone; Take 1 tablet (25 mg total) by mouth daily.  Dispense: 90 tablet; Refill: 0  Alcoholic cirrhosis of liver without ascites (HCC) Assessment & Plan: Worsening.  He has not had any alcohol since his discharge from the hospital 3 weeks ago.   Has gained 15 lbs since last Friday.  Referral placed for liver clinic.  Discussed doing another paracentesis.  ER precautions given.   Follow up in 4 weeks.  Orders: -     Amb Referral to Hepatology -     Spironolactone; Take 1 tablet (25 mg total) by mouth daily.  Dispense: 90 tablet;  Refill: 0  Elevated liver function tests -     Amb Referral to Hepatology  Anxiety Assessment & Plan: Uncontrolled.  Discussed treatment options.   Start sertraline 25 mg once daily for one week and the increase to 50 mg there after.   Follow up in 4 weeks regarding anxiety.  Orders: -     Sertraline HCl; Take 1 tablet (50 mg total) by mouth daily.  Dispense: 30 tablet; Refill: 0  Generalized abdominal pain Assessment & Plan: Worsening.  Has gained 15 lbs since last Friday.   He will call GI on Monday.    Return in about 4 weeks (around 05/27/2023) for anxiety.    Robert Charon, NP

## 2023-04-30 DIAGNOSIS — F1020 Alcohol dependence, uncomplicated: Secondary | ICD-10-CM | POA: Diagnosis not present

## 2023-04-30 DIAGNOSIS — K7290 Hepatic failure, unspecified without coma: Secondary | ICD-10-CM | POA: Diagnosis not present

## 2023-04-30 DIAGNOSIS — R188 Other ascites: Secondary | ICD-10-CM | POA: Diagnosis not present

## 2023-04-30 DIAGNOSIS — J9811 Atelectasis: Secondary | ICD-10-CM | POA: Diagnosis not present

## 2023-04-30 DIAGNOSIS — R600 Localized edema: Secondary | ICD-10-CM | POA: Diagnosis not present

## 2023-04-30 DIAGNOSIS — E8770 Fluid overload, unspecified: Secondary | ICD-10-CM | POA: Diagnosis not present

## 2023-04-30 DIAGNOSIS — N5089 Other specified disorders of the male genital organs: Secondary | ICD-10-CM | POA: Diagnosis not present

## 2023-04-30 DIAGNOSIS — R601 Generalized edema: Secondary | ICD-10-CM | POA: Diagnosis not present

## 2023-04-30 DIAGNOSIS — Z6832 Body mass index (BMI) 32.0-32.9, adult: Secondary | ICD-10-CM | POA: Diagnosis not present

## 2023-04-30 DIAGNOSIS — K7031 Alcoholic cirrhosis of liver with ascites: Secondary | ICD-10-CM | POA: Diagnosis not present

## 2023-04-30 DIAGNOSIS — N50819 Testicular pain, unspecified: Secondary | ICD-10-CM | POA: Diagnosis not present

## 2023-04-30 DIAGNOSIS — K766 Portal hypertension: Secondary | ICD-10-CM | POA: Diagnosis not present

## 2023-04-30 DIAGNOSIS — R161 Splenomegaly, not elsewhere classified: Secondary | ICD-10-CM | POA: Diagnosis not present

## 2023-04-30 DIAGNOSIS — Z9889 Other specified postprocedural states: Secondary | ICD-10-CM | POA: Diagnosis not present

## 2023-04-30 DIAGNOSIS — I8511 Secondary esophageal varices with bleeding: Secondary | ICD-10-CM | POA: Diagnosis not present

## 2023-04-30 DIAGNOSIS — R9389 Abnormal findings on diagnostic imaging of other specified body structures: Secondary | ICD-10-CM | POA: Diagnosis not present

## 2023-04-30 DIAGNOSIS — E669 Obesity, unspecified: Secondary | ICD-10-CM | POA: Diagnosis not present

## 2023-04-30 DIAGNOSIS — E860 Dehydration: Secondary | ICD-10-CM | POA: Diagnosis not present

## 2023-04-30 DIAGNOSIS — I10 Essential (primary) hypertension: Secondary | ICD-10-CM | POA: Diagnosis not present

## 2023-04-30 DIAGNOSIS — K754 Autoimmune hepatitis: Secondary | ICD-10-CM | POA: Diagnosis not present

## 2023-04-30 DIAGNOSIS — E871 Hypo-osmolality and hyponatremia: Secondary | ICD-10-CM | POA: Diagnosis not present

## 2023-04-30 DIAGNOSIS — N5082 Scrotal pain: Secondary | ICD-10-CM | POA: Diagnosis not present

## 2023-04-30 DIAGNOSIS — D649 Anemia, unspecified: Secondary | ICD-10-CM | POA: Diagnosis not present

## 2023-04-30 DIAGNOSIS — Z1152 Encounter for screening for COVID-19: Secondary | ICD-10-CM | POA: Diagnosis not present

## 2023-04-30 DIAGNOSIS — Z79899 Other long term (current) drug therapy: Secondary | ICD-10-CM | POA: Diagnosis not present

## 2023-04-30 DIAGNOSIS — I2510 Atherosclerotic heart disease of native coronary artery without angina pectoris: Secondary | ICD-10-CM | POA: Diagnosis not present

## 2023-04-30 DIAGNOSIS — R918 Other nonspecific abnormal finding of lung field: Secondary | ICD-10-CM | POA: Diagnosis not present

## 2023-04-30 DIAGNOSIS — Z792 Long term (current) use of antibiotics: Secondary | ICD-10-CM | POA: Diagnosis not present

## 2023-04-30 DIAGNOSIS — J189 Pneumonia, unspecified organism: Secondary | ICD-10-CM | POA: Diagnosis not present

## 2023-04-30 DIAGNOSIS — F1090 Alcohol use, unspecified, uncomplicated: Secondary | ICD-10-CM | POA: Diagnosis not present

## 2023-04-30 DIAGNOSIS — D509 Iron deficiency anemia, unspecified: Secondary | ICD-10-CM | POA: Diagnosis not present

## 2023-04-30 DIAGNOSIS — R509 Fever, unspecified: Secondary | ICD-10-CM | POA: Diagnosis not present

## 2023-04-30 DIAGNOSIS — R338 Other retention of urine: Secondary | ICD-10-CM | POA: Diagnosis not present

## 2023-04-30 DIAGNOSIS — R609 Edema, unspecified: Secondary | ICD-10-CM | POA: Diagnosis not present

## 2023-04-30 DIAGNOSIS — E785 Hyperlipidemia, unspecified: Secondary | ICD-10-CM | POA: Diagnosis not present

## 2023-04-30 DIAGNOSIS — D696 Thrombocytopenia, unspecified: Secondary | ICD-10-CM | POA: Diagnosis not present

## 2023-04-30 DIAGNOSIS — K7460 Unspecified cirrhosis of liver: Secondary | ICD-10-CM | POA: Diagnosis not present

## 2023-04-30 DIAGNOSIS — R109 Unspecified abdominal pain: Secondary | ICD-10-CM | POA: Diagnosis not present

## 2023-04-30 DIAGNOSIS — R0989 Other specified symptoms and signs involving the circulatory and respiratory systems: Secondary | ICD-10-CM | POA: Diagnosis not present

## 2023-05-09 ENCOUNTER — Encounter: Payer: Self-pay | Admitting: General Practice

## 2023-05-09 ENCOUNTER — Ambulatory Visit: Admitting: General Practice

## 2023-05-09 ENCOUNTER — Telehealth: Payer: Self-pay

## 2023-05-09 VITALS — BP 122/68 | HR 108 | Temp 98.7°F | Ht 70.0 in | Wt 230.0 lb

## 2023-05-09 DIAGNOSIS — Z09 Encounter for follow-up examination after completed treatment for conditions other than malignant neoplasm: Secondary | ICD-10-CM

## 2023-05-09 DIAGNOSIS — F1010 Alcohol abuse, uncomplicated: Secondary | ICD-10-CM

## 2023-05-09 DIAGNOSIS — K7030 Alcoholic cirrhosis of liver without ascites: Secondary | ICD-10-CM | POA: Diagnosis not present

## 2023-05-09 DIAGNOSIS — R601 Generalized edema: Secondary | ICD-10-CM | POA: Diagnosis not present

## 2023-05-09 DIAGNOSIS — Z8679 Personal history of other diseases of the circulatory system: Secondary | ICD-10-CM

## 2023-05-09 DIAGNOSIS — K7031 Alcoholic cirrhosis of liver with ascites: Secondary | ICD-10-CM | POA: Diagnosis not present

## 2023-05-09 DIAGNOSIS — D62 Acute posthemorrhagic anemia: Secondary | ICD-10-CM

## 2023-05-09 DIAGNOSIS — F419 Anxiety disorder, unspecified: Secondary | ICD-10-CM

## 2023-05-09 LAB — CBC
HCT: 25.9 % — ABNORMAL LOW (ref 39.0–52.0)
Hemoglobin: 8.6 g/dL — ABNORMAL LOW (ref 13.0–17.0)
MCHC: 33.1 g/dL (ref 30.0–36.0)
MCV: 93.8 fl (ref 78.0–100.0)
Platelets: 60 10*3/uL — ABNORMAL LOW (ref 150.0–400.0)
RBC: 2.76 Mil/uL — ABNORMAL LOW (ref 4.22–5.81)
RDW: 16.6 % — ABNORMAL HIGH (ref 11.5–15.5)
WBC: 3.2 10*3/uL — ABNORMAL LOW (ref 4.0–10.5)

## 2023-05-09 NOTE — Assessment & Plan Note (Signed)
 Uncontrolled.  Patient reports it had improved at discharge from [facility] but it is worsening again.   Referral for GI and liver clinic is pending at multiple places.  He is frustrated due to not being able to get an appointment.  Personally talked to the referral department regarding the liver clinic referral, notes are being reviewed by the clinic in hopes for patient to get an appointment soon.   Will order paracentesis for the end of the week.  Strict ER precautions given and he verbalizes understanding.   Follow up in 3 weeks.

## 2023-05-09 NOTE — Progress Notes (Signed)
 Established Patient Office Visit  Subjective   Patient ID: Robert Sellers, male    DOB: 1980/05/10  Age: 43 y.o. MRN: [id number]  Chief Complaint  Patient presents with   Hospitalization Follow-up    Swelling was getting better but now starting to build back up.    HPI  Robert Sellers is a 43 year old male with past medical history of alcohol abuse, secondary esophageal varices, alcoholic cirrhosis of liver with ascites, acute blood loss anemia, HTN, HLD, anasarca, anxiety presents today for a hospital follow up.   Hospital follow up: was admitted at [facility] on 04/30/23 to 05/05/23 ascites due to alcoholic cirrhosis.  During his hospitalization he was treated with ceftriaxone empiric antibiotic therapy due to a possible pneumonia.  He had severe scrotal edema and had a scrotal ultrasound done which was nonspecific.  No cellulitis on exam.  History of cirrhosis C/B G3 EV and ascites.  Previously he was followed with [facility] hepatology but was seen last in July 2022.  He reported that he has not had any alcohol in last 4 weeks.  But previously he was drinking up to 750 mL liquor daily for 20 years.  He underwent LVP with MED M on 3/30 with 3.5 L off.  He has been diuresing well with Lasix.  At discharge he was asked to continue Lasix 40 mg once daily, Coreg 6.25 twice daily, spironolactone 100 mg once daily, rifaximin 550 once daily and continue thiamine 100 mg daily.  Has hemoglobin remained greater than 7 during his hospitalization and he did not have to receive any packed red blood cells or platelets.  He was seen by physical therapy and Occupational Therapy while at the hospital.  He was seen by urology to remove the coud catheter.  Today he reports that his swelling has decreased and was getting better since discharge however and feels that it is slowly starting to return.  He is not having any trouble urinating.  He is scheduled to see urology on the 14th.  He is still having concerns regarding referrals  for GI and liver clinic.  Reports that he has not heard anything from either offices.   He reports that he still has not had any alcohol for the past 4 weeks and denies any withdrawal symptoms.  He has not started on the medications that he was discharged on from the hospital due to unavailability at the pharmacy.  He reports to see if the medications are available after his appointment today.  He also plans to call around to see if other GI offices will see him sooner.   He still having some shortness of breath as the fluid continues to increase in the abdomen area but he denies any chest pain, fever, nausea, diarrhea, constipation, vomiting or abdominal pain.  He would like to see if he could have another paracentesis done later this week.  Patient Active Problem List   Diagnosis Date Noted   Anasarca 04/29/2023   Anxiety 04/29/2023   Hospital discharge follow-up 04/22/2023   Generalized abdominal pain 04/22/2023   Alcoholic cirrhosis of liver with ascites (HCC) 04/13/2023   Thrombocytopenia (HCC) 04/13/2023   Hyponatremia 04/13/2023   Overweight (BMI 25.0-29.9) 04/13/2023   Elevated liver function tests 04/13/2023   Wrist pain 04/13/2023   Acute pain of right shoulder 04/13/2023   Hematemesis with nausea 04/12/2023   Secondary esophageal varices without bleeding (HCC) 04/12/2023   Insomnia 03/18/2023   History of hypertension 03/18/2023   History  of hyperlipidemia 03/18/2023   Acute blood loss anemia 10/01/2022   Alcohol abuse 09/30/2022   Bladder stones 06/04/2019   Past Medical History:  Diagnosis Date   Acute blood loss anemia 10/01/2022   Acute upper GI bleeding 09/29/2022   Anxiety    Ascites    Clotting disorder (HCC)    Depression    Hyperlipidemia 12/04/2012   Hypertension    Left shoulder pain 08/28/2015   Substance abuse [facility])    Past Surgical History:  Procedure Laterality Date   ESOPHAGOGASTRODUODENOSCOPY N/A 04/12/2023   Procedure: EGD  (ESOPHAGOGASTRODUODENOSCOPY);  Surgeon: Midge Minium, MD;  Location: [facility] ENDOSCOPY;  Service: Endoscopy;  Laterality: N/A;   ESOPHAGOGASTRODUODENOSCOPY (EGD) WITH PROPOFOL N/A 09/30/2022   Procedure: ESOPHAGOGASTRODUODENOSCOPY (EGD) WITH PROPOFOL;  Surgeon: Toledo, Boykin Nearing, MD;  Location: [facility] ENDOSCOPY;  Service: Gastroenterology;  Laterality: N/A;   GASTRIC VARICES BANDING  04/12/2023   Procedure: BAND LIGATION, GASTRIC VARICES;  Surgeon: Midge Minium, MD;  Location: [facility] ENDOSCOPY;  Service: Endoscopy;;   KNEE SURGERY     left shoulder surgery Left 2024   [facility]   No Known Allergies       05/09/2023   10:39 AM 04/29/2023    3:24 PM 04/22/2023    2:38 PM  Depression screen PHQ 2/9  Decreased Interest 2 2 2   Down, Depressed, Hopeless 2 2 2   PHQ - 2 Score 4 4 4   Altered sleeping 2 2 2   Tired, decreased energy 2 2 2   Change in appetite 2 2 2   Feeling bad or failure about yourself  1 1 1   Trouble concentrating 2 2 1   Moving slowly or fidgety/restless 0 0 0  Suicidal thoughts 0 0 0  PHQ-9 Score 13 13 12   Difficult doing work/chores Very difficult Very difficult Very difficult       05/09/2023   10:39 AM 04/29/2023    3:24 PM 04/22/2023    2:38 PM 03/18/2023    2:17 PM  GAD 7 : Generalized Anxiety Score  Nervous, Anxious, on Edge 2 2 2  0  Control/stop worrying 1 1 1  0  Worry too much - different things 1 1 1  0  Trouble relaxing 2 2 2 1   Restless 1 1 1  0  Easily annoyed or irritable 2 2 2 1   Afraid - awful might happen 0 0 1 0  Total GAD 7 Score 9 9 10 2   Anxiety Difficulty Very difficult Very difficult Very difficult Not difficult at all      Review of Systems  Constitutional:  Negative for chills and fever.  Respiratory:  Negative for shortness of breath.   Cardiovascular:  Positive for leg swelling. Negative for chest pain.  Gastrointestinal:  Negative for abdominal pain, constipation, diarrhea, heartburn, nausea and vomiting.  Genitourinary:  Negative for dysuria,  frequency and urgency.       Scrotal swelling.  Neurological:  Negative for dizziness and headaches.  Endo/Heme/Allergies:  Negative for polydipsia.  Psychiatric/Behavioral:  Negative for depression and suicidal ideas. The patient is not nervous/anxious.       Objective:     BP 122/68 (BP Location: Left Arm, Patient Position: Sitting, Cuff Size: Normal)   Pulse (!) 108   Temp 98.7 F (37.1 C) (Oral)   Ht 5\' 10"  (1.778 m)   Wt 230 lb (104.3 kg)   SpO2 98%   BMI 33.00 kg/m  BP Readings from Last 3 Encounters:  05/09/23 122/68  04/29/23 128/72  04/25/23 104/70   Wt Readings  from Last 3 Encounters:  05/09/23 230 lb (104.3 kg)  04/29/23 230 lb (104.3 kg)  04/25/23 215 lb (97.5 kg)      Physical Exam Vitals and nursing note reviewed.  Constitutional:      Appearance: Normal appearance.  Cardiovascular:     Rate and Rhythm: Normal rate and regular rhythm.     Pulses: Normal pulses.     Heart sounds: Normal heart sounds.  Pulmonary:     Effort: Pulmonary effort is normal.     Breath sounds: Normal breath sounds.  Abdominal:     General: Bowel sounds are decreased. There is distension.     Tenderness: There is no abdominal tenderness. There is no guarding.     Comments: Firm, ascites   Musculoskeletal:     Right lower leg: Edema present.     Left lower leg: Edema present.  Skin:    General: Skin is warm.     Coloration: Skin is jaundiced.  Neurological:     Mental Status: He is alert and oriented to person, place, and time.  Psychiatric:        Mood and Affect: Mood normal.        Behavior: Behavior normal.        Thought Content: Thought content normal.        Judgment: Judgment normal.      Results for orders placed or performed in visit on 05/09/23  CBC  Result Value Ref Range   WBC 3.2 (L) 4.0 - 10.5 K/uL   RBC 2.76 (L) 4.22 - 5.81 Mil/uL   Platelets 60.0 (L) 150.0 - 400.0 K/uL   Hemoglobin 8.6 Repeated and verified X2. (L) 13.0 - 17.0 g/dL   HCT 16.1  Repeated and verified X2. (L) 39.0 - 52.0 %   MCV 93.8 78.0 - 100.0 fl   MCHC 33.1 30.0 - 36.0 g/dL   RDW 09.6 (H) 04.5 - 40.9 %       The ASCVD Risk score (Arnett DK, et al., 2019) failed to calculate for the following reasons:   The valid HDL cholesterol range is 20 to 100 mg/dL   The valid total cholesterol range is 130 to 320 mg/dL    Assessment & Plan:  Hospital discharge follow-up Assessment & Plan: Reviewed labs, notes and imaging from [facility] hospitalization.  Orders: -     CBC  Alcoholic cirrhosis of liver without ascites (HCC) Assessment & Plan: Uncontrolled.  Patient reports it had improved at discharge from [facility] but it is worsening again.   Referral for GI and liver clinic is pending at multiple places.  He is frustrated due to not being able to get an appointment.  Personally talked to the referral department regarding the liver clinic referral, notes are being reviewed by the clinic in hopes for patient to get an appointment soon.   Will order paracentesis for the end of the week.  Strict ER precautions given and he verbalizes understanding.   Follow up in 3 weeks.  Orders: -     CBC  Anasarca Assessment & Plan: Slightly improved.  Reports scrotal edema has decreased as well.   Continue Lasix 40 mg once daily, Spironolactone 100 mg once daily.   Referral pending with liver clinic.  Orders: -     CBC  Acute blood loss anemia Assessment & Plan: Hemoglobin at discharge was 8.   Reviewed hospital notes.   CBC pending.   Will place transfusion orders if hemoglobin is below 7.  Orders: -     CBC  Anxiety Assessment & Plan: Has not started sertraline due to the recent admission.   Reports that he will start once he has all of his referrals in place for GI and liver specialist.   Denies SI/HI.   Alcohol abuse Assessment & Plan: Improving.  Continue Campral 333 mg TID with meals, thiamine 100 mg once daily and folic acid. No alcohol for the  past four weeks.   Has an appt with [facility] clinic.   History of hypertension Assessment & Plan: Controlled.   Continue Coreg 6.25 mg BID.    Alcoholic cirrhosis of liver with ascites (HCC) Assessment & Plan: Uncontrolled.  Patient reports it had improved at discharge from [facility] but it is worsening again.   Referral for GI and liver clinic is pending at multiple places.  He is frustrated due to not being able to get an appointment.  Personally talked to the referral department regarding the liver clinic referral, notes are being reviewed by the clinic in hopes for patient to get an appointment soon.   Will order paracentesis for the end of the week.  Strict ER precautions given and he verbalizes understanding.   Follow up in 3 weeks.  Orders: -     CBC    Return in about 3 weeks (around 05/30/2023) for follow up on liver.    [place] Charon, NP

## 2023-05-09 NOTE — Assessment & Plan Note (Signed)
 Reviewed labs, notes and imaging from [facility] hospitalization.

## 2023-05-09 NOTE — Assessment & Plan Note (Deleted)
 Cbc pending

## 2023-05-09 NOTE — Transitions of Care (Post Inpatient/ED Visit) (Unsigned)
   05/09/2023  Name: Robert Sellers MRN: [id number] DOB: [birth year]  Today's TOC FU Call Status: Today's TOC FU Call Status:: Unsuccessful Call (1st Attempt) Unsuccessful Call (1st Attempt) Date: 05/09/23  Attempted to reach the patient regarding the most recent Inpatient/ED visit.  Follow Up Plan: Additional outreach attempts will be made to reach the patient to complete the Transitions of Care (Post Inpatient/ED visit) call.   Signature Karena Addison, LPN [facility] Nurse Health Advisor Direct Dial [phone]

## 2023-05-09 NOTE — Assessment & Plan Note (Signed)
 Has not started sertraline due to the recent admission.   Reports that he will start once he has all of his referrals in place for GI and liver specialist.   Denies SI/HI.

## 2023-05-09 NOTE — Assessment & Plan Note (Signed)
 Controlled.   Continue Coreg 6.25 mg BID.

## 2023-05-09 NOTE — Assessment & Plan Note (Addendum)
 Slightly improved.  Reports scrotal edema has decreased as well.   Continue Lasix 40 mg once daily, Spironolactone 100 mg once daily.   Referral pending with liver clinic.

## 2023-05-09 NOTE — Assessment & Plan Note (Signed)
 Hemoglobin at discharge was 8.   Reviewed hospital notes.   CBC pending.   Will place transfusion orders if hemoglobin is below 7.

## 2023-05-09 NOTE — Assessment & Plan Note (Addendum)
 Improving.  Continue Campral 333 mg TID with meals, thiamine 100 mg once daily and folic acid. No alcohol for the past four weeks.   Has an appt with [facility] clinic.

## 2023-05-09 NOTE — Patient Instructions (Addendum)
 Stop by the lab prior to leaving today. I will notify you of your results once received.   Call [facility] regarding OT/PT.   Update me regarding the GI appointment.   Keep the urology appointment.   Follow up 2-3 weeks.   It was a pleasure to see you today!

## 2023-05-10 ENCOUNTER — Other Ambulatory Visit: Payer: Self-pay | Admitting: General Practice

## 2023-05-10 ENCOUNTER — Encounter: Payer: Self-pay | Admitting: General Practice

## 2023-05-10 DIAGNOSIS — K7031 Alcoholic cirrhosis of liver with ascites: Secondary | ICD-10-CM

## 2023-05-10 NOTE — Transitions of Care (Post Inpatient/ED Visit) (Signed)
   05/10/2023  Name: Robert Sellers MRN: [id number] DOB: [birth year]  Today's TOC FU Call Status: Today's TOC FU Call Status:: Successful TOC FU Call Completed Unsuccessful Call (1st Attempt) Date: [facility]/09/23 [facility] Call Complete Date: 05/10/23 Patient's Name and Date of Birth confirmed.  Transition Care Management Follow-up Telephone Call Date of Discharge: 05/06/23 Discharge Facility: Other [facility]) Name of Other (Non[facility]) Discharge Facility: [facility] Type of Discharge: Inpatient Admission Primary Inpatient Discharge Diagnosis:: cirrhosis liver How have you been since you were released from the hospital?: Same Any questions or concerns?: No  Items Reviewed: Did you receive and understand the discharge instructions provided?: Yes Medications obtained,verified, and reconciled?: Yes (Medications Reviewed) Any new allergies since your discharge?: No Dietary orders reviewed?: Yes Do you have support at home?: No  Medications Reviewed Today: Medications Reviewed Today     Reviewed by Karena Addison, LPN (Licensed Practical Nurse) on 05/10/23 at 1340  Med List Status: <None>   Medication Order Taking? Sig Documenting Provider Last Dose Status Informant  acamprosate (CAMPRAL) 333 MG tablet [id number]  Take 666 mg by mouth 3 (three) times daily with meals. [provider]  Active Self  carvedilol (COREG) 6.25 MG tablet [id number]  Take 6.25 mg by mouth 2 (two) times daily with a meal. [provider]  Active Self  folic acid (FOLVITE) 1 MG tablet [id number] No Take 1 tablet (1 mg total) by mouth daily. Alford Highland, MD Taking Active   furosemide (LASIX) 40 MG tablet [id number]  Take 40 mg by mouth daily. [provider]  Active   gabapentin (NEURONTIN) 300 MG capsule [id number]  Take 300 mg by mouth daily as needed. [provider]  Active   Multiple Vitamin (MULTIVITAMIN WITH MINERALS) TABS tablet [id number] No Take 1 tablet by mouth daily.  Alford Highland, MD Taking Active   pantoprazole (PROTONIX) 40 MG tablet [id number] No Take 1 tablet (40 mg total) by mouth 2 (two) times daily. Alford Highland, MD Taking Active   rifaximin (XIFAXAN) 550 MG TABS tablet [id number] No Take 1 tablet (550 mg total) by mouth 2 (two) times daily. Alford Highland, MD Taking Active   sertraline (ZOLOFT) 50 MG tablet [id number] No Take 1 tablet (50 mg total) by mouth daily. Modesto Charon, NP Taking Active   spironolactone (ALDACTONE) 100 MG tablet [id number]  Take 100 mg by mouth daily. [provider]  Active Self  thiamine (VITAMIN B-1) 100 MG tablet [id number] No Take 1 tablet (100 mg total) by mouth daily. Alford Highland, MD Taking Active             Home Care and Equipment/Supplies: Were Home Health Services Ordered?: NA Any new equipment or medical supplies ordered?: NA  Functional Questionnaire: Do you need assistance with bathing/showering or dressing?: No Do you need assistance with meal preparation?: No Do you need assistance with eating?: No Do you have difficulty maintaining continence: No Do you need assistance with getting out of bed/getting out of a chair/moving?: No Do you have difficulty managing or taking your medications?: No  Follow up appointments reviewed: PCP Follow-up appointment confirmed?: NA Specialist Hospital Follow-up appointment confirmed?: Yes Date of Specialist follow-up appointment?: 05/13/23 Follow-Up Specialty Provider:: GI Do you need transportation to your follow-up appointment?: No Do you understand care options if your condition(s) worsen?: Yes-patient verbalized understanding    SIGNATURE Karena Addison, LPN [facility] Nurse Health Advisor Direct Dial [phone]

## 2023-05-11 ENCOUNTER — Encounter: Payer: Self-pay | Admitting: *Deleted

## 2023-05-11 ENCOUNTER — Telehealth (HOSPITAL_COMMUNITY): Payer: Self-pay | Admitting: Licensed Clinical Social Worker

## 2023-05-11 NOTE — Telephone Encounter (Signed)
 The therapist calls Robert Sellers confirming his identity via two identifiers. Robert Sellers says that he is looking into an IOP with [facility]. He has been in a program 6-7 years ago at [facility] but did not take is seriously but plans on doing now. The therapist provides him with an overview of [facility] and answers his questions.   He says that he will hear from [facility] by Friday adding that the draw with their program is that he can also be connected to their Hematology so will get "more bang for the buck."   Robert Sellers says that he will call back after talking with [facility] and is given this therapist's direct callback number.  Myrna Blazer, MA, LCSW, [facility], LCAS 05/11/2023

## 2023-05-12 ENCOUNTER — Other Ambulatory Visit: Payer: Self-pay | Admitting: General Practice

## 2023-05-12 ENCOUNTER — Other Ambulatory Visit: Payer: Self-pay

## 2023-05-12 ENCOUNTER — Encounter: Payer: Self-pay | Admitting: General Practice

## 2023-05-12 MED ORDER — ACAMPROSATE CALCIUM 333 MG PO TBEC
666.0000 mg | DELAYED_RELEASE_TABLET | Freq: Three times a day (TID) | ORAL | 0 refills | Status: DC
  Filled 2023-05-12: qty 180, 30d supply, fill #0

## 2023-05-13 ENCOUNTER — Ambulatory Visit
Admission: RE | Admit: 2023-05-13 | Discharge: 2023-05-13 | Disposition: A | Discharge status: Home / Self Care | Attending: Gastroenterology | Admitting: Gastroenterology

## 2023-05-13 ENCOUNTER — Other Ambulatory Visit: Payer: Self-pay

## 2023-05-13 ENCOUNTER — Encounter: Payer: Self-pay | Admitting: Gastroenterology

## 2023-05-13 ENCOUNTER — Ambulatory Visit

## 2023-05-13 ENCOUNTER — Encounter
Admission: RE | Disposition: A | Discharge status: Home / Self Care | Payer: Self-pay | Source: Home / Self Care | Attending: Gastroenterology

## 2023-05-13 DIAGNOSIS — I10 Essential (primary) hypertension: Secondary | ICD-10-CM | POA: Diagnosis not present

## 2023-05-13 DIAGNOSIS — I8500 Esophageal varices without bleeding: Secondary | ICD-10-CM | POA: Diagnosis not present

## 2023-05-13 DIAGNOSIS — K766 Portal hypertension: Secondary | ICD-10-CM | POA: Diagnosis not present

## 2023-05-13 DIAGNOSIS — W19XXXA Unspecified fall, initial encounter: Secondary | ICD-10-CM | POA: Insufficient documentation

## 2023-05-13 DIAGNOSIS — Z79899 Other long term (current) drug therapy: Secondary | ICD-10-CM | POA: Diagnosis not present

## 2023-05-13 DIAGNOSIS — D696 Thrombocytopenia, unspecified: Secondary | ICD-10-CM | POA: Diagnosis not present

## 2023-05-13 DIAGNOSIS — K7460 Unspecified cirrhosis of liver: Secondary | ICD-10-CM | POA: Insufficient documentation

## 2023-05-13 DIAGNOSIS — K3189 Other diseases of stomach and duodenum: Secondary | ICD-10-CM | POA: Diagnosis not present

## 2023-05-13 DIAGNOSIS — I8511 Secondary esophageal varices with bleeding: Secondary | ICD-10-CM | POA: Diagnosis not present

## 2023-05-13 HISTORY — PX: ESOPHAGOGASTRODUODENOSCOPY: SHX5428

## 2023-05-13 SURGERY — EGD (ESOPHAGOGASTRODUODENOSCOPY)
Anesthesia: General

## 2023-05-13 MED ORDER — PROPOFOL 10 MG/ML IV BOLUS
INTRAVENOUS | Status: DC | PRN
  Administered 2023-05-13: 80 mg via INTRAVENOUS
  Administered 2023-05-13: 30 mg via INTRAVENOUS
  Administered 2023-05-13: 40 mg via INTRAVENOUS
  Administered 2023-05-13 (×2): 30 mg via INTRAVENOUS
  Administered 2023-05-13: 20 mg via INTRAVENOUS
  Administered 2023-05-13 (×2): 30 mg via INTRAVENOUS

## 2023-05-13 MED ORDER — LIDOCAINE HCL (CARDIAC) PF 100 MG/5ML IV SOSY
PREFILLED_SYRINGE | INTRAVENOUS | Status: DC | PRN
Start: 2023-05-13 — End: 2023-05-13
  Administered 2023-05-13: 100 mg via INTRAVENOUS

## 2023-05-13 MED ORDER — SODIUM CHLORIDE 0.9 % IV SOLN: INTRAVENOUS | Status: DC

## 2023-05-13 MED ORDER — GABAPENTIN 300 MG PO CAPS
300.0000 mg | ORAL_CAPSULE | Freq: Every day | ORAL | 0 refills | Status: DC | PRN
  Filled 2023-05-13: qty 30, 30d supply, fill #0

## 2023-05-13 MED ORDER — DEXMEDETOMIDINE HCL IN NACL 80 MCG/20ML IV SOLN
INTRAVENOUS | Status: DC | PRN
  Administered 2023-05-13 (×2): 4 ug via INTRAVENOUS

## 2023-05-13 NOTE — Transfer of Care (Signed)
 Immediate Anesthesia Transfer of Care Note  Patient: Robert Sellers  Procedure(s) Performed: EGD (ESOPHAGOGASTRODUODENOSCOPY)  Patient Location: PACU and Endoscopy Unit  Anesthesia Type:General  Level of Consciousness: sedated  Airway & Oxygen Therapy: Patient Spontanous Breathing and Patient connected to face mask oxygen  Post-op Assessment: Report given to RN and Post -op Vital signs reviewed and stable  Post vital signs: Reviewed and stable  Last Vitals:  Vitals Value Taken Time  BP 119/84 05/13/23 1455  Temp 35.9 C 05/13/23 1454  Pulse 93 05/13/23 1456  Resp 21 05/13/23 1456  SpO2 100 % 05/13/23 1456  Vitals shown include unfiled device data.  Last Pain:  Vitals:   05/13/23 1454  TempSrc: Temporal  PainSc: Asleep         Complications: No notable events documented.

## 2023-05-13 NOTE — H&P (Signed)
 Pre-Procedure H&P   Patient ID: Robert Sellers is a 43 y.o. male.  Gastroenterology Provider: Jaynie Collins, DO  PCP: Modesto Charon, NP  Date: 05/13/2023  HPI Mr. Robert Sellers is a 43 y.o. male who presents today for Esophagogastroduodenoscopy for Esophageal varices .  Patient underwent banding x 5 and 04/12/2023. Recently hospitalized at [facility] for worsening lower extremity edema and ascites requiring paracentesis.  He scheduled to follow-up with hepatology there.  He was started on carvedilol Lasix and spironolactone  He reports he has been without drinking for 4 weeks   Past Medical History:  Diagnosis Date   Acute blood loss anemia 10/01/2022   Acute upper GI bleeding 09/29/2022   Anxiety    Ascites    Clotting disorder (HCC)    Depression    Hyperlipidemia 12/04/2012   Hypertension    Left shoulder pain 08/28/2015   Substance abuse [facility])     Past Surgical History:  Procedure Laterality Date   ESOPHAGOGASTRODUODENOSCOPY N/A 04/12/2023   Procedure: EGD (ESOPHAGOGASTRODUODENOSCOPY);  Surgeon: Midge Minium, MD;  Location: [facility] ENDOSCOPY;  Service: Endoscopy;  Laterality: N/A;   ESOPHAGOGASTRODUODENOSCOPY (EGD) WITH PROPOFOL N/A 09/30/2022   Procedure: ESOPHAGOGASTRODUODENOSCOPY (EGD) WITH PROPOFOL;  Surgeon: Toledo, Boykin Nearing, MD;  Location: [facility] ENDOSCOPY;  Service: Gastroenterology;  Laterality: N/A;   GASTRIC VARICES BANDING  04/12/2023   Procedure: BAND LIGATION, GASTRIC VARICES;  Surgeon: Midge Minium, MD;  Location: [facility] ENDOSCOPY;  Service: Endoscopy;;   KNEE SURGERY     left shoulder surgery Left 2024   [facility]    Family History No h/o GI disease or malignancy  Review of Systems  Constitutional:  Negative for activity change, appetite change, chills, diaphoresis, fatigue, fever and unexpected weight change.  HENT:  Negative for trouble swallowing and voice change.   Respiratory:  Negative for shortness of breath and wheezing.   Cardiovascular:  Negative  for chest pain, palpitations and leg swelling.  Gastrointestinal:  Positive for abdominal distention. Negative for abdominal pain, anal bleeding, blood in stool, constipation, diarrhea, nausea and vomiting.  Musculoskeletal:  Negative for arthralgias and myalgias.  Skin:  Negative for color change and pallor.  Neurological:  Negative for dizziness, syncope and weakness.  Psychiatric/Behavioral:  Negative for confusion. The patient is not nervous/anxious.   All other systems reviewed and are negative.    Medications No current facility-administered medications on file prior to encounter.   Current Outpatient Medications on File Prior to Encounter  Medication Sig Dispense Refill   acamprosate (CAMPRAL) 333 MG tablet Take 666 mg by mouth 3 (three) times daily with meals.     carvedilol (COREG) 6.25 MG tablet Take 6.25 mg by mouth 2 (two) times daily with a meal.     folic acid (FOLVITE) 1 MG tablet Take 1 tablet (1 mg total) by mouth daily. 30 tablet 0   furosemide (LASIX) 40 MG tablet Take 40 mg by mouth daily.     Multiple Vitamin (MULTIVITAMIN WITH MINERALS) TABS tablet Take 1 tablet by mouth daily.     pantoprazole (PROTONIX) 40 MG tablet Take 1 tablet (40 mg total) by mouth 2 (two) times daily. 60 tablet 0   rifaximin (XIFAXAN) 550 MG TABS tablet Take 1 tablet (550 mg total) by mouth 2 (two) times daily. 60 tablet 0   sertraline (ZOLOFT) 50 MG tablet Take 1 tablet (50 mg total) by mouth daily. 30 tablet 0   spironolactone (ALDACTONE) 100 MG tablet Take 100 mg by mouth daily.  thiamine (VITAMIN B-1) 100 MG tablet Take 1 tablet (100 mg total) by mouth daily. 30 tablet 0    Pertinent medications related to GI and procedure were reviewed by me with the patient prior to the procedure   Current Facility-Administered Medications:    0.9 %  sodium chloride infusion, , Intravenous, Continuous, Jaynie Collins, DO, Last Rate: 20 mL/hr at 05/13/23 1356, New Bag at 05/13/23 1356  sodium  chloride 20 mL/hr at 05/13/23 1356       No Known Allergies Allergies were reviewed by me prior to the procedure  Objective   Body mass index is 33.52 kg/m. Vitals:   05/13/23 1344  BP: (!) 132/91  Pulse: 97  Resp: 15  Temp: (!) 97.4 F (36.3 C)  TempSrc: Temporal  SpO2: 100%  Weight: 106 kg  Height: 5\' 10"  (1.778 m)     Physical Exam Vitals and nursing note reviewed.  Constitutional:      General: He is not in acute distress.    Appearance: He is ill-appearing. He is not toxic-appearing or diaphoretic.  HENT:     Head: Normocephalic and atraumatic.     Nose: Nose normal.     Mouth/Throat:     Mouth: Mucous membranes are moist.     Pharynx: Oropharynx is clear.  Eyes:     General: No scleral icterus.    Extraocular Movements: Extraocular movements intact.  Cardiovascular:     Rate and Rhythm: Normal rate and regular rhythm.     Heart sounds: Normal heart sounds. No murmur heard.    No friction rub. No gallop.  Pulmonary:     Effort: Pulmonary effort is normal. No respiratory distress.     Breath sounds: Normal breath sounds. No wheezing, rhonchi or rales.  Abdominal:     General: Bowel sounds are normal. There is distension.     Tenderness: There is no abdominal tenderness. There is no guarding or rebound.  Musculoskeletal:     Cervical back: Neck supple.     Right lower leg: Edema present.     Left lower leg: Edema present.  Skin:    General: Skin is warm and dry.     Coloration: Skin is jaundiced. Skin is not pale.  Neurological:     General: No focal deficit present.     Mental Status: He is alert and oriented to person, place, and time. Mental status is at baseline.  Psychiatric:        Mood and Affect: Mood normal.        Behavior: Behavior normal.        Thought Content: Thought content normal.        Judgment: Judgment normal.      Assessment:  Mr. Robert Sellers is a 43 y.o. male  who presents today for Esophagogastroduodenoscopy for  Esophageal varices .  Plan:  Esophagogastroduodenoscopy with possible intervention today  Esophagogastroduodenoscopy with possible biopsy, control of bleeding, polypectomy, and interventions as necessary has been discussed with the patient/patient representative. Informed consent was obtained from the patient/patient representative after explaining the indication, nature, and risks of the procedure including but not limited to death, bleeding, perforation, missed neoplasm/lesions, cardiorespiratory compromise, and reaction to medications. Opportunity for questions was given and appropriate answers were provided. Patient/patient representative has verbalized understanding is amenable to undergoing the procedure.   Jaynie Collins, DO  [facility] Gastroenterology  Portions of the record may have been created with voice recognition software. Occasional wrong-word or 'sound-a-like'  substitutions may have occurred due to the inherent limitations of voice recognition software.  Read the chart carefully and recognize, using context, where substitutions may have occurred.

## 2023-05-13 NOTE — Telephone Encounter (Signed)
 LOV - 05/09/23 NOV - not scheduled RF - in as historical

## 2023-05-13 NOTE — Interval H&P Note (Signed)
 History and Physical Interval Note: Preprocedure H&P from 05/13/23  was reviewed and there was no interval change after seeing and examining the patient.  Written consent was obtained from the patient after discussion of risks, benefits, and alternatives. Patient has consented to proceed with Esophagogastroduodenoscopy with possible intervention   05/13/2023 2:35 PM  [name]  has presented today for surgery, with the diagnosis of Secondary esophageal varices with bleeding (CMS/HHS-HCC) (I85.11).  The various methods of treatment have been discussed with the patient and family. After consideration of risks, benefits and other options for treatment, the patient has consented to  Procedure(s): EGD (ESOPHAGOGASTRODUODENOSCOPY) (N/A) as a surgical intervention.  The patient's history has been reviewed, patient examined, no change in status, stable for surgery.  I have reviewed the patient's chart and labs.  Questions were answered to the patient's satisfaction.     Robert Sellers

## 2023-05-13 NOTE — Anesthesia Preprocedure Evaluation (Signed)
 Anesthesia Evaluation  Patient identified by MRN, date of birth, ID band Patient awake  General Assessment Comment:  Active hematemesis. Of note, patient had a fall from seated position this morning, injuring his face and right arm. X rays of upper extremities with no evidence of fracture. Did not get his head scanned  Reviewed: Allergy & Precautions, NPO status , Patient's Chart, lab work & pertinent test results  History of Anesthesia Complications Negative for: history of anesthetic complications  Airway Mallampati: II  TM Distance: >3 FB Neck ROM: Full   Comment: Blood in beard Dental  (+) Teeth Intact, Dental Advidsory Given   Pulmonary neg pulmonary ROS, neg shortness of breath, neg sleep apnea, neg COPD, neg recent URI, Patient abstained from smoking.Not current smoker   Pulmonary exam normal breath sounds clear to auscultation       Cardiovascular Exercise Tolerance: Good METShypertension, Pt. on medications (-) angina (-) CAD and (-) Past MI (-) dysrhythmias  Rhythm:Regular Rate:Normal - Systolic murmurs    Neuro/Psych  PSYCHIATRIC DISORDERS Anxiety Depression    negative neurological ROS     GI/Hepatic ,neg GERD  ,,(+) Cirrhosis   Esophageal Varices and ascites  substance abuse  alcohol useDaily drinker. Has had shakes and "the DT's" after cessation of alcohol. Denies ever having withdrawal seizures.   Endo/Other  neg diabetes    Renal/GU negative Renal ROS     Musculoskeletal   Abdominal   Peds  Hematology  (+) Blood dyscrasia, anemia Thrombocytopenia    Anesthesia Other Findings Past Medical History: 12/04/2012: Hyperlipidemia No date: Hypertension  Reproductive/Obstetrics                             Anesthesia Physical Anesthesia Plan  ASA: 3  Anesthesia Plan: General   Post-op Pain Management: Minimal or no pain anticipated   Induction: Intravenous  PONV Risk  Score and Plan: 2 and Treatment may vary due to age or medical condition, Propofol infusion and TIVA  Airway Management Planned: Natural Airway and Nasal Cannula  Additional Equipment: None  Intra-op Plan:   Post-operative Plan:   Informed Consent: I have reviewed the patients History and Physical, chart, labs and discussed the procedure including the risks, benefits and alternatives for the proposed anesthesia with the patient or authorized representative who has indicated his/her understanding and acceptance.     Dental advisory given  Plan Discussed with: CRNA and Surgeon  Anesthesia Plan Comments: (Discussed risks of anesthesia with patient, including PONV, sore throat, lip/dental/eye damage, aspiration. Rare risks discussed as well, such as cardiorespiratory and neurological sequelae, and allergic reactions. Discussed the role of CRNA in patient's perioperative care. Patient understands. Encouraged alcohol cessation, and finding support and treatment options of alcoholism.)       Anesthesia Quick Evaluation

## 2023-05-13 NOTE — Op Note (Signed)
 [facility] Gastroenterology Patient Name: Robert Sellers Procedure Date: 05/13/2023 2:33 PM MRN: [id number] Account #: [id number] Date of Birth: 10-May-1980 Admit Type: Outpatient Age: 43 Room: [facility] ENDO ROOM 1 Gender: Male Note Status: Finalized Instrument Name: Upper Endoscope [id number] Procedure:             Upper GI endoscopy Indications:           For therapy of esophageal varices Providers:             Jaynie Collins DO, DO Referring MD:          No Local Md, MD (Referring MD) Medicines:             Monitored Anesthesia Care Complications:         No immediate complications. Estimated blood loss: None. Procedure:             Pre-Anesthesia Assessment:                        - Prior to the procedure, a History and Physical was                         performed, and patient medications and allergies were                         reviewed. The patient is competent. The risks and                         benefits of the procedure and the sedation options and                         risks were discussed with the patient. All questions                         were answered and informed consent was obtained.                         Patient identification and proposed procedure were                         verified by the physician, the nurse, the anesthetist                         and the technician in the endoscopy suite. Mental                         Status Examination: alert and oriented. Airway                         Examination: normal oropharyngeal airway and neck                         mobility. Respiratory Examination: clear to                         auscultation. CV Examination: RRR, no murmurs, no S3                         or S4. Prophylactic Antibiotics: The patient does not  require prophylactic antibiotics. Prior                         Anticoagulants: The patient has taken no anticoagulant                         or  antiplatelet agents. ASA Grade Assessment: III - A                         patient with severe systemic disease. After reviewing                         the risks and benefits, the patient was deemed in                         satisfactory condition to undergo the procedure. The                         anesthesia plan was to use monitored anesthesia care                         (MAC). Immediately prior to administration of                         medications, the patient was re-assessed for adequacy                         to receive sedatives. The heart rate, respiratory                         rate, oxygen saturations, blood pressure, adequacy of                         pulmonary ventilation, and response to care were                         monitored throughout the procedure. The physical                         status of the patient was re-assessed after the                         procedure.                        After obtaining informed consent, the endoscope was                         passed under direct vision. Throughout the procedure,                         the patient's blood pressure, pulse, and oxygen                         saturations were monitored continuously. The Endoscope                         was introduced through the mouth, and advanced to the  second part of duodenum. The upper GI endoscopy was                         accomplished without difficulty. The patient tolerated                         the procedure well. Findings:      The duodenal bulb, first portion of the duodenum and second portion of       the duodenum were normal. Estimated blood loss: none.      Mild portal hypertensive gastropathy was found in the gastric body.       Estimated blood loss: none.      The exam of the stomach was otherwise normal.      Grade II varices were found in the lower third of the esophagus. They       were medium in size. Two bands were  successfully placed with one on each       varix. Proximal decompression of both varix upon EVL. There was no       bleeding during and at the end of the procedure. Estimated blood loss:       none.      The exam of the esophagus was otherwise normal. Impression:            - Normal duodenal bulb, first portion of the duodenum                         and second portion of the duodenum.                        - Portal hypertensive gastropathy.                        - Grade II esophageal varices. Banded.                        - No specimens collected. Recommendation:        - Patient has a contact number available for                         emergencies. The signs and symptoms of potential                         delayed complications were discussed with the patient.                         Return to normal activities tomorrow. Written                         discharge instructions were provided to the patient.                        - Discharge patient to home.                        - Soft diet today.                        - Continue present medications.                        -  Repeat upper endoscopy 2-3 weeks for retreatment.                        - Return to GI office in 4 days.                        - Follow up with [facility] Hepatology as scheduled.                        - The findings and recommendations were discussed with                         the patient. Procedure Code(s):     --- Professional ---                        [phone], Esophagogastroduodenoscopy, flexible,                         transoral; with band ligation of esophageal/gastric                         varices Diagnosis Code(s):     --- Professional ---                        K76.6, Portal hypertension                        K31.89, Other diseases of stomach and duodenum                        I85.00, Esophageal varices without bleeding CPT copyright 2022 American Medical Association. All rights reserved. The  codes documented in this report are preliminary and upon coder review may  be revised to meet current compliance requirements. Attending Participation:      I personally performed the entire procedure. Elfredia Nevins, DO Jaynie Collins DO, DO 05/13/2023 2:56:21 PM This report has been signed electronically. Number of Addenda: 0 Note Initiated On: 05/13/2023 2:33 PM Estimated Blood Loss:  Estimated blood loss: none.      [facility]

## 2023-05-16 ENCOUNTER — Encounter: Payer: Self-pay | Admitting: Gastroenterology

## 2023-05-17 ENCOUNTER — Ambulatory Visit
Admission: RE | Admit: 2023-05-17 | Discharge: 2023-05-17 | Disposition: A | Discharge status: Home / Self Care | Source: Ambulatory Visit | Attending: General Practice | Admitting: General Practice

## 2023-05-17 ENCOUNTER — Ambulatory Visit: Admitting: Urology

## 2023-05-17 ENCOUNTER — Other Ambulatory Visit: Payer: Self-pay | Admitting: Gastroenterology

## 2023-05-17 ENCOUNTER — Ambulatory Visit (INDEPENDENT_AMBULATORY_CARE_PROVIDER_SITE_OTHER): Admitting: Urology

## 2023-05-17 VITALS — BP 130/88 | HR 91 | Ht 70.0 in | Wt 231.2 lb

## 2023-05-17 DIAGNOSIS — N5089 Other specified disorders of the male genital organs: Secondary | ICD-10-CM

## 2023-05-17 DIAGNOSIS — K766 Portal hypertension: Secondary | ICD-10-CM | POA: Diagnosis not present

## 2023-05-17 DIAGNOSIS — F1010 Alcohol abuse, uncomplicated: Secondary | ICD-10-CM | POA: Diagnosis not present

## 2023-05-17 DIAGNOSIS — Z87898 Personal history of other specified conditions: Secondary | ICD-10-CM | POA: Diagnosis not present

## 2023-05-17 DIAGNOSIS — K7031 Alcoholic cirrhosis of liver with ascites: Secondary | ICD-10-CM

## 2023-05-17 DIAGNOSIS — I8511 Secondary esophageal varices with bleeding: Secondary | ICD-10-CM | POA: Diagnosis not present

## 2023-05-17 DIAGNOSIS — K7290 Hepatic failure, unspecified without coma: Secondary | ICD-10-CM | POA: Diagnosis not present

## 2023-05-17 DIAGNOSIS — R188 Other ascites: Secondary | ICD-10-CM | POA: Diagnosis not present

## 2023-05-17 LAB — BLADDER SCAN AMB NON-IMAGING

## 2023-05-17 LAB — BODY FLUID CELL COUNT WITH DIFFERENTIAL
Eos, Fluid: 0 %
Lymphs, Fluid: 36 %
Monocyte-Macrophage-Serous Fluid: 63 %
Neutrophil Count, Fluid: 1 %
Total Nucleated Cell Count, Fluid: 242 uL

## 2023-05-17 LAB — ALBUMIN, PLEURAL OR PERITONEAL FLUID: Albumin, Fluid: <1.5 g/dL

## 2023-05-17 LAB — PROTEIN, PLEURAL OR PERITONEAL FLUID: Total protein, fluid: <3 g/dL

## 2023-05-17 MED ORDER — LIDOCAINE HCL (PF) 1 % IJ SOLN
10.0000 mL | Freq: Once | INTRAMUSCULAR | Status: AC
  Administered 2023-05-17: 10 mL via INTRADERMAL
  Filled 2023-05-17: qty 10

## 2023-05-17 MED ORDER — ALBUMIN HUMAN 25 % IV SOLN: 25.0000 g | Freq: Once | INTRAVENOUS | Status: DC | PRN

## 2023-05-17 MED ORDER — ALBUMIN HUMAN 25 % IV SOLN
25.0000 g | Freq: Once | INTRAVENOUS | Status: AC
  Administered 2023-05-17: 25 g via INTRAVENOUS

## 2023-05-17 MED ORDER — ALBUMIN HUMAN 25 % IV SOLN
INTRAVENOUS | Status: AC
  Filled 2023-05-17: qty 100

## 2023-05-17 NOTE — Patient Instructions (Signed)
 Wear tight fitting underwear to help with scrotal swelling.  You can place a folded washcloth under your scrotum when you are seated or laying down to help with swelling.  Your bladder and prostate are normal on the recent CT scan, and your abdominal ultrasound will always look like a full bladder because it is reading the ascites fluid in your belly.  Would not recommend having a catheter placed just for elevated number on ultrasound

## 2023-05-17 NOTE — Progress Notes (Signed)
 05/17/23 9:24 AM   Robert Sellers 1980/04/22 [id number]  CC: Urinary symptoms, scrotal pain  HPI: 43 year old male with alcoholic cirrhosis and ascites who was recently hospitalized at both [facility] and [facility].  I reviewed those notes at length.  Essentially it sounds like at [facility] he had a Foley catheter placed for an elevated PVR, however based on CT this was most likely significant ascites and not incomplete bladder emptying.  He had some lower abdominal pressure at that time, but was likely secondary to his significant ascites, again not incomplete emptying.  His catheter was removed at [facility] at a follow-up hospitalization, and he has been urinating well since that time.  He really denies any significant urinary complaints today.  Bladder scan today >1517ml, high suspicion this is reading abdominal ascites.  He also had significant scrotal and penile swelling, and scrotal ultrasound was benign.  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  He has a history of kidney stones, previously was managed by urologist in [place].  No prior history of bladder stones, referral notes are incorrect.   PMH: Past Medical History:  Diagnosis Date   Acute blood loss anemia 10/01/2022   Acute upper GI bleeding 09/29/2022   Anxiety    Ascites    Clotting disorder (HCC)    Depression    Hyperlipidemia 12/04/2012   Hypertension    Left shoulder pain 08/28/2015   Substance abuse [facility])     Surgical History: Past Surgical History:  Procedure Laterality Date   ESOPHAGOGASTRODUODENOSCOPY N/A 04/12/2023   Procedure: EGD (ESOPHAGOGASTRODUODENOSCOPY);  Surgeon: Midge Minium, MD;  Location: [facility] ENDOSCOPY;  Service: Endoscopy;  Laterality: N/A;   ESOPHAGOGASTRODUODENOSCOPY N/A 05/13/2023   Procedure: EGD (ESOPHAGOGASTRODUODENOSCOPY);  Surgeon: Jaynie Collins, DO;  Location:  [facility] ENDOSCOPY;  Service: Gastroenterology;  Laterality: N/A;   ESOPHAGOGASTRODUODENOSCOPY (EGD) WITH PROPOFOL N/A 09/30/2022   Procedure: ESOPHAGOGASTRODUODENOSCOPY (EGD) WITH PROPOFOL;  Surgeon: Toledo, Boykin Nearing, MD;  Location: [facility] ENDOSCOPY;  Service: Gastroenterology;  Laterality: N/A;   GASTRIC VARICES BANDING  04/12/2023   Procedure: BAND LIGATION, GASTRIC VARICES;  Surgeon: Midge Minium, MD;  Location: [facility] ENDOSCOPY;  Service: Endoscopy;;   KNEE SURGERY     left shoulder surgery Left 2024   [facility]     Family History: Family History  Problem Relation Age of Onset   Heart disease Mother    Hyperlipidemia Mother    Hypertension Mother    Diabetes Mother    Early death Mother    Obesity Mother    Heart disease Father    Hyperlipidemia Father    Hypertension Father    Diabetes Father    Alcohol abuse Father    Early death Father    Kidney disease Father    Obesity Father     Social History:  reports that he has never smoked. He has never used smokeless tobacco. He reports that he does not currently use alcohol after a past usage of about 1.0 standard drink of alcohol per week. He reports that he does not use drugs.  Physical Exam: BP 130/88 (BP Location: Left Arm, Patient Position: Sitting, Cuff Size: Normal)   Pulse 91   Ht 5\' 10"  (1.778 m)   Wt 231 lb 3.2 oz (104.9 kg)   SpO2 96%   BMI 33.17 kg/m    Constitutional:  Alert and oriented, No acute distress. Cardiovascular: No clubbing, cyanosis, or edema.  Respiratory: Normal respiratory effort, no increased work of breathing. GI: Abdomen is soft, nontender, nondistended, no abdominal masses GU: Phallus with patent meatus, mild penile and scrotal edema, no testicular masses  Laboratory Data: Reviewed, see HPI  Pertinent Imaging: I have personally viewed and interpreted the scrotal ultrasound and CT scan showing significant abdominal ascites, decompressed bladder, nonobstructive right renal stones, mild right  hydronephrosis likely secondary to chronic right UPJ obstruction, scrotal ultrasound with no abnormalities.  Assessment & Plan:   43 year old male with alcoholic cirrhosis and significant ascites.  Recently had a catheter placed for an elevated bladder scan, though high suspicion this was just reading his significant abdominal ascites, and bladder was decompressed on CT scan.  Foley catheter was removed at [facility] recently and he has been voiding spontaneously since that time without any issue.  We discussed his scrotal swelling most likely secondary to his cirrhosis and third spacing of fluid.  We discussed behavioral strategies including snug fitting underwear and scrotal elevation.  Kidney stone prevention strategies discussed.  Follow-up with urology as needed Do not place Foley for elevated bladder scan, as this is likely reading abdominal ascites, and bladder has been decompressed on CT scans    Jay Meth, MD 05/17/2023  [facility] [address] [place], [place] [address] [phone]

## 2023-05-17 NOTE — Procedures (Signed)
 PROCEDURE SUMMARY: Patient with alcoholic cirrhosis with ascites. Received paracentesis on 04/23/23 in ED, returns today for therapeutic and diagnostic paracentesis. GI and liver clinic referrals are pending per 4/7 note from B Deborra Falter, NP.  Successful image-guided paracentesis from the right lower abdomen.  Yielded 7.4 liters of yellow fluid.  No immediate complications.  EBL: <2 mL Patient tolerated well.   Specimen sent for labs.  Please see imaging section of Epic for full dictation.  Hill Mackie NP 05/17/2023 2:32 PM

## 2023-05-19 LAB — PATHOLOGIST SMEAR REVIEW

## 2023-05-20 LAB — BODY FLUID CULTURE W GRAM STAIN: Culture: NO GROWTH

## 2023-05-21 DIAGNOSIS — S46011A Strain of muscle(s) and tendon(s) of the rotator cuff of right shoulder, initial encounter: Secondary | ICD-10-CM | POA: Diagnosis not present

## 2023-05-21 DIAGNOSIS — S40011A Contusion of right shoulder, initial encounter: Secondary | ICD-10-CM | POA: Diagnosis not present

## 2023-05-21 NOTE — Anesthesia Postprocedure Evaluation (Signed)
 Anesthesia Post Note  Patient: Robert Sellers  Procedure(s) Performed: EGD (ESOPHAGOGASTRODUODENOSCOPY)  Patient location during evaluation: Endoscopy Anesthesia Type: General Level of consciousness: awake and alert Pain management: pain level controlled Vital Signs Assessment: post-procedure vital signs reviewed and stable Respiratory status: spontaneous breathing, nonlabored ventilation, respiratory function stable and patient connected to nasal cannula oxygen Cardiovascular status: blood pressure returned to baseline and stable Postop Assessment: no apparent nausea or vomiting Anesthetic complications: no   No notable events documented.   Last Vitals:  Vitals:   05/13/23 1504 05/13/23 1514  BP: (!) 128/93 (!) 136/94  Pulse: 92 85  Resp: (!) 30 (!) 32  Temp:    SpO2: 100% 99%    Last Pain:  Vitals:   05/16/23 0816  TempSrc:   PainSc: 1                  Vanice Genre

## 2023-05-26 DIAGNOSIS — S46011A Strain of muscle(s) and tendon(s) of the rotator cuff of right shoulder, initial encounter: Secondary | ICD-10-CM | POA: Diagnosis not present

## 2023-06-02 ENCOUNTER — Other Ambulatory Visit: Payer: Self-pay | Admitting: General Practice

## 2023-06-02 DIAGNOSIS — F419 Anxiety disorder, unspecified: Secondary | ICD-10-CM

## 2023-06-02 NOTE — H&P (Signed)
 Pre-Procedure H&P   Patient ID: Robert Sellers is a 43 y.o. male.  Gastroenterology Provider: Quintin Buckle, DO  PCP: Jolanda Nation, NP  Date: 06/03/2023  HPI Mr. Robert Sellers is a 43 y.o. male who presents today for Esophagogastroduodenoscopy for esophageal varices banding.     Patient underwent banding x 5 and 04/12/2023.  Follow-up upper endoscopy on 05/13/2023 required 2 bands.  Today's EGD is to evaluate for eradication versus need for repeated banding.  Continues to remain sober.  Upcoming appointment with [facility] hepatology next week  Abdominal distention/ascites has greatly improved. Abdomen is soft today. Has not required repeat paracentesis since after his last office visit.  Past Medical History:  Diagnosis Date   Acute blood loss anemia 10/01/2022   Acute upper GI bleeding 09/29/2022   Anxiety    Ascites    Clotting disorder (HCC)    Depression    Hyperlipidemia 12/04/2012   Hypertension    Left shoulder pain 08/28/2015   Substance abuse [facility])     Past Surgical History:  Procedure Laterality Date   ESOPHAGOGASTRODUODENOSCOPY N/A 04/12/2023   Procedure: EGD (ESOPHAGOGASTRODUODENOSCOPY);  Surgeon: Marnee Sink, MD;  Location: [facility] ENDOSCOPY;  Service: Endoscopy;  Laterality: N/A;   ESOPHAGOGASTRODUODENOSCOPY N/A 05/13/2023   Procedure: EGD (ESOPHAGOGASTRODUODENOSCOPY);  Surgeon: Quintin Buckle, DO;  Location: [facility] ENDOSCOPY;  Service: Gastroenterology;  Laterality: N/A;   ESOPHAGOGASTRODUODENOSCOPY (EGD) WITH PROPOFOL  N/A 09/30/2022   Procedure: ESOPHAGOGASTRODUODENOSCOPY (EGD) WITH PROPOFOL ;  Surgeon: Toledo, Alphonsus Jeans, MD;  Location: [facility] ENDOSCOPY;  Service: Gastroenterology;  Laterality: N/A;   GASTRIC VARICES BANDING  04/12/2023   Procedure: BAND LIGATION, GASTRIC VARICES;  Surgeon: Marnee Sink, MD;  Location: [facility] ENDOSCOPY;  Service: Endoscopy;;   KNEE SURGERY     left shoulder surgery Left 2024   [facility]    Family History No h/o GI disease or  malignancy  Review of Systems  Constitutional:  Negative for activity change, appetite change, chills, diaphoresis, fatigue, fever and unexpected weight change.  HENT:  Negative for trouble swallowing and voice change.   Respiratory:  Negative for shortness of breath and wheezing.   Cardiovascular:  Negative for chest pain, palpitations and leg swelling.  Gastrointestinal:  Negative for abdominal distention, abdominal pain, anal bleeding, blood in stool, constipation, diarrhea, nausea and vomiting.  Musculoskeletal:  Negative for arthralgias and myalgias.  Skin:  Negative for color change and pallor.  Neurological:  Negative for dizziness, syncope and weakness.  Psychiatric/Behavioral:  Negative for confusion. The patient is not nervous/anxious.   All other systems reviewed and are negative.    Medications No current facility-administered medications on file prior to encounter.   Current Outpatient Medications on File Prior to Encounter  Medication Sig Dispense Refill   carvedilol (COREG) 6.25 MG tablet Take 6.25 mg by mouth 2 (two) times daily with a meal.     furosemide (LASIX) 40 MG tablet Take 40 mg by mouth daily.     gabapentin  (NEURONTIN ) 300 MG capsule Take 1 capsule (300 mg total) by mouth daily as needed. 30 capsule 0   Multiple Vitamin (MULTIVITAMIN WITH MINERALS) TABS tablet Take 1 tablet by mouth daily.     pantoprazole  (PROTONIX ) 40 MG tablet Take 1 tablet (40 mg total) by mouth 2 (two) times daily. 60 tablet 0   rifaximin  (XIFAXAN ) 550 MG TABS tablet Take 1 tablet (550 mg total) by mouth 2 (two) times daily. 60 tablet 0   sertraline  (ZOLOFT ) 50 MG tablet TAKE 1 TABLET(50 MG) BY  MOUTH DAILY 30 tablet 2   spironolactone  (ALDACTONE ) 100 MG tablet Take 100 mg by mouth daily.     thiamine  (VITAMIN B-1) 100 MG tablet Take 1 tablet (100 mg total) by mouth daily. 30 tablet 0   acamprosate  (CAMPRAL ) 333 MG tablet Take 666 mg by mouth 3 (three) times daily with meals.      acamprosate  (CAMPRAL ) 333 MG tablet Take 2 tablets (666 mg total) by mouth 3 (three) times daily. 540 tablet 0   folic acid  (FOLVITE ) 1 MG tablet Take 1 tablet (1 mg total) by mouth daily. 30 tablet 0    Pertinent medications related to GI and procedure were reviewed by me with the patient prior to the procedure  No current facility-administered medications for this encounter.       No Known Allergies Allergies were reviewed by me prior to the procedure  Objective   Body mass index is 27.55 kg/m. Vitals:   06/03/23 1314  BP: 122/77  Pulse: 75  Resp: 16  Temp: (!) 97 F (36.1 C)  TempSrc: Temporal  SpO2: 98%  Weight: 87.1 kg     Physical Exam Vitals and nursing note reviewed.  Constitutional:      General: He is not in acute distress.    Appearance: He is not ill-appearing, toxic-appearing or diaphoretic.  HENT:     Head: Normocephalic and atraumatic.     Nose: Nose normal.     Mouth/Throat:     Mouth: Mucous membranes are moist.     Pharynx: Oropharynx is clear.  Eyes:     General: No scleral icterus.    Extraocular Movements: Extraocular movements intact.  Cardiovascular:     Rate and Rhythm: Normal rate and regular rhythm.     Heart sounds: Normal heart sounds. No murmur heard.    No friction rub. No gallop.  Pulmonary:     Effort: Pulmonary effort is normal. No respiratory distress.     Breath sounds: Normal breath sounds. No wheezing, rhonchi or rales.  Abdominal:     General: Bowel sounds are normal. There is no distension.     Palpations: Abdomen is soft.     Tenderness: There is no abdominal tenderness. There is no guarding or rebound.  Musculoskeletal:     Cervical back: Neck supple.     Right lower leg: No edema.     Left lower leg: No edema.  Skin:    General: Skin is warm and dry.     Coloration: Skin is not jaundiced or pale.  Neurological:     General: No focal deficit present.     Mental Status: He is alert and oriented to person, place,  and time. Mental status is at baseline.  Psychiatric:        Mood and Affect: Mood normal.        Behavior: Behavior normal.        Thought Content: Thought content normal.        Judgment: Judgment normal.      Assessment:  Mr. ALFONZO OVERMAN is a 43 y.o. male  who presents today for Esophagogastroduodenoscopy for esophageal varices banding.  Plan:  Esophagogastroduodenoscopy with possible intervention today  Esophagogastroduodenoscopy with possible biopsy, control of bleeding, polypectomy, and interventions as necessary has been discussed with the patient/patient representative. Informed consent was obtained from the patient/patient representative after explaining the indication, nature, and risks of the procedure including but not limited to death, bleeding, perforation, missed neoplasm/lesions, cardiorespiratory compromise, and reaction  to medications. Opportunity for questions was given and appropriate answers were provided. Patient/patient representative has verbalized understanding is amenable to undergoing the procedure.   Quintin Buckle, DO  [facility]  Portions of the record may have been created with voice recognition software. Occasional wrong-word or 'sound-a-like' substitutions may have occurred due to the inherent limitations of voice recognition software.  Read the chart carefully and recognize, using context, where substitutions may have occurred.

## 2023-06-03 ENCOUNTER — Ambulatory Visit: Admitting: Anesthesiology

## 2023-06-03 ENCOUNTER — Ambulatory Visit
Admission: RE | Admit: 2023-06-03 | Discharge: 2023-06-03 | Disposition: A | Discharge status: Home / Self Care | Attending: Gastroenterology | Admitting: Gastroenterology

## 2023-06-03 ENCOUNTER — Encounter
Admission: RE | Disposition: A | Discharge status: Home / Self Care | Payer: Self-pay | Source: Home / Self Care | Attending: Gastroenterology

## 2023-06-03 ENCOUNTER — Encounter: Payer: Self-pay | Admitting: Gastroenterology

## 2023-06-03 ENCOUNTER — Other Ambulatory Visit: Payer: Self-pay

## 2023-06-03 DIAGNOSIS — K3189 Other diseases of stomach and duodenum: Secondary | ICD-10-CM | POA: Diagnosis not present

## 2023-06-03 DIAGNOSIS — R188 Other ascites: Secondary | ICD-10-CM | POA: Insufficient documentation

## 2023-06-03 DIAGNOSIS — I10 Essential (primary) hypertension: Secondary | ICD-10-CM | POA: Diagnosis not present

## 2023-06-03 DIAGNOSIS — K766 Portal hypertension: Secondary | ICD-10-CM | POA: Insufficient documentation

## 2023-06-03 DIAGNOSIS — I8500 Esophageal varices without bleeding: Secondary | ICD-10-CM | POA: Insufficient documentation

## 2023-06-03 HISTORY — PX: ESOPHAGOGASTRODUODENOSCOPY: SHX5428

## 2023-06-03 SURGERY — EGD (ESOPHAGOGASTRODUODENOSCOPY)
Anesthesia: General

## 2023-06-03 MED ORDER — PROPOFOL 10 MG/ML IV BOLUS
INTRAVENOUS | Status: DC | PRN
  Administered 2023-06-03: 150 ug/kg/min via INTRAVENOUS
  Administered 2023-06-03: 50 mg via INTRAVENOUS
  Administered 2023-06-03: 100 mg via INTRAVENOUS

## 2023-06-03 MED ORDER — SODIUM CHLORIDE 0.9 % IV SOLN: INTRAVENOUS | Status: DC

## 2023-06-03 MED ORDER — LIDOCAINE HCL (CARDIAC) PF 100 MG/5ML IV SOSY
PREFILLED_SYRINGE | INTRAVENOUS | Status: DC | PRN
  Administered 2023-06-03: 100 mg via INTRAVENOUS

## 2023-06-03 MED ORDER — GLYCOPYRROLATE 0.2 MG/ML IJ SOLN
INTRAMUSCULAR | Status: DC | PRN
  Administered 2023-06-03: .2 mg via INTRAVENOUS

## 2023-06-03 NOTE — Interval H&P Note (Signed)
 History and Physical Interval Note: Preprocedure H&P from 06/03/23  was reviewed and there was no interval change after seeing and examining the patient.  Written consent was obtained from the patient after discussion of risks, benefits, and alternatives. Patient has consented to proceed with Esophagogastroduodenoscopy with possible intervention   06/03/2023 1:19 PM  Robert Sellers  has presented today for surgery, with the diagnosis of Secondary esophageal varices with bleeding (CMS/HHS-HCC) (I85.11).  The various methods of treatment have been discussed with the patient and family. After consideration of risks, benefits and other options for treatment, the patient has consented to  Procedure(s): EGD (ESOPHAGOGASTRODUODENOSCOPY) (N/A) as a surgical intervention.  The patient's history has been reviewed, patient examined, no change in status, stable for surgery.  I have reviewed the patient's chart and labs.  Questions were answered to the patient's satisfaction.     Quintin Buckle

## 2023-06-03 NOTE — Op Note (Signed)
 [facility] Gastroenterology Patient Name: Robert Sellers Procedure Date: 06/03/2023 1:24 PM MRN: [id number] Account #: [id number] Date of Birth: 1981/01/18 Admit Type: Outpatient Age: 43 Room: [facility] ENDO ROOM 1 Gender: Male Note Status: Finalized Instrument Name: Upper Endoscope [id number] Procedure:             Upper GI endoscopy Indications:           For therapy of esophageal varices Providers:             Quintin Buckle DO, DO Referring MD:          Jolanda Nation (Referring MD) Medicines:             Monitored Anesthesia Care Complications:         No immediate complications. Estimated blood loss: None. Procedure:             Pre-Anesthesia Assessment:                        - Prior to the procedure, a History and Physical was                         performed, and patient medications and allergies were                         reviewed. The patient is competent. The risks and                         benefits of the procedure and the sedation options and                         risks were discussed with the patient. All questions                         were answered and informed consent was obtained.                         Patient identification and proposed procedure were                         verified by the physician, the nurse, the anesthetist                         and the technician in the endoscopy suite. Mental                         Status Examination: alert and oriented. Airway                         Examination: normal oropharyngeal airway and neck                         mobility. Respiratory Examination: clear to                         auscultation. CV Examination: RRR, no murmurs, no S3                         or S4. Prophylactic Antibiotics: The patient does not  require prophylactic antibiotics. Prior                         Anticoagulants: The patient has taken no anticoagulant                         or  antiplatelet agents. ASA Grade Assessment: III - A                         patient with severe systemic disease. After reviewing                         the risks and benefits, the patient was deemed in                         satisfactory condition to undergo the procedure. The                         anesthesia plan was to use monitored anesthesia care                         (MAC). Immediately prior to administration of                         medications, the patient was re-assessed for adequacy                         to receive sedatives. The heart rate, respiratory                         rate, oxygen saturations, blood pressure, adequacy of                         pulmonary ventilation, and response to care were                         monitored throughout the procedure. The physical                         status of the patient was re-assessed after the                         procedure.                        After obtaining informed consent, the endoscope was                         passed under direct vision. Throughout the procedure,                         the patient's blood pressure, pulse, and oxygen                         saturations were monitored continuously. The Endoscope                         was introduced through the mouth, and advanced to the  second part of duodenum. The upper GI endoscopy was                         accomplished without difficulty. The patient tolerated                         the procedure well. Findings:      The duodenal bulb, first portion of the duodenum and second portion of       the duodenum were normal. Estimated blood loss: none.      Mild portal hypertensive gastropathy was found in the gastric body.       Estimated blood loss: none.      The exam of the stomach was otherwise normal.      Grade II varices were found in the lower third of the esophagus. Fair       amount of scar tissue from inital banding as  inpatient in March. Small       red wale sign present, however, adjacent next to scar tissue. Suspect       would not be able to suction mucosa into cap. To prevent procedural       induced bleeding, EVL was not performed today.      He may be a TIPs candidate. He is seeing Hepatology next week. Estimated       blood loss: none.      The exam of the esophagus was otherwise normal. Impression:            - Normal duodenal bulb, first portion of the duodenum                         and second portion of the duodenum.                        - Portal hypertensive gastropathy.                        - Grade II esophageal varices.                        - No specimens collected. Recommendation:        - Patient has a contact number available for                         emergencies. The signs and symptoms of potential                         delayed complications were discussed with the patient.                         Return to normal activities tomorrow. Written                         discharge instructions were provided to the patient.                        - Discharge patient to home.                        - Resume previous diet.                        -  Continue present medications.                        - Repeat upper endoscopy in 3 months to evaluate the                         response to therapy.                        - Return to GI office as previously scheduled.                        - The findings and recommendations were discussed with                         the patient. Procedure Code(s):     --- Professional ---                        [phone], Esophagogastroduodenoscopy, flexible,                         transoral; diagnostic, including collection of                         specimen(s) by brushing or washing, when performed                         (separate procedure) Diagnosis Code(s):     --- Professional ---                        K76.6, Portal hypertension                         K31.89, Other diseases of stomach and duodenum                        I85.00, Esophageal varices without bleeding CPT copyright 2022 American Medical Association. All rights reserved. The codes documented in this report are preliminary and upon coder review may  be revised to meet current compliance requirements. Attending Participation:      I personally performed the entire procedure. Polo Brisk, DO Quintin Buckle DO, DO 06/03/2023 1:54:37 PM This report has been signed electronically. Number of Addenda: 0 Note Initiated On: 06/03/2023 1:24 PM Estimated Blood Loss:  Estimated blood loss: none.      [facility]

## 2023-06-03 NOTE — Transfer of Care (Signed)
 Immediate Anesthesia Transfer of Care Note  Patient: Robert Sellers  Procedure(s) Performed: EGD (ESOPHAGOGASTRODUODENOSCOPY)  Patient Location: PACU  Anesthesia Type:General  Level of Consciousness: awake, alert , oriented, and patient cooperative  Airway & Oxygen Therapy: Patient Spontanous Breathing and Patient connected to nasal cannula oxygen  Post-op Assessment: Report given to RN and Post -op Vital signs reviewed and stable  Post vital signs: Reviewed and stable  Last Vitals:  Vitals Value Taken Time  BP 116/74 06/03/23 1341  Temp 36.8 C 06/03/23 1341  Pulse 90 06/03/23 1343  Resp 25 06/03/23 1343  SpO2 98 % 06/03/23 1343  Vitals shown include unfiled device data.  Last Pain:  Vitals:   06/03/23 1341  TempSrc: Temporal  PainSc: Asleep       Pt awake, responsive and comfortable. VSS.   Complications: No notable events documented.

## 2023-06-03 NOTE — Anesthesia Preprocedure Evaluation (Signed)
 Anesthesia Evaluation  Patient identified by MRN, date of birth, ID band Patient awake  General Assessment Comment:  Active hematemesis. Of note, patient had a fall from seated position this morning, injuring his face and right arm. X rays of upper extremities with no evidence of fracture. Did not get his head scanned  Reviewed: Allergy & Precautions, NPO status , Patient's Chart, lab work & pertinent test results  History of Anesthesia Complications Negative for: history of anesthetic complications  Airway Mallampati: II  TM Distance: >3 FB Neck ROM: Full   Comment: Blood in beard Dental  (+) Teeth Intact, Dental Advidsory Given   Pulmonary neg pulmonary ROS, neg shortness of breath, neg sleep apnea, neg COPD, neg recent URI, Patient abstained from smoking.Not current smoker   Pulmonary exam normal breath sounds clear to auscultation       Cardiovascular Exercise Tolerance: Good METShypertension, Pt. on medications and Pt. on home beta blockers (-) angina (-) CAD and (-) Past MI (-) dysrhythmias  Rhythm:Regular Rate:Normal - Systolic murmurs    Neuro/Psych  PSYCHIATRIC DISORDERS Anxiety Depression    negative neurological ROS     GI/Hepatic ,neg GERD  ,,(+) Cirrhosis   Esophageal Varices and ascites  substance abuse  alcohol use  Endo/Other  neg diabetes    Renal/GU negative Renal ROS     Musculoskeletal   Abdominal Normal abdominal exam  (+)   Peds  Hematology  (+) Blood dyscrasia, anemia Thrombocytopenia    Anesthesia Other Findings Past Medical History: 12/04/2012: Hyperlipidemia No date: Hypertension  Reproductive/Obstetrics                              Anesthesia Physical Anesthesia Plan  ASA: 3  Anesthesia Plan: General   Post-op Pain Management: Minimal or no pain anticipated   Induction: Intravenous  PONV Risk Score and Plan: 2 and Treatment may vary due to age or  medical condition, Propofol  infusion and TIVA  Airway Management Planned: Natural Airway and Nasal Cannula  Additional Equipment: None  Intra-op Plan:   Post-operative Plan:   Informed Consent: I have reviewed the patients History and Physical, chart, labs and discussed the procedure including the risks, benefits and alternatives for the proposed anesthesia with the patient or authorized representative who has indicated his/her understanding and acceptance.     Dental advisory given  Plan Discussed with: CRNA and Surgeon  Anesthesia Plan Comments: (Discussed risks of anesthesia with patient, including PONV, sore throat, lip/dental/eye damage, aspiration. Rare risks discussed as well, such as cardiorespiratory and neurological sequelae, and allergic reactions. Discussed the role of CRNA in patient's perioperative care. Patient understands. Encouraged alcohol cessation, and finding support and treatment options of alcoholism.)        Anesthesia Quick Evaluation

## 2023-06-06 NOTE — Anesthesia Postprocedure Evaluation (Signed)
 Anesthesia Post Note  Patient: Robert Sellers  Procedure(s) Performed: EGD (ESOPHAGOGASTRODUODENOSCOPY)  Patient location during evaluation: PACU Anesthesia Type: General Level of consciousness: awake and alert Pain management: pain level controlled Vital Signs Assessment: post-procedure vital signs reviewed and stable Respiratory status: spontaneous breathing, nonlabored ventilation and respiratory function stable Cardiovascular status: blood pressure returned to baseline and stable Postop Assessment: no apparent nausea or vomiting Anesthetic complications: no   No notable events documented.   Last Vitals:  Vitals:   06/03/23 1341 06/03/23 1401  BP: 116/74   Pulse: 68   Resp: 20 20  Temp: 36.8 C   SpO2: 98%     Last Pain:  Vitals:   06/03/23 1401  TempSrc:   PainSc: 0-No pain                 Baltazar Bonier

## 2023-06-07 DIAGNOSIS — K766 Portal hypertension: Secondary | ICD-10-CM | POA: Diagnosis not present

## 2023-06-07 DIAGNOSIS — F1020 Alcohol dependence, uncomplicated: Secondary | ICD-10-CM | POA: Diagnosis not present

## 2023-06-07 DIAGNOSIS — K7460 Unspecified cirrhosis of liver: Secondary | ICD-10-CM | POA: Diagnosis not present

## 2023-06-07 DIAGNOSIS — I8511 Secondary esophageal varices with bleeding: Secondary | ICD-10-CM | POA: Diagnosis not present

## 2023-06-07 DIAGNOSIS — F1090 Alcohol use, unspecified, uncomplicated: Secondary | ICD-10-CM | POA: Diagnosis not present

## 2023-06-07 DIAGNOSIS — R188 Other ascites: Secondary | ICD-10-CM | POA: Diagnosis not present

## 2023-06-08 ENCOUNTER — Other Ambulatory Visit: Payer: Self-pay | Admitting: General Practice

## 2023-06-08 ENCOUNTER — Other Ambulatory Visit (HOSPITAL_COMMUNITY): Payer: Self-pay

## 2023-06-08 NOTE — Telephone Encounter (Signed)
 L- 05/09/23 N- not scheduled R- 05/13/23 #30/0

## 2023-06-09 ENCOUNTER — Other Ambulatory Visit (HOSPITAL_COMMUNITY): Payer: Self-pay

## 2023-06-10 ENCOUNTER — Other Ambulatory Visit (HOSPITAL_COMMUNITY): Payer: Self-pay

## 2023-06-10 NOTE — Telephone Encounter (Signed)
 Lvmtcb, sent mychart message

## 2023-06-10 NOTE — Telephone Encounter (Signed)
 Per Donnel Gail; patient needs appt. Please call to schedule before refill will be done.

## 2023-06-13 NOTE — Telephone Encounter (Signed)
 lvm for pt to call office to schedule appt.

## 2023-06-14 ENCOUNTER — Other Ambulatory Visit (HOSPITAL_COMMUNITY): Payer: Self-pay

## 2023-06-14 DIAGNOSIS — F1020 Alcohol dependence, uncomplicated: Secondary | ICD-10-CM | POA: Insufficient documentation

## 2023-06-14 NOTE — Telephone Encounter (Signed)
 lvm for pt to call office to schedule appt.

## 2023-06-15 ENCOUNTER — Other Ambulatory Visit (HOSPITAL_COMMUNITY): Payer: Self-pay

## 2023-06-22 ENCOUNTER — Encounter: Payer: Self-pay | Admitting: General Practice

## 2023-06-22 ENCOUNTER — Ambulatory Visit (INDEPENDENT_AMBULATORY_CARE_PROVIDER_SITE_OTHER): Admitting: General Practice

## 2023-06-22 VITALS — BP 114/80 | HR 68 | Temp 98.2°F | Ht 70.0 in | Wt 198.0 lb

## 2023-06-22 DIAGNOSIS — K7031 Alcoholic cirrhosis of liver with ascites: Secondary | ICD-10-CM

## 2023-06-22 DIAGNOSIS — M25511 Pain in right shoulder: Secondary | ICD-10-CM

## 2023-06-22 MED ORDER — GABAPENTIN 300 MG PO CAPS
300.0000 mg | ORAL_CAPSULE | Freq: Every day | ORAL | 2 refills | Status: DC | PRN
Start: 2023-06-22 — End: 2023-09-27

## 2023-06-22 NOTE — Assessment & Plan Note (Signed)
 Improving.  Discussed following with ortho.   Continue Gabapentin  300 mg at bedtime PRN.  Refill sent.

## 2023-06-22 NOTE — Patient Instructions (Signed)
 Continue Gabapentin  300 mg at bedtime as needed for pain and sleep.   I will see you in 6 months.   It was a pleasure to see you today!

## 2023-06-22 NOTE — Assessment & Plan Note (Signed)
 Reviewed notes, procedure notes and labs from [facility] and Dr. Mamie Searles in care everywhere.   Overall doing much better.  Commended on progress.   Will continue to monitor.

## 2023-06-22 NOTE — Progress Notes (Signed)
 Established Patient Office Visit  Subjective   Patient ID: Robert Sellers, male    DOB: 07/03/1980  Age: 43 y.o. MRN: [id number]  Chief Complaint  Patient presents with   Follow-up    Needs gabapentin  refilled    HPI  Robert Sellers is a 43 year old male with past medical history of Alcoholic cirrhosis of liver with ascites, insomnia, GAD, esophageal varices presents today for a follow up for medication refill.   Acute pain of right shoulder: since his fall in march, his chronic pain in the shoulder has returned. He has been taking Gabapentin  300 mg at bedtime which has been helping with the pain. He does follow with ortho. Pain is intermittent, which is throbbing and stabbing pain. The medication also does help with sleep. He would like a refill today.   Alcoholic cirrhosis of liver with ascites: has been following with [facility] for this. He has had therapy and consultation appointments. He has been taking his lasix 40 mg to help with the ascites. He does have shortness of breath at times but rarely. No concerns otherwise today. No ETOH since March. He had an endoscopy done with Dr. Mamie Searles on 06/03/23 which showed Grade II varices were found in the lower third of the esophagus. Fair amount of scar tissue from inital banding as inpatient in March. Small red wale sign present, however, adjacent next to scar tissue. Suspect would not be able to suction mucosa into cap. To prevent procedural induced bleeding, EVL was not performed today. He may be a TIPs candidate. Per gastro/liver specialist note on 06/07/23, schedule another endoscopy at [facility] in the next few weeks. Overall, patient reports he is doing better.   Patient Active Problem List   Diagnosis Date Noted   Anasarca 04/29/2023   Anxiety 04/29/2023   Hospital discharge follow-up 04/22/2023   Generalized abdominal pain 04/22/2023   Alcoholic cirrhosis of liver with ascites (HCC) 04/13/2023   Thrombocytopenia (HCC) 04/13/2023   Hyponatremia  04/13/2023   Overweight (BMI 25.0-29.9) 04/13/2023   Elevated liver function tests 04/13/2023   Wrist pain 04/13/2023   Acute pain of right shoulder 04/13/2023   Hematemesis with nausea 04/12/2023   Secondary esophageal varices without bleeding (HCC) 04/12/2023   Insomnia 03/18/2023   History of hypertension 03/18/2023   History of hyperlipidemia 03/18/2023   Acute blood loss anemia 10/01/2022   Alcohol abuse 09/30/2022   Bladder stones 06/04/2019   Past Medical History:  Diagnosis Date   Acute blood loss anemia 10/01/2022   Acute upper GI bleeding 09/29/2022   Anxiety    Ascites    Clotting disorder (HCC)    Depression    Hyperlipidemia 12/04/2012   Hypertension    Left shoulder pain 08/28/2015   Substance abuse (HCC)    Past Surgical History:  Procedure Laterality Date   ESOPHAGOGASTRODUODENOSCOPY N/A 04/12/2023   Procedure: EGD (ESOPHAGOGASTRODUODENOSCOPY);  Surgeon: Marnee Sink, MD;  Location: [facility] ENDOSCOPY;  Service: Endoscopy;  Laterality: N/A;   ESOPHAGOGASTRODUODENOSCOPY N/A 05/13/2023   Procedure: EGD (ESOPHAGOGASTRODUODENOSCOPY);  Surgeon: Quintin Buckle, DO;  Location: [facility] ENDOSCOPY;  Service: Gastroenterology;  Laterality: N/A;   ESOPHAGOGASTRODUODENOSCOPY N/A 06/03/2023   Procedure: EGD (ESOPHAGOGASTRODUODENOSCOPY);  Surgeon: Quintin Buckle, DO;  Location: [facility] ENDOSCOPY;  Service: Gastroenterology;  Laterality: N/A;   ESOPHAGOGASTRODUODENOSCOPY (EGD) WITH PROPOFOL  N/A 09/30/2022   Procedure: ESOPHAGOGASTRODUODENOSCOPY (EGD) WITH PROPOFOL ;  Surgeon: Toledo, Alphonsus Jeans, MD;  Location: [facility] ENDOSCOPY;  Service: Gastroenterology;  Laterality: N/A;   GASTRIC VARICES BANDING  04/12/2023  Procedure: BAND LIGATION, GASTRIC VARICES;  Surgeon: Marnee Sink, MD;  Location: [facility] ENDOSCOPY;  Service: Endoscopy;;   KNEE SURGERY     left shoulder surgery Left 2024   [facility]   No Known Allergies       06/22/2023   12:23 PM 05/09/2023   10:39 AM 04/29/2023    3:24  PM  Depression screen PHQ 2/9  Decreased Interest 2 2 2   Down, Depressed, Hopeless 1 2 2   PHQ - 2 Score 3 4 4   Altered sleeping 2 2 2   Tired, decreased energy 2 2 2   Change in appetite 2 2 2   Feeling bad or failure about yourself  1 1 1   Trouble concentrating 1 2 2   Moving slowly or fidgety/restless 0 0 0  Suicidal thoughts 0 0 0  PHQ-9 Score 11 13 13   Difficult doing work/chores Somewhat difficult Very difficult Very difficult       06/22/2023   12:23 PM 05/09/2023   10:39 AM 04/29/2023    3:24 PM 04/22/2023    2:38 PM  GAD 7 : Generalized Anxiety Score  Nervous, Anxious, on Edge 1 2 2 2   Control/stop worrying 1 1 1 1   Worry too much - different things 1 1 1 1   Trouble relaxing 1 2 2 2   Restless 1 1 1 1   Easily annoyed or irritable 1 2 2 2   Afraid - awful might happen 0 0 0 1  Total GAD 7 Score 6 9 9 10   Anxiety Difficulty Somewhat difficult Very difficult Very difficult Very difficult      Review of Systems  Constitutional:  Negative for chills and fever.  Respiratory:  Negative for shortness of breath.   Cardiovascular:  Negative for chest pain.  Gastrointestinal:  Negative for abdominal pain, constipation, diarrhea, heartburn, nausea and vomiting.  Genitourinary:  Negative for dysuria, frequency and urgency.  Neurological:  Negative for dizziness and headaches.  Endo/Heme/Allergies:  Negative for polydipsia.  Psychiatric/Behavioral:  Negative for depression and suicidal ideas. The patient is not nervous/anxious.       Objective:     BP 114/80 (BP Location: Left Arm, Patient Position: Sitting, Cuff Size: Normal)   Pulse 68   Temp 98.2 F (36.8 C) (Oral)   Ht 5\' 10"  (1.778 m)   Wt 198 lb (89.8 kg)   SpO2 99%   BMI 28.41 kg/m  BP Readings from Last 3 Encounters:  06/22/23 114/80  06/03/23 116/74  05/17/23 115/75   Wt Readings from Last 3 Encounters:  06/22/23 198 lb (89.8 kg)  06/03/23 192 lb (87.1 kg)  05/17/23 231 lb 3.2 oz (104.9 kg)      Physical  Exam Vitals and nursing note reviewed.  Constitutional:      Appearance: Normal appearance.  Cardiovascular:     Rate and Rhythm: Normal rate and regular rhythm.     Pulses: Normal pulses.     Heart sounds: Normal heart sounds.  Pulmonary:     Effort: Pulmonary effort is normal.     Breath sounds: Normal breath sounds.  Neurological:     Mental Status: He is alert and oriented to person, place, and time.  Psychiatric:        Mood and Affect: Mood normal.        Behavior: Behavior normal.        Thought Content: Thought content normal.        Judgment: Judgment normal.      No results found for any visits  on 06/22/23.     The ASCVD Risk score (Arnett DK, et al., 2019) failed to calculate for the following reasons:   The valid HDL cholesterol range is 20 to 100 mg/dL   The valid total cholesterol range is 130 to 320 mg/dL    Assessment & Plan:  Acute pain of right shoulder Assessment & Plan: Improving.  Discussed following with ortho.   Continue Gabapentin  300 mg at bedtime PRN.  Refill sent.   Orders: -     Gabapentin ; Take 1 capsule (300 mg total) by mouth daily as needed.  Dispense: 30 capsule; Refill: 2  Alcoholic cirrhosis of liver with ascites [facility]) Assessment & Plan: Reviewed notes, procedure notes and labs from [facility] and Dr. Mamie Searles in care everywhere.   Overall doing much better.  Commended on progress.   Will continue to monitor.      Return in about 6 months (around 12/23/2023) for chronic care management.    Jolanda Nation, NP

## 2023-06-23 DIAGNOSIS — S46011A Strain of muscle(s) and tendon(s) of the rotator cuff of right shoulder, initial encounter: Secondary | ICD-10-CM | POA: Diagnosis not present

## 2023-06-23 DIAGNOSIS — S43431A Superior glenoid labrum lesion of right shoulder, initial encounter: Secondary | ICD-10-CM | POA: Diagnosis not present

## 2023-06-23 DIAGNOSIS — F1020 Alcohol dependence, uncomplicated: Secondary | ICD-10-CM | POA: Diagnosis not present

## 2023-06-28 DIAGNOSIS — F1020 Alcohol dependence, uncomplicated: Secondary | ICD-10-CM | POA: Diagnosis not present

## 2023-07-05 DIAGNOSIS — I8500 Esophageal varices without bleeding: Secondary | ICD-10-CM | POA: Diagnosis not present

## 2023-07-05 DIAGNOSIS — K766 Portal hypertension: Secondary | ICD-10-CM | POA: Diagnosis not present

## 2023-07-05 DIAGNOSIS — K7460 Unspecified cirrhosis of liver: Secondary | ICD-10-CM | POA: Diagnosis not present

## 2023-07-05 DIAGNOSIS — K7031 Alcoholic cirrhosis of liver with ascites: Secondary | ICD-10-CM | POA: Diagnosis not present

## 2023-07-05 DIAGNOSIS — K219 Gastro-esophageal reflux disease without esophagitis: Secondary | ICD-10-CM | POA: Diagnosis not present

## 2023-07-05 DIAGNOSIS — I8510 Secondary esophageal varices without bleeding: Secondary | ICD-10-CM | POA: Diagnosis not present

## 2023-07-05 DIAGNOSIS — K3189 Other diseases of stomach and duodenum: Secondary | ICD-10-CM | POA: Diagnosis not present

## 2023-07-05 DIAGNOSIS — F1020 Alcohol dependence, uncomplicated: Secondary | ICD-10-CM | POA: Diagnosis not present

## 2023-07-05 DIAGNOSIS — I10 Essential (primary) hypertension: Secondary | ICD-10-CM | POA: Diagnosis not present

## 2023-07-05 DIAGNOSIS — E785 Hyperlipidemia, unspecified: Secondary | ICD-10-CM | POA: Diagnosis not present

## 2023-07-05 DIAGNOSIS — F419 Anxiety disorder, unspecified: Secondary | ICD-10-CM | POA: Diagnosis not present

## 2023-07-06 ENCOUNTER — Other Ambulatory Visit: Payer: Self-pay | Admitting: Gastroenterology

## 2023-07-06 ENCOUNTER — Encounter: Payer: Self-pay | Admitting: General Practice

## 2023-07-06 DIAGNOSIS — K7031 Alcoholic cirrhosis of liver with ascites: Secondary | ICD-10-CM

## 2023-07-06 MED ORDER — ALBUMIN HUMAN 25 % IV SOLN: 25.0000 g | Freq: Once | INTRAVENOUS | Status: DC | PRN

## 2023-07-07 DIAGNOSIS — F1020 Alcohol dependence, uncomplicated: Secondary | ICD-10-CM | POA: Diagnosis not present

## 2023-07-08 ENCOUNTER — Encounter: Payer: Self-pay | Admitting: General Practice

## 2023-07-08 ENCOUNTER — Ambulatory Visit
Admission: RE | Admit: 2023-07-08 | Discharge: 2023-07-08 | Disposition: A | Discharge status: Home / Self Care | Source: Ambulatory Visit | Attending: Gastroenterology | Admitting: Gastroenterology

## 2023-07-08 DIAGNOSIS — K7290 Hepatic failure, unspecified without coma: Secondary | ICD-10-CM | POA: Diagnosis not present

## 2023-07-08 DIAGNOSIS — R188 Other ascites: Secondary | ICD-10-CM | POA: Diagnosis not present

## 2023-07-08 DIAGNOSIS — K7031 Alcoholic cirrhosis of liver with ascites: Secondary | ICD-10-CM | POA: Diagnosis not present

## 2023-07-08 DIAGNOSIS — K7460 Unspecified cirrhosis of liver: Secondary | ICD-10-CM | POA: Diagnosis not present

## 2023-07-08 LAB — ALBUMIN, PLEURAL OR PERITONEAL FLUID: Albumin, Fluid: <1.5 g/dL

## 2023-07-08 LAB — BODY FLUID CELL COUNT WITH DIFFERENTIAL
Eos, Fluid: 0 %
Lymphs, Fluid: 32 %
Monocyte-Macrophage-Serous Fluid: 66 %
Neutrophil Count, Fluid: 2 %
Total Nucleated Cell Count, Fluid: 98 uL

## 2023-07-08 LAB — PROTEIN, PLEURAL OR PERITONEAL FLUID: Total protein, fluid: <3 g/dL

## 2023-07-08 LAB — PATHOLOGIST SMEAR REVIEW

## 2023-07-08 MED ORDER — LIDOCAINE HCL (PF) 1 % IJ SOLN
5.0000 mL | Freq: Once | INTRAMUSCULAR | Status: AC
Start: 2023-07-08 — End: 2023-07-08
  Administered 2023-07-08: 5 mL via INTRADERMAL

## 2023-07-08 NOTE — Procedures (Signed)
 PROCEDURE SUMMARY:  Successful US  guided paracentesis from right lateral abdomen.  Yielded 4.9 liters of clear, yellow fluid.  No immediate complications.  Pt tolerated well.   Specimen was sent for labs.  EBL < 5mL  Lillian Rein PA-C 07/08/2023 9:08 AM

## 2023-07-08 NOTE — Discharge Instructions (Signed)
 Reviewed w/ pt

## 2023-07-11 LAB — BODY FLUID CULTURE W GRAM STAIN
Culture: NO GROWTH
Gram Stain: NONE SEEN

## 2023-07-14 DIAGNOSIS — K7031 Alcoholic cirrhosis of liver with ascites: Secondary | ICD-10-CM | POA: Diagnosis not present

## 2023-07-19 ENCOUNTER — Encounter (HOSPITAL_BASED_OUTPATIENT_CLINIC_OR_DEPARTMENT_OTHER): Payer: Self-pay

## 2023-07-19 ENCOUNTER — Emergency Department (HOSPITAL_BASED_OUTPATIENT_CLINIC_OR_DEPARTMENT_OTHER): Admitting: Radiology

## 2023-07-19 ENCOUNTER — Other Ambulatory Visit: Payer: Self-pay

## 2023-07-19 DIAGNOSIS — S90935A Unspecified superficial injury of left lesser toe(s), initial encounter: Secondary | ICD-10-CM | POA: Diagnosis not present

## 2023-07-19 DIAGNOSIS — I10 Essential (primary) hypertension: Secondary | ICD-10-CM | POA: Diagnosis not present

## 2023-07-19 DIAGNOSIS — S99922A Unspecified injury of left foot, initial encounter: Secondary | ICD-10-CM | POA: Diagnosis not present

## 2023-07-19 DIAGNOSIS — W208XXA Other cause of strike by thrown, projected or falling object, initial encounter: Secondary | ICD-10-CM | POA: Diagnosis not present

## 2023-07-19 DIAGNOSIS — S91312A Laceration without foreign body, left foot, initial encounter: Secondary | ICD-10-CM | POA: Diagnosis not present

## 2023-07-19 MED ORDER — OXYCODONE-ACETAMINOPHEN 5-325 MG PO TABS
1.0000 | ORAL_TABLET | ORAL | Status: DC | PRN
  Administered 2023-07-19: 1 via ORAL
  Filled 2023-07-19: qty 1

## 2023-07-19 NOTE — ED Triage Notes (Signed)
 Pt coming in after large piece of cast iron fell about 15 feet onto patients L foot in between big toe, 2nd, and 3rd toe. Arrived with wound covered in gauze. Bleeding controlled at this time. L foot warm, able to move all toes, slight sensation difference from the R foot.

## 2023-07-20 ENCOUNTER — Emergency Department (HOSPITAL_BASED_OUTPATIENT_CLINIC_OR_DEPARTMENT_OTHER)
Admission: EM | Admit: 2023-07-20 | Discharge: 2023-07-20 | Disposition: A | Discharge status: Home / Self Care | Attending: Emergency Medicine | Admitting: Emergency Medicine

## 2023-07-20 DIAGNOSIS — S91312A Laceration without foreign body, left foot, initial encounter: Secondary | ICD-10-CM

## 2023-07-20 MED ORDER — HYDROMORPHONE HCL 1 MG/ML IJ SOLN
1.0000 mg | Freq: Once | INTRAMUSCULAR | Status: AC
  Administered 2023-07-20: 1 mg via INTRAMUSCULAR
  Filled 2023-07-20: qty 1

## 2023-07-20 MED ORDER — LIDOCAINE HCL (PF) 1 % IJ SOLN
10.0000 mL | Freq: Once | INTRAMUSCULAR | Status: AC
  Administered 2023-07-20: 10 mL
  Filled 2023-07-20: qty 10

## 2023-07-20 MED ORDER — CEPHALEXIN 500 MG PO CAPS
500.0000 mg | ORAL_CAPSULE | Freq: Three times a day (TID) | ORAL | 0 refills | Status: AC
Start: 2023-07-20 — End: 2023-07-25

## 2023-07-20 NOTE — ED Provider Notes (Signed)
 [facility] Emergency Department Provider Note MRN:  [id number]  Arrival date & time: 07/20/23     Chief Complaint   Toe Injury   History of Present Illness   Robert Sellers is a 43 y.o. year-old male with no pertinent past medical history presenting to the ED with chief complaint of no injury.  Toe injury and laceration from a large metal pipe falling on his foot.  Lots of bleeding at home.  No other injuries.  Review of Systems  A thorough review of systems was obtained and all systems are negative except as noted in the HPI and PMH.   Patient's Health History    Past Medical History:  Diagnosis Date   Acute blood loss anemia 10/01/2022   Acute upper GI bleeding 09/29/2022   Anxiety    Ascites    Clotting disorder (HCC)    Depression    Hyperlipidemia 12/04/2012   Hypertension    Left shoulder pain 08/28/2015   Substance abuse [facility])     Past Surgical History:  Procedure Laterality Date   ESOPHAGOGASTRODUODENOSCOPY N/A 04/12/2023   Procedure: EGD (ESOPHAGOGASTRODUODENOSCOPY);  Surgeon: Marnee Sink, MD;  Location: [facility] ENDOSCOPY;  Service: Endoscopy;  Laterality: N/A;   ESOPHAGOGASTRODUODENOSCOPY N/A 05/13/2023   Procedure: EGD (ESOPHAGOGASTRODUODENOSCOPY);  Surgeon: Quintin Buckle, DO;  Location: [facility] ENDOSCOPY;  Service: Gastroenterology;  Laterality: N/A;   ESOPHAGOGASTRODUODENOSCOPY N/A 06/03/2023   Procedure: EGD (ESOPHAGOGASTRODUODENOSCOPY);  Surgeon: Quintin Buckle, DO;  Location: [facility] ENDOSCOPY;  Service: Gastroenterology;  Laterality: N/A;   ESOPHAGOGASTRODUODENOSCOPY (EGD) WITH PROPOFOL  N/A 09/30/2022   Procedure: ESOPHAGOGASTRODUODENOSCOPY (EGD) WITH PROPOFOL ;  Surgeon: Toledo, Alphonsus Jeans, MD;  Location: [facility] ENDOSCOPY;  Service: Gastroenterology;  Laterality: N/A;   GASTRIC VARICES BANDING  04/12/2023   Procedure: BAND LIGATION, GASTRIC VARICES;  Surgeon: Marnee Sink, MD;  Location: [facility] ENDOSCOPY;  Service: Endoscopy;;   KNEE  SURGERY     left shoulder surgery Left 2024   [facility]    Family History  Problem Relation Age of Onset   Heart disease Mother    Hyperlipidemia Mother    Hypertension Mother    Diabetes Mother    Early death Mother    Obesity Mother    Heart disease Father    Hyperlipidemia Father    Hypertension Father    Diabetes Father    Alcohol abuse Father    Early death Father    Kidney disease Father    Obesity Father     Social History   Socioeconomic History   Marital status: Divorced    Spouse name: Not on file   Number of children: 3   Years of education: Not on file   Highest education level: Master's degree (e.g., MA, MS, MEng, MEd, MSW, MBA)  Occupational History   Not on file  Tobacco Use   Smoking status: Never   Smokeless tobacco: Never  Vaping Use   Vaping status: Never Used  Substance and Sexual Activity   Alcohol use: Not Currently    Alcohol/week: 1.0 standard drink of alcohol    Types: 1 Standard drinks or equivalent per week    Comment: consistent   Drug use: No   Sexual activity: Yes    Partners: Female    Birth control/protection: None    Comment: single partner  Other Topics Concern   Not on file  Social History Narrative   Not on file   Social Drivers of Health   Financial Resource Strain: Medium Risk (05/03/2023)  Received from [facility]   Overall Financial Resource Strain (CARDIA)    Difficulty of Paying Living Expenses: Somewhat hard  Food Insecurity: No Food Insecurity (05/03/2023)   Received from [facility]   Hunger Vital Sign    Worried About Running Out of Food in the Last Year: Never true    Ran Out of Food in the Last Year: Never true  Recent Concern: Food Insecurity - Food Insecurity Present (04/12/2023)   Hunger Vital Sign    Worried About Running Out of Food in the Last Year: Sometimes true    Ran Out of Food in the Last Year: Sometimes true  Transportation Needs: No Transportation Needs (05/03/2023)   Received from [facility]   PRAPARE - Transportation    Lack of Transportation (Medical): No    Lack of Transportation (Non-Medical): No  Physical Activity: Insufficiently Active (03/17/2023)   Exercise Vital Sign    Days of Exercise per Week: 3 days    Minutes of Exercise per Session: 30 min  Stress: Stress Concern Present (03/17/2023)   [employer] of Occupational Health - Occupational Stress Questionnaire    Feeling of Stress : Very much  Social Connections: Unknown (04/12/2023)   Social Connection and Isolation Panel    Frequency of Communication with Friends and Family: Once a week    Frequency of Social Gatherings with Friends and Family: Once a week    Attends Religious Services: Never    Database administrator or Organizations: No    Attends Engineer, structural: Never    Marital Status: Patient declined  Recent Concern: Social Connections - Socially Isolated (03/17/2023)   Social Connection and Isolation Panel    Frequency of Communication with Friends and Family: Once a week    Frequency of Social Gatherings with Friends and Family: Once a week    Attends Religious Services: Never    Database administrator or Organizations: No    Attends Engineer, structural: Not on file    Marital Status: Divorced  Intimate Partner Violence: Patient Unable To Answer (06/07/2023)   Received from [facility]   Humiliation, Afraid, Rape, and Kick questionnaire    Fear of Current or Ex-Partner: Patient unable to answer    Emotionally Abused: Patient unable to answer    Physically Abused: Patient unable to answer    Sexually Abused: Patient unable to answer     Physical Exam   Vitals:   07/20/23 0145 07/20/23 0200  BP: 118/82 118/81  Pulse:    Resp:    Temp:    SpO2:      CONSTITUTIONAL: Well-appearing, NAD NEURO/PSYCH:  Alert and oriented x 3, no focal deficits EYES:  eyes equal and reactive ENT/NECK:  no LAD, no JVD CARDIO: Regular rate, well-perfused, normal S1 and  S2 PULM:  CTAB no wheezing or rhonchi GI/GU:  non-distended, non-tender MSK/SPINE:  No gross deformities, no edema SKIN: Laceration between the 2nd and 3rd toe   *Additional and/or pertinent findings included in MDM below  Diagnostic and Interventional Summary    EKG Interpretation Date/Time:    Ventricular Rate:    PR Interval:    QRS Duration:    QT Interval:    QTC Calculation:   R Axis:      Text Interpretation:         Labs Reviewed - No data to display  DG Foot Complete Left  Final Result  Medications  oxyCODONE -acetaminophen  (PERCOCET/ROXICET) 5-325 MG per tablet 1 tablet (1 tablet Oral Given 07/19/23 2323)  lidocaine  (PF) (XYLOCAINE ) 1 % injection 10 mL (10 mLs Infiltration Given 07/20/23 0138)  HYDROmorphone  (DILAUDID ) injection 1 mg (1 mg Intramuscular Given 07/20/23 0138)     Procedures  /  Critical Care .Laceration Repair  Date/Time: 07/20/2023 2:55 AM  Performed by: Edson Graces, MD Authorized by: Edson Graces, MD   Consent:    Consent obtained:  Verbal   Consent given by:  Patient   Risks, benefits, and alternatives were discussed: yes     Risks discussed:  Infection, need for additional repair, nerve damage, poor wound healing, poor cosmetic result, pain, retained foreign body, tendon damage and vascular damage Universal protocol:    Procedure explained and questions answered to patient or proxy's satisfaction: yes     Immediately prior to procedure, a time out was called: yes     Patient identity confirmed:  Verbally with patient Anesthesia:    Anesthesia method:  Local infiltration   Local anesthetic:  Lidocaine  1% w/o epi Laceration details:    Location:  Foot   Length (cm):  4   Depth (mm):  4 Pre-procedure details:    Preparation:  Patient was prepped and draped in usual sterile fashion Exploration:    Limited defect created (wound extended): yes     Hemostasis achieved with:  Direct pressure   Imaging obtained: x-ray      Imaging outcome: foreign body not noted     Wound exploration: wound explored through full range of motion and entire depth of wound visualized     Wound extent: areolar tissue violated     Wound extent: fascia not violated, no foreign body, no signs of injury, no nerve damage, no tendon damage, no underlying fracture and no vascular damage     Contaminated: no   Treatment:    Area cleansed with:  Soap and water   Amount of cleaning:  Extensive   Undermining:  Minimal   Scar revision: no   Skin repair:    Repair method:  Sutures   Suture size:  4-0   Wound skin closure material used: vicryl.   Number of sutures:  10 Approximation:    Approximation:  Close Repair type:    Repair type:  Intermediate Post-procedure details:    Dressing:  Sterile dressing   Procedure completion:  Tolerated well, no immediate complications Comments:     Laceration between the 2nd and 3rd toe of the left foot extending from the plantar surface to the dorsal surface.   ED Course and Medical Decision Making  Initial Impression and Ddx Laceration between the 2nd and 3rd toe, other considerations would include fracture, foreign body.  Awaiting x-ray  Past medical/surgical history that increases complexity of ED encounter: Alcoholic cirrhosis  Interpretation of Diagnostics I personally reviewed the foot x-ray and my interpretation is as follows: No fracture    Patient Reassessment and Ultimate Disposition/Management     Laceration repaired as described above, appropriate for discharge.  Patient management required discussion with the following services or consulting groups:  None  Complexity of Problems Addressed Acute illness or injury that poses threat of life of bodily function  Additional Data Reviewed and Analyzed Further history obtained from: Further history from spouse/family member  Additional Factors Impacting ED Encounter Risk Minor Procedures and Use of parenteral controlled  substances  Siler Mavis M. Harless Lien, MD [facility] Emergency Medicine [facility] [email or web address]  Final Clinical Impressions(s) / ED Diagnoses     ICD-10-CM   1. Laceration of left foot, initial encounter  U98.119J       ED Discharge Orders          Ordered    cephALEXin (KEFLEX) 500 MG capsule  3 times daily        07/20/23 0259             Discharge Instructions Discussed with and Provided to Patient:     Discharge Instructions      You were evaluated in the Emergency Department and after careful evaluation, we did not find any emergent condition requiring admission or further testing in the hospital.  Your exam/testing today was overall reassuring.  We repaired your laceration here in the emergency department.  Stitches will not need to be removed.  Take the Keflex antibiotic to prevent infection.  Please return to the Emergency Department if you experience any worsening of your condition.  Thank you for allowing us  to be a part of your care.        Edson Graces, MD 07/20/23 [phone]

## 2023-07-20 NOTE — ED Notes (Signed)
 Wound cleaned and dressed. Wound care supplies given to patient.

## 2023-07-20 NOTE — Discharge Instructions (Addendum)
 You were evaluated in the Emergency Department and after careful evaluation, we did not find any emergent condition requiring admission or further testing in the hospital.  Your exam/testing today was overall reassuring.  We repaired your laceration here in the emergency department.  Stitches will not need to be removed.  Take the Keflex antibiotic to prevent infection.  Please return to the Emergency Department if you experience any worsening of your condition.  Thank you for allowing us  to be a part of your care.

## 2023-07-25 ENCOUNTER — Ambulatory Visit (INDEPENDENT_AMBULATORY_CARE_PROVIDER_SITE_OTHER): Admitting: General Practice

## 2023-07-25 ENCOUNTER — Encounter: Payer: Self-pay | Admitting: General Practice

## 2023-07-25 VITALS — BP 112/68 | HR 77 | Temp 97.9°F | Ht 70.0 in | Wt 194.0 lb

## 2023-07-25 DIAGNOSIS — S91312A Laceration without foreign body, left foot, initial encounter: Secondary | ICD-10-CM | POA: Insufficient documentation

## 2023-07-25 DIAGNOSIS — Z09 Encounter for follow-up examination after completed treatment for conditions other than malignant neoplasm: Secondary | ICD-10-CM

## 2023-07-25 DIAGNOSIS — S91312D Laceration without foreign body, left foot, subsequent encounter: Secondary | ICD-10-CM | POA: Diagnosis not present

## 2023-07-25 DIAGNOSIS — N529 Male erectile dysfunction, unspecified: Secondary | ICD-10-CM | POA: Insufficient documentation

## 2023-07-25 MED ORDER — SILDENAFIL CITRATE 25 MG PO TABS: 25.0000 mg | ORAL_TABLET | ORAL | 0 refills | Status: DC | PRN

## 2023-07-25 NOTE — Assessment & Plan Note (Signed)
 Uncontrolled.   Start Sildenafil 25 mg once daily PRN. Discussed side effects and monitoring closely especially as taken with spironolactone  as it can cause hypotension.  Verbalizes understanding.   Rx sent.

## 2023-07-25 NOTE — Patient Instructions (Addendum)
 Finish the antibiotics.   Monitor signs and symptoms of infection.   Start Sildenafil 25 mg as needed.   Schedule physical for mid August.   It was a pleasure to see you today!

## 2023-07-25 NOTE — Assessment & Plan Note (Addendum)
 Reviewed hospital notes, imaging and labs.

## 2023-07-25 NOTE — Progress Notes (Signed)
 Established Patient Office Visit  Subjective   Patient ID: Robert Sellers, male    DOB: [birth year]  Age: 43 y.o. MRN: [id number]  Chief Complaint  Patient presents with   Laceration    On left foot since 07/03/23; had 10 sutures put in at ED. Patient here today to have rechecked and re-dressed if needed. Patient needs work note to be put on light/desk duty until foot heals.    Medication Refill    Patient has been on sildenafil in the past and wants to discuss going back on it if theres not interference with his other medications. Also, requests rx sent in for hibiclens wash.    Laceration   Medication Refill Pertinent negatives include no abdominal pain, chest pain, chills, fever, headaches, nausea or vomiting.   Robert Sellers is a 43 year old male with past medical history of Alcoholic cirrhosis of liver with ascites, insomnia, GAD, esophageal varices presents today for a hospital follow up for laceration on foot and medication refill.   Laceration on left foot: patient was evaluated at [facility] ER on 07/20/23 for a toe injury and laceration between the 2nd and 3rd toe extending from the plantar surface to the dorsal surface from a large metal pipe falling on his foot. He reported a lot of bleeding at home and no other injuries. While at the ER, his wound was washed with soap and water and 10 sutures were placed. He was told that he did not need the sutures removed. He was given keflex 500 mg TID for five days to prevent infection. 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 He is almost done with the antibiotics. He does have pain when he is standing for prolonged period of time. His friend gave him a special boot to wear while his sutures heal up. No fevers, no chills. He had a foot x-ray which did not show any fracture, join location or focal osseous lesion.   Erectile dysfunction: diagnosed many years ago. He suspects this could be secondary to the  spironolactone . He denies any chest pain, shortness of breath or difficulty breathing.  Previously treated with Sildenafil in the past and tolerated it well with no side effects.    Patient Active Problem List   Diagnosis Date Noted   Laceration of left foot 07/25/2023   Erectile dysfunction 07/25/2023   Alcohol use disorder, severe, dependence (HCC) 06/14/2023   Anasarca 04/29/2023   Anxiety 04/29/2023   Hospital discharge follow-up 04/22/2023   Generalized abdominal pain 04/22/2023   Alcoholic cirrhosis of liver with ascites (HCC) 04/13/2023   Thrombocytopenia (HCC) 04/13/2023   Hyponatremia 04/13/2023   Overweight (BMI 25.0-29.9) 04/13/2023   Elevated liver function tests 04/13/2023   Wrist pain 04/13/2023   Acute pain of right shoulder 04/13/2023   Hematemesis with nausea 04/12/2023   Secondary esophageal varices without bleeding (HCC) 04/12/2023   Insomnia 03/18/2023   History of hypertension 03/18/2023   History of hyperlipidemia 03/18/2023   Acute blood loss anemia 10/01/2022   Alcohol abuse 09/30/2022   Bladder stones 06/04/2019   Past Medical History:  Diagnosis Date   Acute blood loss anemia 10/01/2022   Acute upper GI bleeding 09/29/2022   Anxiety    Ascites    Clotting disorder (HCC)    Depression    Hyperlipidemia 12/04/2012   Hypertension    Left shoulder pain 08/28/2015   Substance abuse [facility])    Past Surgical  History:  Procedure Laterality Date   ESOPHAGOGASTRODUODENOSCOPY N/A 04/12/2023   Procedure: EGD (ESOPHAGOGASTRODUODENOSCOPY);  Surgeon: Jinny Carmine, MD;  Location: [facility] ENDOSCOPY;  Service: Endoscopy;  Laterality: N/A;   ESOPHAGOGASTRODUODENOSCOPY N/A 05/13/2023   Procedure: EGD (ESOPHAGOGASTRODUODENOSCOPY);  Surgeon: Onita Elspeth Sharper, DO;  Location: [facility] ENDOSCOPY;  Service: Gastroenterology;  Laterality: N/A;   ESOPHAGOGASTRODUODENOSCOPY N/A 06/03/2023   Procedure: EGD (ESOPHAGOGASTRODUODENOSCOPY);  Surgeon: Onita Elspeth Sharper, DO;   Location: [facility] ENDOSCOPY;  Service: Gastroenterology;  Laterality: N/A;   ESOPHAGOGASTRODUODENOSCOPY (EGD) WITH PROPOFOL  N/A 09/30/2022   Procedure: ESOPHAGOGASTRODUODENOSCOPY (EGD) WITH PROPOFOL ;  Surgeon: Toledo, Ladell POUR, MD;  Location: [facility] ENDOSCOPY;  Service: Gastroenterology;  Laterality: N/A;   GASTRIC VARICES BANDING  04/12/2023   Procedure: BAND LIGATION, GASTRIC VARICES;  Surgeon: Jinny Carmine, MD;  Location: [facility] ENDOSCOPY;  Service: Endoscopy;;   KNEE SURGERY     left shoulder surgery Left 2024   [facility]   No Known Allergies       07/25/2023   10:54 AM 06/22/2023   12:23 PM 05/09/2023   10:39 AM  Depression screen PHQ 2/9  Decreased Interest 1 2 2   Down, Depressed, Hopeless 1 1 2   PHQ - 2 Score 2 3 4   Altered sleeping 2 2 2   Tired, decreased energy 1 2 2   Change in appetite 2 2 2   Feeling bad or failure about yourself  0 1 1  Trouble concentrating 0 1 2  Moving slowly or fidgety/restless 1 0 0  Suicidal thoughts 0 0 0  PHQ-9 Score 8 11 13   Difficult doing work/chores Somewhat difficult Somewhat difficult Very difficult       07/25/2023   10:54 AM 06/22/2023   12:23 PM 05/09/2023   10:39 AM 04/29/2023    3:24 PM  GAD 7 : Generalized Anxiety Score  Nervous, Anxious, on Edge 1 1 2 2   Control/stop worrying 0 1 1 1   Worry too much - different things 1 1 1 1   Trouble relaxing 2 1 2 2   Restless 1 1 1 1   Easily annoyed or irritable 1 1 2 2   Afraid - awful might happen 0 0 0 0  Total GAD 7 Score 6 6 9 9   Anxiety Difficulty Somewhat difficult Somewhat difficult Very difficult Very difficult      Review of Systems  Constitutional:  Negative for chills and fever.  Respiratory:  Negative for shortness of breath.   Cardiovascular:  Negative for chest pain.  Gastrointestinal:  Negative for abdominal pain, constipation, diarrhea, heartburn, nausea and vomiting.  Genitourinary:  Negative for dysuria, frequency and urgency.  Musculoskeletal:  Positive for joint pain.       Foot  pain on 2nd digit.  Neurological:  Negative for dizziness and headaches.  Endo/Heme/Allergies:  Negative for polydipsia.  Psychiatric/Behavioral:  Negative for depression and suicidal ideas. The patient is not nervous/anxious.       Objective:     BP 112/68   Pulse 77   Temp 97.9 F (36.6 C) (Oral)   Ht 5' 10 (1.778 m)   Wt 194 lb (88 kg)   SpO2 98%   BMI 27.84 kg/m  BP Readings from Last 3 Encounters:  07/25/23 112/68  07/20/23 115/78  07/08/23 122/71   Wt Readings from Last 3 Encounters:  07/25/23 194 lb (88 kg)  07/19/23 200 lb (90.7 kg)  06/22/23 198 lb (89.8 kg)      Physical Exam Vitals and nursing note reviewed.  Constitutional:      Appearance: Normal  appearance.   Cardiovascular:     Rate and Rhythm: Normal rate and regular rhythm.     Pulses: Normal pulses.     Heart sounds: Normal heart sounds.  Pulmonary:     Effort: Pulmonary effort is normal.     Breath sounds: Normal breath sounds.   Musculoskeletal:       Feet:  Feet:     Comments: Sutures closed; no sign of infection.   Neurological:     Mental Status: He is alert and oriented to person, place, and time.   Psychiatric:        Mood and Affect: Mood normal.        Behavior: Behavior normal.        Thought Content: Thought content normal.        Judgment: Judgment normal.      No results found for any visits on 07/25/23.     The ASCVD Risk score (Arnett DK, et al., 2019) failed to calculate for the following reasons:   The valid HDL cholesterol range is 20 to 100 mg/dL   The valid total cholesterol range is 130 to 320 mg/dL    Assessment & Plan:  Laceration of left foot, subsequent encounter Assessment & Plan: Improving. Advised patient to finish Keflex. Limited ROM in 2nd and 3rd digit of left foot. Discussed systemic signs of infection as well as localized infection. Verbalizes understanding. Wound cleaned with soap and water, applied triple antibiotic ointment and  covered with gauze.  Work note given to return back to work on 08/08/2023. He will update if symptoms worsen or do not improve.   Erectile dysfunction, unspecified erectile dysfunction type Assessment & Plan: Uncontrolled.   Start Sildenafil 25 mg once daily PRN. Discussed side effects and monitoring closely especially as taken with spironolactone  as it can cause hypotension.  Verbalizes understanding.   Rx sent.   Orders: -     Sildenafil Citrate; Take 1 tablet (25 mg total) by mouth as needed for erectile dysfunction.  Dispense: 30 tablet; Refill: 0  Hospital discharge follow-up Assessment & Plan: Reviewed hospital notes, imaging and labs.    Return in about 2 months (around 09/24/2023) for physical and labs.    Carrol Aurora, NP

## 2023-07-25 NOTE — Assessment & Plan Note (Addendum)
 Improving. Advised patient to finish Keflex. Limited ROM in 2nd and 3rd digit of left foot. Discussed systemic signs of infection as well as localized infection. Verbalizes understanding. Wound cleaned with soap and water, applied triple antibiotic ointment and covered with gauze.  Work note given to return back to work on 08/08/2023. He will update if symptoms worsen or do not improve.

## 2023-07-26 DIAGNOSIS — F1020 Alcohol dependence, uncomplicated: Secondary | ICD-10-CM | POA: Diagnosis not present

## 2023-07-30 ENCOUNTER — Encounter: Payer: Self-pay | Admitting: General Practice

## 2023-07-30 DIAGNOSIS — X58XXXA Exposure to other specified factors, initial encounter: Secondary | ICD-10-CM | POA: Diagnosis not present

## 2023-07-30 DIAGNOSIS — S91312D Laceration without foreign body, left foot, subsequent encounter: Secondary | ICD-10-CM

## 2023-07-30 DIAGNOSIS — L03116 Cellulitis of left lower limb: Secondary | ICD-10-CM | POA: Diagnosis not present

## 2023-07-30 DIAGNOSIS — M79672 Pain in left foot: Secondary | ICD-10-CM | POA: Diagnosis not present

## 2023-08-01 DIAGNOSIS — W228XXA Striking against or struck by other objects, initial encounter: Secondary | ICD-10-CM | POA: Diagnosis not present

## 2023-08-01 DIAGNOSIS — M7989 Other specified soft tissue disorders: Secondary | ICD-10-CM | POA: Diagnosis not present

## 2023-08-01 DIAGNOSIS — R0600 Dyspnea, unspecified: Secondary | ICD-10-CM | POA: Diagnosis not present

## 2023-08-01 DIAGNOSIS — S91312A Laceration without foreign body, left foot, initial encounter: Secondary | ICD-10-CM | POA: Diagnosis not present

## 2023-08-01 DIAGNOSIS — S91312D Laceration without foreign body, left foot, subsequent encounter: Secondary | ICD-10-CM | POA: Diagnosis not present

## 2023-08-01 DIAGNOSIS — Y990 Civilian activity done for income or pay: Secondary | ICD-10-CM | POA: Diagnosis not present

## 2023-08-01 DIAGNOSIS — Y9289 Other specified places as the place of occurrence of the external cause: Secondary | ICD-10-CM | POA: Diagnosis not present

## 2023-08-01 NOTE — Telephone Encounter (Signed)
 Copied from CRM [id number]. Topic: Referral - Request for Referral >> Aug 01, 2023  8:04 AM [place] E wrote: Did the patient discuss referral with their provider in the last year? Yes (If No - schedule appointment) (If Yes - send message)  Appointment offered? No  Type of order/referral and detailed reason for visit: Wound care. Patient went to [facility] on Friday for an infected foot and they advised patient to have PCP write a wound care referral.  Preference of office, provider, location: [facility].  If referral order, have you been seen by this specialty before? No (If Yes, this issue or another issue? When? Where?  Can we respond through MyChart? Yes

## 2023-08-01 NOTE — Addendum Note (Signed)
 Addended by: VINCENTE SHIVERS on: 08/01/2023 12:14 PM   Modules accepted: Orders

## 2023-08-02 DIAGNOSIS — S91302A Unspecified open wound, left foot, initial encounter: Secondary | ICD-10-CM | POA: Diagnosis not present

## 2023-08-02 DIAGNOSIS — W208XXA Other cause of strike by thrown, projected or falling object, initial encounter: Secondary | ICD-10-CM | POA: Diagnosis not present

## 2023-08-02 DIAGNOSIS — S91332A Puncture wound without foreign body, left foot, initial encounter: Secondary | ICD-10-CM | POA: Diagnosis not present

## 2023-08-02 DIAGNOSIS — K766 Portal hypertension: Secondary | ICD-10-CM | POA: Diagnosis not present

## 2023-08-02 DIAGNOSIS — K7030 Alcoholic cirrhosis of liver without ascites: Secondary | ICD-10-CM | POA: Diagnosis not present

## 2023-08-02 DIAGNOSIS — K7031 Alcoholic cirrhosis of liver with ascites: Secondary | ICD-10-CM | POA: Diagnosis not present

## 2023-08-02 DIAGNOSIS — R0600 Dyspnea, unspecified: Secondary | ICD-10-CM | POA: Diagnosis not present

## 2023-08-03 DIAGNOSIS — M65072 Abscess of tendon sheath, left ankle and foot: Secondary | ICD-10-CM | POA: Diagnosis not present

## 2023-08-03 DIAGNOSIS — S81809A Unspecified open wound, unspecified lower leg, initial encounter: Secondary | ICD-10-CM | POA: Diagnosis not present

## 2023-08-03 DIAGNOSIS — R0600 Dyspnea, unspecified: Secondary | ICD-10-CM | POA: Diagnosis not present

## 2023-08-04 DIAGNOSIS — R0600 Dyspnea, unspecified: Secondary | ICD-10-CM | POA: Diagnosis not present

## 2023-08-04 DIAGNOSIS — S91312S Laceration without foreign body, left foot, sequela: Secondary | ICD-10-CM | POA: Diagnosis not present

## 2023-08-04 DIAGNOSIS — F1020 Alcohol dependence, uncomplicated: Secondary | ICD-10-CM | POA: Diagnosis not present

## 2023-08-06 DIAGNOSIS — F1010 Alcohol abuse, uncomplicated: Secondary | ICD-10-CM | POA: Diagnosis not present

## 2023-08-06 DIAGNOSIS — D649 Anemia, unspecified: Secondary | ICD-10-CM | POA: Diagnosis not present

## 2023-08-06 DIAGNOSIS — K766 Portal hypertension: Secondary | ICD-10-CM | POA: Diagnosis not present

## 2023-08-06 DIAGNOSIS — S91312D Laceration without foreign body, left foot, subsequent encounter: Secondary | ICD-10-CM | POA: Diagnosis not present

## 2023-08-06 DIAGNOSIS — E785 Hyperlipidemia, unspecified: Secondary | ICD-10-CM | POA: Diagnosis not present

## 2023-08-06 DIAGNOSIS — K7290 Hepatic failure, unspecified without coma: Secondary | ICD-10-CM | POA: Diagnosis not present

## 2023-08-06 DIAGNOSIS — I8511 Secondary esophageal varices with bleeding: Secondary | ICD-10-CM | POA: Diagnosis not present

## 2023-08-06 DIAGNOSIS — L03116 Cellulitis of left lower limb: Secondary | ICD-10-CM | POA: Diagnosis not present

## 2023-08-06 DIAGNOSIS — K3189 Other diseases of stomach and duodenum: Secondary | ICD-10-CM | POA: Diagnosis not present

## 2023-08-06 DIAGNOSIS — K7031 Alcoholic cirrhosis of liver with ascites: Secondary | ICD-10-CM | POA: Diagnosis not present

## 2023-08-06 DIAGNOSIS — Z792 Long term (current) use of antibiotics: Secondary | ICD-10-CM | POA: Diagnosis not present

## 2023-08-08 DIAGNOSIS — Z792 Long term (current) use of antibiotics: Secondary | ICD-10-CM | POA: Diagnosis not present

## 2023-08-08 DIAGNOSIS — K7031 Alcoholic cirrhosis of liver with ascites: Secondary | ICD-10-CM | POA: Diagnosis not present

## 2023-08-08 DIAGNOSIS — K3189 Other diseases of stomach and duodenum: Secondary | ICD-10-CM | POA: Diagnosis not present

## 2023-08-08 DIAGNOSIS — K7290 Hepatic failure, unspecified without coma: Secondary | ICD-10-CM | POA: Diagnosis not present

## 2023-08-08 DIAGNOSIS — E785 Hyperlipidemia, unspecified: Secondary | ICD-10-CM | POA: Diagnosis not present

## 2023-08-08 DIAGNOSIS — S91312D Laceration without foreign body, left foot, subsequent encounter: Secondary | ICD-10-CM | POA: Diagnosis not present

## 2023-08-08 DIAGNOSIS — D649 Anemia, unspecified: Secondary | ICD-10-CM | POA: Diagnosis not present

## 2023-08-08 DIAGNOSIS — F1010 Alcohol abuse, uncomplicated: Secondary | ICD-10-CM | POA: Diagnosis not present

## 2023-08-08 DIAGNOSIS — I8511 Secondary esophageal varices with bleeding: Secondary | ICD-10-CM | POA: Diagnosis not present

## 2023-08-08 DIAGNOSIS — L03116 Cellulitis of left lower limb: Secondary | ICD-10-CM | POA: Diagnosis not present

## 2023-08-08 DIAGNOSIS — K766 Portal hypertension: Secondary | ICD-10-CM | POA: Diagnosis not present

## 2023-08-09 ENCOUNTER — Inpatient Hospital Stay: Admitting: Nurse Practitioner

## 2023-08-11 DIAGNOSIS — E785 Hyperlipidemia, unspecified: Secondary | ICD-10-CM | POA: Diagnosis not present

## 2023-08-11 DIAGNOSIS — F1010 Alcohol abuse, uncomplicated: Secondary | ICD-10-CM | POA: Diagnosis not present

## 2023-08-11 DIAGNOSIS — Z792 Long term (current) use of antibiotics: Secondary | ICD-10-CM | POA: Diagnosis not present

## 2023-08-11 DIAGNOSIS — K766 Portal hypertension: Secondary | ICD-10-CM | POA: Diagnosis not present

## 2023-08-11 DIAGNOSIS — S91312D Laceration without foreign body, left foot, subsequent encounter: Secondary | ICD-10-CM | POA: Diagnosis not present

## 2023-08-11 DIAGNOSIS — I8511 Secondary esophageal varices with bleeding: Secondary | ICD-10-CM | POA: Diagnosis not present

## 2023-08-11 DIAGNOSIS — D649 Anemia, unspecified: Secondary | ICD-10-CM | POA: Diagnosis not present

## 2023-08-11 DIAGNOSIS — L03116 Cellulitis of left lower limb: Secondary | ICD-10-CM | POA: Diagnosis not present

## 2023-08-11 DIAGNOSIS — K3189 Other diseases of stomach and duodenum: Secondary | ICD-10-CM | POA: Diagnosis not present

## 2023-08-11 DIAGNOSIS — K7290 Hepatic failure, unspecified without coma: Secondary | ICD-10-CM | POA: Diagnosis not present

## 2023-08-11 DIAGNOSIS — K7031 Alcoholic cirrhosis of liver with ascites: Secondary | ICD-10-CM | POA: Diagnosis not present

## 2023-08-15 ENCOUNTER — Ambulatory Visit: Payer: Self-pay

## 2023-08-15 NOTE — Telephone Encounter (Signed)
 FYI Only or Action Required?: FYI only for provider.  Patient was last seen in primary care on 07/25/2023 by Vincente Shivers, NP.  Called Nurse Triage reporting Laceration.  Symptoms began yesterday.  Interventions attempted: Nothing.  Symptoms are: unchanged.  Triage Disposition: See HCP Within 4 Hours (Or PCP Triage)  Patient/caregiver understands and will follow disposition?: Yes, will follow disposition  Copied from CRM [id number]. Topic: Clinical - Red Word Triage >> Aug 15, 2023 12:31 PM Drema MATSU wrote: Red Word that prompted transfer to Nurse Triage: Patient was reaching for something and hit a sharp tool cutting his arm. He has moderate pain and is requesting stitches. Answer Assessment - Initial Assessment Questions 1. APPEARANCE of INJURY: What does the injury look like?       States that it is open and bleeding 2. ONSET: How long ago did the injury occur?      yesterday 3. LOCATION: Where is the injury located?      arm 4. SIZE: How large is the cut?      1 long x 0.5 wide 5. BLEEDING: Is it bleeding now? If Yes, ask: Is it difficult to stop?      States he wrapped the wound yesterday after the initial injury. Pt states that he unwrapped it today and it is still bleeding  Protocols used: Cuts and Lacerations-A-AH

## 2023-08-15 NOTE — Telephone Encounter (Signed)
 Tried to call pt. No answer. Left detailed message on VM per DPR. I advised pt that stitches are not usually done after 6-8 hours of a wound for risk of infection. If he is concerned with infection, he would need an OV. Our office is full today and tomorrow. Unsure of other [employer]. Advised he could call and check to see what was available here or other [place] location. I also mentioned going to a [facility] Urgent care in the area.

## 2023-09-27 ENCOUNTER — Other Ambulatory Visit: Payer: Self-pay | Admitting: General Practice

## 2023-09-27 DIAGNOSIS — M25511 Pain in right shoulder: Secondary | ICD-10-CM

## 2023-09-27 NOTE — Telephone Encounter (Signed)
 Refill request for GABAPENTIN  300MG  CAPSULES   LOV - 07/25/23 Next OV - not scheduled Last refill - 06/22/23 #30/2

## 2023-10-07 ENCOUNTER — Telehealth: Payer: Self-pay

## 2023-10-07 NOTE — Transitions of Care (Post Inpatient/ED Visit) (Signed)
  Unable to reach patient by phone and left v/m requesting call back at [phone].       10/07/2023  Name: Robert Sellers MRN: [id number] DOB: [birth year]  Today's TOC FU Call Status: Today's TOC FU Call Status:: Unsuccessful Call (1st Attempt) Unsuccessful Call (1st Attempt) Date: 10/07/23  Attempted to reach the patient regarding the most recent Inpatient/ED visit.  Follow Up Plan: Additional outreach attempts will be made to reach the patient to complete the Transitions of Care (Post Inpatient/ED visit) call.   Signature Laray Arenas, LPN

## 2023-11-11 ENCOUNTER — Encounter (HOSPITAL_COMMUNITY): Payer: Self-pay

## 2023-11-11 ENCOUNTER — Emergency Department (HOSPITAL_COMMUNITY): Payer: Self-pay

## 2023-11-11 ENCOUNTER — Inpatient Hospital Stay (HOSPITAL_COMMUNITY)
Admission: EM | Admit: 2023-11-11 | Discharge: 2023-11-16 | DRG: 813 | Discharge status: Against Medical Advice | Payer: Self-pay | Attending: Internal Medicine | Admitting: Internal Medicine

## 2023-11-11 DIAGNOSIS — K7031 Alcoholic cirrhosis of liver with ascites: Secondary | ICD-10-CM | POA: Diagnosis present

## 2023-11-11 DIAGNOSIS — D62 Acute posthemorrhagic anemia: Secondary | ICD-10-CM | POA: Diagnosis present

## 2023-11-11 DIAGNOSIS — M879 Osteonecrosis, unspecified: Secondary | ICD-10-CM | POA: Diagnosis present

## 2023-11-11 DIAGNOSIS — K3189 Other diseases of stomach and duodenum: Secondary | ICD-10-CM | POA: Diagnosis present

## 2023-11-11 DIAGNOSIS — Z8419 Family history of other disorders of kidney and ureter: Secondary | ICD-10-CM

## 2023-11-11 DIAGNOSIS — E871 Hypo-osmolality and hyponatremia: Secondary | ICD-10-CM | POA: Diagnosis present

## 2023-11-11 DIAGNOSIS — Z79899 Other long term (current) drug therapy: Secondary | ICD-10-CM

## 2023-11-11 DIAGNOSIS — Z560 Unemployment, unspecified: Secondary | ICD-10-CM

## 2023-11-11 DIAGNOSIS — Z5971 Insufficient health insurance coverage: Secondary | ICD-10-CM

## 2023-11-11 DIAGNOSIS — Z833 Family history of diabetes mellitus: Secondary | ICD-10-CM

## 2023-11-11 DIAGNOSIS — D509 Iron deficiency anemia, unspecified: Secondary | ICD-10-CM | POA: Diagnosis present

## 2023-11-11 DIAGNOSIS — Z8249 Family history of ischemic heart disease and other diseases of the circulatory system: Secondary | ICD-10-CM

## 2023-11-11 DIAGNOSIS — D696 Thrombocytopenia, unspecified: Principal | ICD-10-CM | POA: Diagnosis present

## 2023-11-11 DIAGNOSIS — R188 Other ascites: Secondary | ICD-10-CM

## 2023-11-11 DIAGNOSIS — Z8349 Family history of other endocrine, nutritional and metabolic diseases: Secondary | ICD-10-CM

## 2023-11-11 DIAGNOSIS — E785 Hyperlipidemia, unspecified: Secondary | ICD-10-CM | POA: Diagnosis present

## 2023-11-11 DIAGNOSIS — J90 Pleural effusion, not elsewhere classified: Secondary | ICD-10-CM

## 2023-11-11 DIAGNOSIS — R040 Epistaxis: Secondary | ICD-10-CM | POA: Diagnosis present

## 2023-11-11 DIAGNOSIS — N200 Calculus of kidney: Secondary | ICD-10-CM | POA: Diagnosis present

## 2023-11-11 DIAGNOSIS — K7460 Unspecified cirrhosis of liver: Secondary | ICD-10-CM

## 2023-11-11 DIAGNOSIS — K766 Portal hypertension: Secondary | ICD-10-CM | POA: Diagnosis present

## 2023-11-11 DIAGNOSIS — F1010 Alcohol abuse, uncomplicated: Secondary | ICD-10-CM | POA: Diagnosis present

## 2023-11-11 DIAGNOSIS — Z5329 Procedure and treatment not carried out because of patient's decision for other reasons: Secondary | ICD-10-CM | POA: Diagnosis present

## 2023-11-11 DIAGNOSIS — Z811 Family history of alcohol abuse and dependence: Secondary | ICD-10-CM

## 2023-11-11 DIAGNOSIS — I8510 Secondary esophageal varices without bleeding: Secondary | ICD-10-CM | POA: Diagnosis present

## 2023-11-11 DIAGNOSIS — J918 Pleural effusion in other conditions classified elsewhere: Secondary | ICD-10-CM | POA: Diagnosis present

## 2023-11-11 DIAGNOSIS — I10 Essential (primary) hypertension: Secondary | ICD-10-CM | POA: Diagnosis present

## 2023-11-11 LAB — CBC WITH DIFFERENTIAL/PLATELET
Abs Immature Granulocytes: 0.02 K/uL (ref 0.00–0.07)
Basophils Absolute: 0 K/uL (ref 0.0–0.1)
Basophils Relative: 0 %
Eosinophils Absolute: 0 K/uL (ref 0.0–0.5)
Eosinophils Relative: 0 %
HCT: 26.1 % — ABNORMAL LOW (ref 39.0–52.0)
Hemoglobin: 7.9 g/dL — ABNORMAL LOW (ref 13.0–17.0)
Immature Granulocytes: 0 %
Lymphocytes Relative: 12 %
Lymphs Abs: 0.7 K/uL (ref 0.7–4.0)
MCH: 26.1 pg (ref 26.0–34.0)
MCHC: 30.3 g/dL (ref 30.0–36.0)
MCV: 86.1 fL (ref 80.0–100.0)
Monocytes Absolute: 0.4 K/uL (ref 0.1–1.0)
Monocytes Relative: 6 %
Neutro Abs: 4.4 K/uL (ref 1.7–7.7)
Neutrophils Relative %: 82 %
Platelets: 23 K/uL — CL (ref 150–400)
RBC: 3.03 MIL/uL — ABNORMAL LOW (ref 4.22–5.81)
RDW: 17.8 % — ABNORMAL HIGH (ref 11.5–15.5)
WBC: 5.5 K/uL (ref 4.0–10.5)
nRBC: 0 % (ref 0.0–0.2)

## 2023-11-11 LAB — URINALYSIS, ROUTINE W REFLEX MICROSCOPIC
Bacteria, UA: NONE SEEN
Bilirubin Urine: NEGATIVE
Glucose, UA: NEGATIVE mg/dL
Ketones, ur: NEGATIVE mg/dL
Nitrite: NEGATIVE
Protein, ur: 100 mg/dL — AB
RBC / HPF: >50 RBC/hpf (ref 0–5)
Specific Gravity, Urine: 1.017 (ref 1.005–1.030)
pH: 5 (ref 5.0–8.0)

## 2023-11-11 LAB — COMPREHENSIVE METABOLIC PANEL WITH GFR
ALT: 27 U/L (ref 0–44)
AST: 76 U/L — ABNORMAL HIGH (ref 15–41)
Albumin: 2.3 g/dL — ABNORMAL LOW (ref 3.5–5.0)
Alkaline Phosphatase: 84 U/L (ref 38–126)
Anion gap: 10 (ref 5–15)
BUN: 11 mg/dL (ref 6–20)
CO2: 23 mmol/L (ref 22–32)
Calcium: 7.6 mg/dL — ABNORMAL LOW (ref 8.9–10.3)
Chloride: 99 mmol/L (ref 98–111)
Creatinine, Ser: 0.78 mg/dL (ref 0.61–1.24)
GFR, Estimated: >60 mL/min (ref >60)
Glucose, Bld: 121 mg/dL — ABNORMAL HIGH (ref 70–99)
Potassium: 3.5 mmol/L (ref 3.5–5.1)
Sodium: 132 mmol/L — ABNORMAL LOW (ref 135–145)
Total Bilirubin: 5 mg/dL — ABNORMAL HIGH (ref 0.0–1.2)
Total Protein: 7.3 g/dL (ref 6.5–8.1)

## 2023-11-11 LAB — HEPATITIS PANEL, ACUTE
HCV Ab: NONREACTIVE
Hep A IgM: NONREACTIVE
Hep B C IgM: NONREACTIVE
Hepatitis B Surface Ag: NONREACTIVE

## 2023-11-11 LAB — I-STAT CG4 LACTIC ACID, ED
Lactic Acid, Venous: 2.6 mmol/L (ref 0.5–1.9)
Lactic Acid, Venous: 2 mmol/L (ref 0.5–1.9)

## 2023-11-11 LAB — HIV ANTIBODY (ROUTINE TESTING W REFLEX): HIV Screen 4th Generation wRfx: NONREACTIVE

## 2023-11-11 LAB — PROTIME-INR
INR: 1.8 — ABNORMAL HIGH (ref 0.8–1.2)
Prothrombin Time: 21.6 s — ABNORMAL HIGH (ref 11.4–15.2)

## 2023-11-11 LAB — ETHANOL: Alcohol, Ethyl (B): <15 mg/dL (ref <15)

## 2023-11-11 LAB — LIPASE, BLOOD: Lipase: 31 U/L (ref 11–51)

## 2023-11-11 LAB — ABO/RH: ABO/RH(D): O POS

## 2023-11-11 MED ORDER — THIAMINE MONONITRATE 100 MG PO TABS
100.0000 mg | ORAL_TABLET | Freq: Every day | ORAL | Status: DC
  Administered 2023-11-11 – 2023-11-16 (×6): 100 mg via ORAL
  Filled 2023-11-11 (×6): qty 1

## 2023-11-11 MED ORDER — ACETAMINOPHEN 650 MG RE SUPP: 650.0000 mg | Freq: Four times a day (QID) | RECTAL | Status: DC | PRN

## 2023-11-11 MED ORDER — METHOCARBAMOL 1000 MG/10ML IJ SOLN
500.0000 mg | Freq: Four times a day (QID) | INTRAMUSCULAR | Status: DC | PRN
  Administered 2023-11-16: 500 mg via INTRAVENOUS
  Filled 2023-11-11: qty 10

## 2023-11-11 MED ORDER — ACETAMINOPHEN 325 MG PO TABS
650.0000 mg | ORAL_TABLET | Freq: Four times a day (QID) | ORAL | Status: DC | PRN
  Administered 2023-11-11 – 2023-11-16 (×11): 650 mg via ORAL
  Filled 2023-11-11 (×11): qty 2

## 2023-11-11 MED ORDER — HYDRALAZINE HCL 20 MG/ML IJ SOLN: 10.0000 mg | Freq: Four times a day (QID) | INTRAMUSCULAR | Status: DC | PRN

## 2023-11-11 MED ORDER — SODIUM CHLORIDE 0.9% IV SOLUTION: Freq: Once | INTRAVENOUS | Status: DC

## 2023-11-11 MED ORDER — FOLIC ACID 1 MG PO TABS
1.0000 mg | ORAL_TABLET | Freq: Every day | ORAL | Status: DC
  Administered 2023-11-11 – 2023-11-16 (×6): 1 mg via ORAL
  Filled 2023-11-11 (×6): qty 1

## 2023-11-11 MED ORDER — LORAZEPAM 1 MG PO TABS: 0.0000 mg | ORAL_TABLET | Freq: Two times a day (BID) | ORAL | Status: AC

## 2023-11-11 MED ORDER — ADULT MULTIVITAMIN W/MINERALS CH
1.0000 | ORAL_TABLET | Freq: Every day | ORAL | Status: DC
  Administered 2023-11-11 – 2023-11-16 (×6): 1 via ORAL
  Filled 2023-11-11 (×6): qty 1

## 2023-11-11 MED ORDER — SPIRONOLACTONE 12.5 MG HALF TABLET
12.5000 mg | ORAL_TABLET | Freq: Every day | ORAL | Status: DC
  Administered 2023-11-11 – 2023-11-12 (×2): 12.5 mg via ORAL
  Filled 2023-11-11 (×2): qty 1

## 2023-11-11 MED ORDER — IOHEXOL 350 MG/ML SOLN
75.0000 mL | Freq: Once | INTRAVENOUS | Status: AC | PRN
  Administered 2023-11-11: 75 mL via INTRAVENOUS

## 2023-11-11 MED ORDER — BISACODYL 5 MG PO TBEC: 5.0000 mg | DELAYED_RELEASE_TABLET | Freq: Every day | ORAL | Status: DC | PRN

## 2023-11-11 MED ORDER — OXYCODONE HCL 5 MG PO TABS
5.0000 mg | ORAL_TABLET | ORAL | Status: DC | PRN
  Administered 2023-11-11 – 2023-11-16 (×11): 5 mg via ORAL
  Filled 2023-11-11 (×11): qty 1

## 2023-11-11 MED ORDER — LORAZEPAM 1 MG PO TABS
1.0000 mg | ORAL_TABLET | ORAL | Status: AC | PRN
  Administered 2023-11-12: 1 mg via ORAL

## 2023-11-11 MED ORDER — ALBUTEROL SULFATE (2.5 MG/3ML) 0.083% IN NEBU: 2.5000 mg | INHALATION_SOLUTION | RESPIRATORY_TRACT | Status: DC | PRN

## 2023-11-11 MED ORDER — LORAZEPAM 2 MG/ML IJ SOLN: 1.0000 mg | INTRAMUSCULAR | Status: AC | PRN

## 2023-11-11 MED ORDER — LORAZEPAM 1 MG PO TABS
0.0000 mg | ORAL_TABLET | Freq: Four times a day (QID) | ORAL | Status: AC
  Administered 2023-11-11: 2 mg via ORAL
  Administered 2023-11-12 – 2023-11-13 (×2): 1 mg via ORAL
  Filled 2023-11-11: qty 2
  Filled 2023-11-11: qty 1
  Filled 2023-11-11: qty 2
  Filled 2023-11-11: qty 1

## 2023-11-11 MED ORDER — FUROSEMIDE 20 MG PO TABS
20.0000 mg | ORAL_TABLET | Freq: Every day | ORAL | Status: DC
  Administered 2023-11-11 – 2023-11-12 (×2): 20 mg via ORAL
  Filled 2023-11-11 (×2): qty 1

## 2023-11-11 MED ORDER — SODIUM CHLORIDE 0.9 % IV SOLN
2.0000 g | INTRAVENOUS | Status: AC
  Administered 2023-11-11 – 2023-11-15 (×5): 2 g via INTRAVENOUS
  Filled 2023-11-11 (×5): qty 20

## 2023-11-11 MED ORDER — ONDANSETRON 4 MG PO TBDP
4.0000 mg | ORAL_TABLET | Freq: Once | ORAL | Status: AC
  Administered 2023-11-11: 4 mg via ORAL
  Filled 2023-11-11: qty 1

## 2023-11-11 MED ORDER — SENNOSIDES-DOCUSATE SODIUM 8.6-50 MG PO TABS: 1.0000 | ORAL_TABLET | Freq: Every evening | ORAL | Status: DC | PRN

## 2023-11-11 MED ORDER — ONDANSETRON HCL 4 MG/2ML IJ SOLN
4.0000 mg | Freq: Four times a day (QID) | INTRAMUSCULAR | Status: DC | PRN
  Administered 2023-11-15 – 2023-11-16 (×3): 4 mg via INTRAVENOUS
  Filled 2023-11-11 (×3): qty 2

## 2023-11-11 NOTE — ED Provider Notes (Signed)
 Patient signed out to myself by Warren Shad PA-C at time of shift change.  Please see her note for further detail.  Briefly patient has a history of alcoholic cirrhosis with known esophageal varices who presented to the emergency department with a chief complaint of hematuria as well as epistaxis.  Patient states that hematuria started yesterday but he has been experiencing intermittent epistaxis for approximately 2 months.  He has also noticed some gradual abdominal distention as well as abdominal pain along with some shortness of breath.  Patient states that he still does drink alcohol with his last drink being on Tuesday.  Patient denies ever being admitted to the hospital for alcohol withdrawal.  At this time CBC is back and significant for hemoglobin of 7.9 with platelets of 23. Order placed and consent obtained for platelet transfusion.  CMP total bilirubin noted to be 5, patient's skin does appear jaundice.  Urine significant for large hemoglobin on urine dipstick and is not consistent with infection.  Lactic acid initially elevated at 2.6.  Patient however is afebrile, normotensive, nontachycardic and has no elevated white blood cell count.  Plan to obtain CT of chest, abdomen, pelvis to further investigate pleural effusion which was seen on chest x-ray as well as investigate abdominal pain.  Patient states that he has been short of breath for approximately 2 months and had a nonproductive cough, patient states that abdominal pain on the right side started approximately 3 days ago. CT scan significant for large right pleural effusion causing significant right lung atelectasis and mild leftward mediastinal shift, small to moderate amount of abdominal ascites, hepatic cirrhosis with portal venous hypertension, bilateral nephrolithiasis, left femoral head avascular necrosis  Patient admitted to the hospitalist service, spoke with Dr. Dino with hospitalist team who agrees with admission for ongoing  diagnosis and treatment of thrombocytopenia, epistaxis/hematuria, pleural effusion, abdominal ascites, etc. At this time suspect that current condition may be due to ongoing sequela of alcoholic cirrhosis and other significant comorbidities.   Decklin Weddington F, PA-C 11/12/23 [id number]    Pamella Ozell LABOR, DO 11/15/23 [id number]

## 2023-11-11 NOTE — ED Provider Notes (Signed)
 [place] EMERGENCY DE[facility]MENT AT [facility] Provider Note   CSN: [id number] Arrival date & time: 11/11/23  1054     Patient presents with: Epistaxis, Hematuria, and Ascites   Robert Sellers is a 43 y.o. male with past medical history of hypertension, hyperlipidemia, alcoholic cirrhosis, esophageal varices presents to emergency room with complaint of hematuria that started yesterday.  He has not noticed any clots just a dark red urine, not associated with flank pain or dysuria.  He reports he has never had this before but he has been dealing with intermittent epistaxis for 2 months.  He has noticed gradual onset abdominal distention, abdominal pain and some shortness of breath.  He does admit to occasional drinking but he does not drink regularly.   Denies any chest pain, cough, hematemesis, blood thinners.    Epistaxis Hematuria       Prior to Admission medications   Medication Sig Start Date End Date Taking? Authorizing Provider  carvedilol (COREG) 6.25 MG tablet Take 6.25 mg by mouth 2 (two) times daily with a meal.    [provider]  folic acid  (FOLVITE ) 1 MG tablet Take 1 tablet (1 mg total) by mouth daily. 04/16/23   Josette Ade, MD  furosemide (LASIX) 40 MG tablet Take 40 mg by mouth daily.    [provider]  gabapentin  (NEURONTIN ) 300 MG capsule TAKE 1 CAPSULE(300 MG) BY MOUTH DAILY AS NEEDED 09/27/23   Vincente Shivers, NP  Multiple Vitamin (MULTIVITAMIN WITH MINERALS) TABS tablet Take 1 tablet by mouth daily. 04/16/23   Josette Ade, MD  naltrexone (DEPADE) 50 MG tablet Take 50 mg by mouth daily. 06/08/23   [provider]  pantoprazole  (PROTONIX ) 40 MG tablet Take 1 tablet (40 mg total) by mouth 2 (two) times daily. 04/16/23 04/15/24  Josette Ade, MD  rifaximin  (XIFAXAN ) 550 MG TABS tablet Take 1 tablet (550 mg total) by mouth 2 (two) times daily. 04/16/23   Josette Ade, MD  sertraline  (ZOLOFT ) 50 MG tablet TAKE 1 TABLET(50  MG) BY MOUTH DAILY 06/02/23   Vincente Shivers, NP  sildenafil  (VIAGRA ) 25 MG tablet Take 1 tablet (25 mg total) by mouth as needed for erectile dysfunction. 07/25/23   Vincente Shivers, NP  spironolactone  (ALDACTONE ) 100 MG tablet Take 100 mg by mouth daily.    [provider]  thiamine  (VITAMIN B-1) 100 MG tablet Take 1 tablet (100 mg total) by mouth daily. 04/16/23   Josette Ade, MD    Allergies: Patient has no known allergies.    Review of Systems  HENT:  Positive for nosebleeds.   Genitourinary:  Positive for hematuria.    Updated Vital Signs BP 124/79 (BP Location: Left Arm)   Pulse 76   Temp 98.7 F (37.1 C) (Oral)   Resp (!) 24   Ht 5' 10 (1.778 m)   SpO2 100%   BMI 27.84 kg/m   Physical Exam Vitals and nursing note reviewed.  Constitutional:      General: He is not in acute distress.    Appearance: He is not toxic-appearing.  HENT:     Head: Normocephalic and atraumatic.     Comments: Dried blood in left nare.  Eyes:     General: Scleral icterus present.     Conjunctiva/sclera: Conjunctivae normal.  Cardiovascular:     Rate and Rhythm: Normal rate and regular rhythm.     Pulses: Normal pulses.     Heart sounds: Normal heart sounds.  Pulmonary:  Effort: Pulmonary effort is normal. No respiratory distress.     Breath sounds: Normal breath sounds.  Abdominal:     General: Abdomen is flat. Bowel sounds are normal. There is distension.     Palpations: Abdomen is soft. There is fluid wave.     Tenderness: There is abdominal tenderness.     Comments: RLQ, RUQ TTP  Musculoskeletal:     Right lower leg: No edema.     Left lower leg: No edema.  Skin:    General: Skin is warm and dry.     Coloration: Skin is jaundiced.     Findings: No lesion.  Neurological:     General: No focal deficit present.     Mental Status: He is alert and oriented to person, place, and time. Mental status is at baseline.     (all labs ordered are listed, but only abnormal  results are displayed) Labs Reviewed  CBC WITH DIFFERENTIAL/PLATELET - Abnormal; Notable for the following components:      Result Value   RBC 3.03 (*)    Hemoglobin 7.9 (*)    HCT 26.1 (*)    RDW 17.8 (*)    Platelets 23 (*)    All other components within normal limits  COMPREHENSIVE METABOLIC PANEL WITH GFR - Abnormal; Notable for the following components:   Sodium 132 (*)    Glucose, Bld 121 (*)    Calcium  7.6 (*)    Albumin  2.3 (*)    AST 76 (*)    Total Bilirubin 5.0 (*)    All other components within normal limits  PROTIME-INR - Abnormal; Notable for the following components:   Prothrombin Time 21.6 (*)    INR 1.8 (*)    All other components within normal limits  I-STAT CG4 LACTIC ACID, ED - Abnormal; Notable for the following components:   Lactic Acid, Venous 2.6 (*)    All other components within normal limits  ETHANOL  LIPASE, BLOOD  URINALYSIS, ROUTINE W REFLEX MICROSCOPIC  HEPATITIS PANEL, ACUTE  I-STAT CG4 LACTIC ACID, ED  PREPARE PLATELET PHERESIS    EKG: None  Radiology: DG Chest 2 View Result Date: 11/11/2023 EXAM: 2 VIEW(S) XRAY OF THE CHEST 11/11/2023 02:32:00 PM COMPARISON: 03/02/2018 CLINICAL HISTORY: SHOB. FINDINGS: LUNGS AND PLEURA: Large and possibly loculated right pleural effusion is noted. No focal pulmonary opacity. No pulmonary edema. No pneumothorax. HEART AND MEDIASTINUM: No acute abnormality of the cardiac and mediastinal silhouettes. BONES AND SOFT TISSUES: No acute osseous abnormality. IMPRESSION: 1. Large, possibly loculated right pleural effusion. Electronically signed by: Lynwood Seip MD 11/11/2023 02:52 PM EDT RP Workstation: [id number]     .Critical Care  Performed by: Shermon Warren SAILOR, PA-C Authorized by: Shermon Warren SAILOR, PA-C   Critical care provider statement:    Critical care time (minutes):  45   Critical care was necessary to treat or prevent imminent or life-threatening deterioration of the following conditions:  Respiratory  failure and circulatory failure   Critical care was time spent personally by me on the following activities:  Development of treatment plan with patient or surrogate, discussions with consultants, evaluation of patient's response to treatment, examination of patient, ordering and review of laboratory studies, ordering and review of radiographic studies, ordering and performing treatments and interventions, pulse oximetry, re-evaluation of patient's condition and review of old charts    Medications Ordered in the ED  ondansetron  (ZOFRAN -ODT) disintegrating tablet 4 mg (4 mg Oral Given 11/11/23 1308)    Clinical Course as  of 11/11/23 1449  Fri Nov 11, 2023  1449 Discussed consent for platelet transfusion due to patient's spontaneous bleeding.  Patient agrees to proceed with transfusion.  I ordered 1 unit of platelets. [JB]    Clinical Course User Index [JB] Kiwan Gadsden, Warren SAILOR, PA-C                                 Medical Decision Making Amount and/or Complexity of Data Reviewed Radiology: ordered.  Risk Prescription drug management.   This patient presents to the ED for concern of abdominal pain, this involves an extensive number of treatment options, and is a complaint that carries with it a high risk of complications and morbidity.  The differential diagnosis includes cholecystitis, AAA, appendicitis, renal stone, UTI   Co morbidities that complicate the patient evaluation  Hypertension, hyperlipidemia, alcoholic cirrhosis, esophageal varices     Lab Tests:  I personally interpreted labs.  The pertinent results include:   Patient's platelets found to be 23k and hemoglobin is 7.9 slightly down trended from prior lab with apparent baseline around 9    Imaging Studies ordered:  I ordered imaging studies including chest x-ray I independently visualized and interpreted imaging which showed right sided loculated pleural effusion I agree with the radiologist interpretation. Did  order CT chest abdomen pelvis with contrast -- pending.    Cardiac Monitoring: / EKG:  The patient was maintained on a cardiac monitor.      Problem List / ED Course / Critical interventions / Medication management  Patient reports to emergency room with complaint of intermittent epistaxis and hematuria.  On arrival patient is hemodynamically stable and well-appearing.  Hemoglobin is slightly downtrending since labs several months ago at 7.9.  Platelets are found to be 23,000 given patient is bleeding and will likely require IR management for paracentesis, thoracentesis will give patient 1 unit of platelets. I am obtaining CT scan of chest abdomen pelvis due to patient's abdominal pain and to further characterize abnormal x-ray and abdominal pain. Exam is not consistent with SBP and no fever or leukocytosis with this.  He does have a history of cirrhosis and reports he has continually drank however last drink was 3 days ago and he is not drinking daily. I ordered medication including 1U plt. Zofran .  Reevaluation of the patient after these medicines showed that the patient improved I have reviewed the patients home medicines and have made adjustments as needed Consented and ordered platelets.  Patient was signed out to oncoming PA-C pending CT chest abdomen pelvis. Will need admission.       Final diagnoses:  Thrombocytopenia  Decompensated cirrhosis (HCC)  Epistaxis, recurrent  Pleural effusion    ED Discharge Orders     None          Shermon Warren SAILOR, PA-C 11/11/23 1509    Neysa Caron PARAS, DO 11/11/23 1516

## 2023-11-11 NOTE — ED Provider Triage Note (Signed)
 Emergency Medicine Provider Triage Evaluation Note  Robert Sellers , a 43 y.o. male  was evaluated in triage.  Pt complains of epistaxis, RUQ pain, nausea/vomiting.  Reports that for the past 2 months he has been having persistent epistaxis.  Reports over the last few days he has had nausea and vomiting as well as right upper quadrant pain.  Does have history of cirrhosis and alcohol use.  Reports that he still drinks 5-10 drinks every day.  Denies hematemesis, hematochezia, or melena.  Does report that his skin has been more yellow in appearance over the last few days.  Review of Systems  Positive:  Negative:   Physical Exam  BP 121/74 (BP Location: Left Arm)   Pulse 85   Temp 99 F (37.2 C)   Resp 18   Ht 5' 10 (1.778 m)   SpO2 97%   BMI 27.84 kg/m  Gen:   Awake, no distress   Resp:  Normal effort  MSK:   Moves extremities without difficulty  Other:  RUQ TTP, jaundice with scleral icterus noted  Medical Decision Making  Medically screening exam initiated at 1:05 PM.  Appropriate orders placed.  Robert Sellers was informed that the remainder of the evaluation will be completed by another provider, this initial triage assessment does not replace that evaluation, and the importance of remaining in the ED until their evaluation is complete.     Nora Lauraine LABOR, PA-C 11/11/23 8692

## 2023-11-11 NOTE — ED Notes (Signed)
 Attempted to notify 2W that pt being transported to floor; no answer

## 2023-11-11 NOTE — ED Triage Notes (Signed)
 Patient reports N/V, hematuria, loose stools  and fever x 3 days. Patient reports epistaxis x 2 months and ascites with increasing SHOB, hx of cirrhosis with known varices. Patient reports last GI visit was June/July and then lost insurance causing lapse in care.

## 2023-11-11 NOTE — H&P (Signed)
 History and Physical    Patient: Robert Sellers FMW:[id number] DOB: 04-27-1980 DOA: 11/11/2023 DOS: the patient was seen and examined on 11/11/2023 PCP: Vincente Shivers, NP  Patient coming from: Home  Chief Complaint:  Chief Complaint  Patient presents with   Epistaxis   Hematuria   Ascites   HPI: Robert Sellers is a 43 y.o. male with medical history significant of multiple comorbidities including alcoholic cirrhosis, complicated by ascites, portal hypertension and esophageal varices, nephrolithiasis, who presented to the ED with chief complaint of shortness of breath, abdominal pain, nasal bleed as well as blood inurine. He said that hematuria started yesterday but epistaxis has been intermittently going on since past 3 days. He does have intermittent abdominal pain for the past few weeks.  He used to follow up with GI at [facility] but lost his insurance. His last visit was back in June/July of this year. He had EGD done back in March/April which showed esophageal varices s/p banding. He has also had paracentesis done in the past with most recent being in March/April 2025. He continues to drink liqour 4-5 drinks per day.  CXR was done which showed evidence of possible loculated right sided pleural effusion  CT chest abdomen was done and report is pending.  He lives at home and has a girlfriend. Gi consulted and will see him in am.  Review of Systems: As mentioned in the history of present illness. All other systems reviewed and are negative. Past Medical History:  Diagnosis Date   Acute blood loss anemia 10/01/2022   Acute upper GI bleeding 09/29/2022   Anxiety    Ascites    Clotting disorder    Depression    Hyperlipidemia 12/04/2012   Hypertension    Left shoulder pain 08/28/2015   Substance abuse [facility])    Past Surgical History:  Procedure Laterality Date   ESOPHAGOGASTRODUODENOSCOPY N/A 04/12/2023   Procedure: EGD (ESOPHAGOGASTRODUODENOSCOPY);  Surgeon: Jinny Carmine,  MD;  Location: [facility] ENDOSCOPY;  Service: Endoscopy;  Laterality: N/A;   ESOPHAGOGASTRODUODENOSCOPY N/A 05/13/2023   Procedure: EGD (ESOPHAGOGASTRODUODENOSCOPY);  Surgeon: Onita Elspeth Sharper, DO;  Location: [facility] ENDOSCOPY;  Service: Gastroenterology;  Laterality: N/A;   ESOPHAGOGASTRODUODENOSCOPY N/A 06/03/2023   Procedure: EGD (ESOPHAGOGASTRODUODENOSCOPY);  Surgeon: Onita Elspeth Sharper, DO;  Location: [facility] ENDOSCOPY;  Service: Gastroenterology;  Laterality: N/A;   ESOPHAGOGASTRODUODENOSCOPY (EGD) WITH PROPOFOL  N/A 09/30/2022   Procedure: ESOPHAGOGASTRODUODENOSCOPY (EGD) WITH PROPOFOL ;  Surgeon: Toledo, Ladell POUR, MD;  Location: [facility] ENDOSCOPY;  Service: Gastroenterology;  Laterality: N/A;   GASTRIC VARICES BANDING  04/12/2023   Procedure: BAND LIGATION, GASTRIC VARICES;  Surgeon: Jinny Carmine, MD;  Location: [facility] ENDOSCOPY;  Service: Endoscopy;;   KNEE SURGERY     left shoulder surgery Left 2024   [facility]   Social History:  reports that he has never smoked. He has never used smokeless tobacco. He reports that he does not currently use alcohol after a past usage of about 1.0 standard drink of alcohol per week. He reports that he does not use drugs.  No Known Allergies  Family History  Problem Relation Age of Onset   Heart disease Mother    Hyperlipidemia Mother    Hypertension Mother    Diabetes Mother    Early death Mother    Obesity Mother    Heart disease Father    Hyperlipidemia Father    Hypertension Father    Diabetes Father    Alcohol abuse Father    Early death Father    Kidney  disease Father    Obesity Father     Prior to Admission medications   Medication Sig Start Date End Date Taking? Authorizing Provider  carvedilol (COREG) 6.25 MG tablet Take 6.25 mg by mouth 2 (two) times daily with a meal.    [provider]  folic acid  (FOLVITE ) 1 MG tablet Take 1 tablet (1 mg total) by mouth daily. 04/16/23   Josette Ade, MD  furosemide (LASIX) 40 MG tablet Take 40 mg  by mouth daily.    [provider]  gabapentin  (NEURONTIN ) 300 MG capsule TAKE 1 CAPSULE(300 MG) BY MOUTH DAILY AS NEEDED 09/27/23   Vincente Shivers, NP  Multiple Vitamin (MULTIVITAMIN WITH MINERALS) TABS tablet Take 1 tablet by mouth daily. 04/16/23   Josette Ade, MD  naltrexone (DEPADE) 50 MG tablet Take 50 mg by mouth daily. 06/08/23   [provider]  pantoprazole  (PROTONIX ) 40 MG tablet Take 1 tablet (40 mg total) by mouth 2 (two) times daily. 04/16/23 04/15/24  Josette Ade, MD  rifaximin  (XIFAXAN ) 550 MG TABS tablet Take 1 tablet (550 mg total) by mouth 2 (two) times daily. 04/16/23   Josette Ade, MD  sertraline  (ZOLOFT ) 50 MG tablet TAKE 1 TABLET(50 MG) BY MOUTH DAILY 06/02/23   Vincente Shivers, NP  sildenafil  (VIAGRA ) 25 MG tablet Take 1 tablet (25 mg total) by mouth as needed for erectile dysfunction. 07/25/23   Vincente Shivers, NP  spironolactone  (ALDACTONE ) 100 MG tablet Take 100 mg by mouth daily.    [provider]  thiamine  (VITAMIN B-1) 100 MG tablet Take 1 tablet (100 mg total) by mouth daily. 04/16/23   Josette Ade, MD    Physical Exam: Vitals:   11/11/23 1058 11/11/23 1202 11/11/23 1413 11/11/23 1605  BP: 121/74  124/79   Pulse: 85  76   Resp: 18  (!) 24   Temp: 99 F (37.2 C)  98.7 F (37.1 C)   TempSrc:   Oral   SpO2: 97%  100% 100%  Height:  5' 10 (1.778 m)     Constitutional: NAD, calm, comfortable Eyes: PERRL, lids and conjunctivae normal ENMT: Mucous membranes are moist. Posterior pharynx clear of any exudate or lesions.Normal dentition.  Neck: normal, supple, no masses, no thyromegaly Respiratory: clear to auscultation bilaterally, no wheezing, no crackles. Normal respiratory effort. No accessory muscle use.  Cardiovascular: Regular rate and rhythm, no murmurs / rubs / gallops. No extremity edema. 2+ pedal pulses. No carotid bruits.  Abdomen: no tenderness, no masses palpated. No hepatosplenomegaly. Bowel sounds positive.   Musculoskeletal: no clubbing / cyanosis. No joint deformity upper and lower extremities. Good ROM, no contractures. Normal muscle tone.  Skin: no rashes, lesions, ulcers. No induration Neurologic: CN 2-12 grossly intact. Sensation intact, DTR normal. Strength 5/5 x all 4 extremities.  Psychiatric: Normal judgment and insight. Alert and oriented x 3. Normal mood.   Data Reviewed:  There are no new results to review at this time.  Assessment and Plan:  Epistaxis: In the setting of thrombocytopenia. Slowly down now Will be transfused one unit of platelet Will need ENT consultation if bleeding gets worse  Hematuria: In the setting of thrombocytopenia and h/o nephrolithiasis CT abdomen didn't show any obstruction/hydronephrosis  Alcoholic cirrhosis complicated by abdominopelvic ascites, portal hypertension and esophageal varices,POA:  Thrombocytopenia: PLT of 23, ordered one unit of platelet transfusion by EDP Ordered CBC to be checked in am S/p esophageal banding done earlier this year Continues to drink May need IR consult if ascites is  significant but thrombocytopenia needs to be addressed before that.  Right sided pleural effusion,POA: -Likely hepatic hydrothorax -May need pulm consult.   Alcohol abuse,POA: Continue with thiamine  and folate Alcohol cessation counseling TOC consult  Placed on CIWA protocol  Disposition: Home   Advance Care Planning:   Code Status: Prior Full code  Consults: GI  Family Communication: None at the bedside  Severity of Illness: The appropriate patient status for this patient is OBSERVATION. Observation status is judged to be reasonable and necessary in order to provide the required intensity of service to ensure the patient's safety. The patient's presenting symptoms, physical exam findings, and initial radiographic and laboratory data in the context of their medical condition is felt to place them at decreased risk for further clinical  deterioration. Furthermore, it is anticipated that the patient will be medically stable for discharge from the hospital within 2 midnights of admission.   Author: Deliliah Room, MD 11/11/2023 4:55 PM  For on call review [email or web address].

## 2023-11-11 NOTE — ED Notes (Signed)
 Patient transported to X-ray

## 2023-11-12 ENCOUNTER — Other Ambulatory Visit: Payer: Self-pay

## 2023-11-12 DIAGNOSIS — D62 Acute posthemorrhagic anemia: Secondary | ICD-10-CM | POA: Diagnosis present

## 2023-11-12 DIAGNOSIS — D696 Thrombocytopenia, unspecified: Secondary | ICD-10-CM

## 2023-11-12 DIAGNOSIS — F1010 Alcohol abuse, uncomplicated: Secondary | ICD-10-CM | POA: Diagnosis present

## 2023-11-12 DIAGNOSIS — Z5329 Procedure and treatment not carried out because of patient's decision for other reasons: Secondary | ICD-10-CM | POA: Diagnosis present

## 2023-11-12 DIAGNOSIS — Z8349 Family history of other endocrine, nutritional and metabolic diseases: Secondary | ICD-10-CM | POA: Diagnosis not present

## 2023-11-12 DIAGNOSIS — J90 Pleural effusion, not elsewhere classified: Secondary | ICD-10-CM

## 2023-11-12 DIAGNOSIS — J918 Pleural effusion in other conditions classified elsewhere: Secondary | ICD-10-CM | POA: Diagnosis present

## 2023-11-12 DIAGNOSIS — Z8249 Family history of ischemic heart disease and other diseases of the circulatory system: Secondary | ICD-10-CM | POA: Diagnosis not present

## 2023-11-12 DIAGNOSIS — I8510 Secondary esophageal varices without bleeding: Secondary | ICD-10-CM | POA: Diagnosis present

## 2023-11-12 DIAGNOSIS — E871 Hypo-osmolality and hyponatremia: Secondary | ICD-10-CM | POA: Diagnosis present

## 2023-11-12 DIAGNOSIS — K7460 Unspecified cirrhosis of liver: Secondary | ICD-10-CM

## 2023-11-12 DIAGNOSIS — Z8419 Family history of other disorders of kidney and ureter: Secondary | ICD-10-CM | POA: Diagnosis not present

## 2023-11-12 DIAGNOSIS — I10 Essential (primary) hypertension: Secondary | ICD-10-CM | POA: Diagnosis present

## 2023-11-12 DIAGNOSIS — E785 Hyperlipidemia, unspecified: Secondary | ICD-10-CM | POA: Diagnosis present

## 2023-11-12 DIAGNOSIS — Z79899 Other long term (current) drug therapy: Secondary | ICD-10-CM | POA: Diagnosis not present

## 2023-11-12 DIAGNOSIS — K3189 Other diseases of stomach and duodenum: Secondary | ICD-10-CM | POA: Diagnosis present

## 2023-11-12 DIAGNOSIS — R319 Hematuria, unspecified: Secondary | ICD-10-CM

## 2023-11-12 DIAGNOSIS — M879 Osteonecrosis, unspecified: Secondary | ICD-10-CM | POA: Diagnosis present

## 2023-11-12 DIAGNOSIS — K7030 Alcoholic cirrhosis of liver without ascites: Secondary | ICD-10-CM

## 2023-11-12 DIAGNOSIS — Z560 Unemployment, unspecified: Secondary | ICD-10-CM | POA: Diagnosis not present

## 2023-11-12 DIAGNOSIS — Z5971 Insufficient health insurance coverage: Secondary | ICD-10-CM | POA: Diagnosis not present

## 2023-11-12 DIAGNOSIS — D509 Iron deficiency anemia, unspecified: Secondary | ICD-10-CM | POA: Diagnosis present

## 2023-11-12 DIAGNOSIS — K7031 Alcoholic cirrhosis of liver with ascites: Secondary | ICD-10-CM | POA: Diagnosis present

## 2023-11-12 DIAGNOSIS — K766 Portal hypertension: Secondary | ICD-10-CM | POA: Diagnosis present

## 2023-11-12 DIAGNOSIS — R040 Epistaxis: Secondary | ICD-10-CM | POA: Diagnosis present

## 2023-11-12 DIAGNOSIS — Z833 Family history of diabetes mellitus: Secondary | ICD-10-CM | POA: Diagnosis not present

## 2023-11-12 DIAGNOSIS — Z811 Family history of alcohol abuse and dependence: Secondary | ICD-10-CM | POA: Diagnosis not present

## 2023-11-12 DIAGNOSIS — N200 Calculus of kidney: Secondary | ICD-10-CM | POA: Diagnosis present

## 2023-11-12 LAB — COMPREHENSIVE METABOLIC PANEL WITH GFR
ALT: 22 U/L (ref 0–44)
AST: 58 U/L — ABNORMAL HIGH (ref 15–41)
Albumin: 1.9 g/dL — ABNORMAL LOW (ref 3.5–5.0)
Alkaline Phosphatase: 73 U/L (ref 38–126)
Anion gap: 9 (ref 5–15)
BUN: 12 mg/dL (ref 6–20)
CO2: 25 mmol/L (ref 22–32)
Calcium: 7.5 mg/dL — ABNORMAL LOW (ref 8.9–10.3)
Chloride: 99 mmol/L (ref 98–111)
Creatinine, Ser: 0.87 mg/dL (ref 0.61–1.24)
GFR, Estimated: >60 mL/min (ref >60)
Glucose, Bld: 102 mg/dL — ABNORMAL HIGH (ref 70–99)
Potassium: 4.3 mmol/L (ref 3.5–5.1)
Sodium: 133 mmol/L — ABNORMAL LOW (ref 135–145)
Total Bilirubin: 3.1 mg/dL — ABNORMAL HIGH (ref 0.0–1.2)
Total Protein: 6.3 g/dL — ABNORMAL LOW (ref 6.5–8.1)

## 2023-11-12 LAB — PREPARE RBC (CROSSMATCH)

## 2023-11-12 LAB — PREPARE PLATELET PHERESIS: Unit division: 0

## 2023-11-12 LAB — IRON AND TIBC
Iron: 17 ug/dL — ABNORMAL LOW (ref 45–182)
Saturation Ratios: 6 % — ABNORMAL LOW (ref 17.9–39.5)
TIBC: 304 ug/dL (ref 250–450)
UIBC: 287 ug/dL

## 2023-11-12 LAB — FOLATE: Folate: 15.4 ng/mL (ref >5.9)

## 2023-11-12 LAB — CBC
HCT: 21.6 % — ABNORMAL LOW (ref 39.0–52.0)
Hemoglobin: 6.5 g/dL — CL (ref 13.0–17.0)
MCH: 25.8 pg — ABNORMAL LOW (ref 26.0–34.0)
MCHC: 30.1 g/dL (ref 30.0–36.0)
MCV: 85.7 fL (ref 80.0–100.0)
Platelets: 23 K/uL — CL (ref 150–400)
RBC: 2.52 MIL/uL — ABNORMAL LOW (ref 4.22–5.81)
RDW: 17.8 % — ABNORMAL HIGH (ref 11.5–15.5)
WBC: 3.5 K/uL — ABNORMAL LOW (ref 4.0–10.5)
nRBC: 0 % (ref 0.0–0.2)

## 2023-11-12 LAB — HEMOGLOBIN AND HEMATOCRIT, BLOOD
HCT: 23.4 % — ABNORMAL LOW (ref 39.0–52.0)
Hemoglobin: 7.3 g/dL — ABNORMAL LOW (ref 13.0–17.0)

## 2023-11-12 LAB — BPAM PLATELET PHERESIS
Blood Product Expiration Date: 202510132359
ISSUE DATE / TIME: 202510101717
Unit Type and Rh: 6200

## 2023-11-12 LAB — VITAMIN B12: Vitamin B-12: 699 pg/mL (ref 180–914)

## 2023-11-12 MED ORDER — SPIRONOLACTONE 25 MG PO TABS: 50.0000 mg | ORAL_TABLET | Freq: Every day | ORAL | Status: DC

## 2023-11-12 MED ORDER — CARVEDILOL 3.125 MG PO TABS: 3.1250 mg | ORAL_TABLET | Freq: Two times a day (BID) | ORAL | Status: DC

## 2023-11-12 MED ORDER — SODIUM CHLORIDE 0.9% IV SOLUTION: Freq: Once | INTRAVENOUS | Status: AC

## 2023-11-12 MED ORDER — PANTOPRAZOLE SODIUM 40 MG PO TBEC
40.0000 mg | DELAYED_RELEASE_TABLET | Freq: Every day | ORAL | Status: DC
  Administered 2023-11-12 – 2023-11-16 (×5): 40 mg via ORAL
  Filled 2023-11-12 (×5): qty 1

## 2023-11-12 MED ORDER — LACTULOSE 10 GM/15ML PO SOLN
20.0000 g | Freq: Every day | ORAL | Status: DC
  Administered 2023-11-12 – 2023-11-16 (×5): 20 g via ORAL
  Filled 2023-11-12 (×5): qty 30

## 2023-11-12 MED ORDER — FUROSEMIDE 40 MG PO TABS: 40.0000 mg | ORAL_TABLET | Freq: Every day | ORAL | Status: DC

## 2023-11-12 NOTE — Consult Note (Addendum)
 Consultation  Referring Provider: TRH/Rashid Primary Care Physician:  Vincente Shivers, NP Primary Gastroenterologist:  Dr.Russo/ [facility] [place]  Reason for Consultation: Decompensated EtOH induced cirrhosis  HPI: Robert Sellers is a 43 y.o. male who was admitted yesterday through the emergency room with complaints of epistaxis intermittently over the past 2 months, and hematuria over the past several days.  He was also having a sense of increased shortness of breath and increased fluid in his abdomen in the setting of known decompensated alcohol-related cirrhosis. Patient has previously been cared for in [place] by [facility]/Dr. Onita, and had been hospitalized at [facility] in the spring 2025 with GI bleeding.  He is known to have esophageal varices and portal gastropathy.  He had undergone banding x 5 in March 2025, then had repeat banding April 2025 and on EGD May 2025 per Dr. Onita for follow-up was noted to have mild portal gastropathy, grade 2 varices in the lower one third of the esophagus, no further bands were placed and he was to follow-up with EGD in 3 months. Patient also with known ascites and had undergone 2 large-volume paracentesis earlier this year 1 in April and 1 in June.  He had been on Aldactone  50 mg daily and Lasix 40 mg daily, as well as Coreg. Patient says that he lost his job and lost his insurance in early June and ran out of all of his medications and therefore has not been taking anything over the past months.  He continues to use alcohol on a regular basis though he says today less than he used to.  His last drink was on Tuesday, he usually drinks several shots per day.  He also had admission to [facility] in July 2025 with cellulitis involving his left foot/toes.  Workup at the ER yesterday with CT of the chest abdomen and pelvis with contrast showed a large right pleural effusion, cirrhotic appearing liver, portal vein patent, and small to moderate amount of  ascites, bilateral nephrolithiasis, no ureteral lithiasis or significant hydronephrosis.  Labs, pro time 21.6/INR 1.8 Sodium 132/potassium 3.5/BUN 19/creatinine 0.78 T. bili 5.0/AST 76/ALT 27/alk phos 84 WBC 5.5/hemoglobin 7.9/hematocrit 26.1/platelets 23,000 EtOH negative Hepatitis serologies negative  Today WBC 3.5/hemoglobin 6.5/hematocrit 21.6/platelets 23 Iron 17/TIBC 304/iron sat 6.  This morning he says he feels a bit less short of breath but has been lying in bed, no complaints of abdominal pain, he does feel that his abdomen is tight.  No melena or hematochezia, no vomiting or hematemesis.  He has been having bleeding from his left nares this morning and has some dried blood along his face, also continues to have hematuria.   MELD 3.0: 22 at 11/12/2023  6:13 AM MELD-Na: 20 at 11/12/2023  6:13 AM Calculated from: Serum Creatinine: 0.87 mg/dL (Using min of 1 mg/dL) at 89/88/7974  3:86 AM Serum Sodium: 133 mmol/L at 11/12/2023  6:13 AM Total Bilirubin: 3.1 mg/dL at 89/88/7974  3:86 AM Serum Albumin : 1.9 g/dL at 89/88/7974  3:86 AM INR(ratio): 1.8 at 11/11/2023  1:05 PM Age at listing (hypothetical): 42 years Sex: Male at 11/12/2023  6:13 AM     Past Medical History:  Diagnosis Date   Acute blood loss anemia 10/01/2022   Acute upper GI bleeding 09/29/2022   Anxiety    Ascites    Clotting disorder    Depression    Hyperlipidemia 12/04/2012   Hypertension    Left shoulder pain 08/28/2015   Substance abuse (HCC)  Past Surgical History:  Procedure Laterality Date   ESOPHAGOGASTRODUODENOSCOPY N/A 04/12/2023   Procedure: EGD (ESOPHAGOGASTRODUODENOSCOPY);  Surgeon: Jinny Carmine, MD;  Location: [facility] ENDOSCOPY;  Service: Endoscopy;  Laterality: N/A;   ESOPHAGOGASTRODUODENOSCOPY N/A 05/13/2023   Procedure: EGD (ESOPHAGOGASTRODUODENOSCOPY);  Surgeon: Onita Elspeth Sharper, DO;  Location: [facility] ENDOSCOPY;  Service: Gastroenterology;  Laterality: N/A;    ESOPHAGOGASTRODUODENOSCOPY N/A 06/03/2023   Procedure: EGD (ESOPHAGOGASTRODUODENOSCOPY);  Surgeon: Onita Elspeth Sharper, DO;  Location: [facility] ENDOSCOPY;  Service: Gastroenterology;  Laterality: N/A;   ESOPHAGOGASTRODUODENOSCOPY (EGD) WITH PROPOFOL  N/A 09/30/2022   Procedure: ESOPHAGOGASTRODUODENOSCOPY (EGD) WITH PROPOFOL ;  Surgeon: Toledo, Ladell POUR, MD;  Location: [facility] ENDOSCOPY;  Service: Gastroenterology;  Laterality: N/A;   GASTRIC VARICES BANDING  04/12/2023   Procedure: BAND LIGATION, GASTRIC VARICES;  Surgeon: Jinny Carmine, MD;  Location: [facility] ENDOSCOPY;  Service: Endoscopy;;   KNEE SURGERY     left shoulder surgery Left 2024   [facility]    Prior to Admission medications   Medication Sig Start Date End Date Taking? Authorizing Provider  sertraline  (ZOLOFT ) 50 MG tablet TAKE 1 TABLET(50 MG) BY MOUTH DAILY Patient not taking: Reported on 11/11/2023 06/02/23   Vincente Shivers, NP    Current Facility-Administered Medications  Medication Dose Route Frequency Provider Last Rate Last Admin   0.9 %  sodium chloride  infusion (Manually program via Guardrails IV Fluids)   Intravenous Once Barrett, Jamie N, PA-C       0.9 %  sodium chloride  infusion (Manually program via Guardrails IV Fluids)   Intravenous Once Rashid, Farhan, MD       acetaminophen  (TYLENOL ) tablet 650 mg  650 mg Oral Q6H PRN Rashid, Farhan, MD   650 mg at 11/11/23 1951   Or   acetaminophen  (TYLENOL ) suppository 650 mg  650 mg Rectal Q6H PRN Rashid, Farhan, MD       albuterol (PROVENTIL) (2.5 MG/3ML) 0.083% nebulizer solution 2.5 mg  2.5 mg Nebulization Q2H PRN Rashid, Farhan, MD       bisacodyl (DULCOLAX) EC tablet 5 mg  5 mg Oral Daily PRN Rashid, Farhan, MD       cefTRIAXone  (ROCEPHIN ) 2 g in sodium chloride  0.9 % 100 mL IVPB  2 g Intravenous Q24H Rashid, Farhan, MD 200 mL/hr at 11/11/23 2150 2 g at 11/11/23 2150   folic acid  (FOLVITE ) tablet 1 mg  1 mg Oral Daily Rashid, Farhan, MD   1 mg at 11/12/23 0831   furosemide (LASIX) tablet  20 mg  20 mg Oral Daily Rashid, Farhan, MD   20 mg at 11/12/23 0831   hydrALAZINE (APRESOLINE) injection 10 mg  10 mg Intravenous Q6H PRN Rashid, Farhan, MD       LORazepam  (ATIVAN ) tablet 1-4 mg  1-4 mg Oral Q1H PRN Rashid, Farhan, MD       Or   LORazepam  (ATIVAN ) injection 1-4 mg  1-4 mg Intravenous Q1H PRN Rashid, Farhan, MD       LORazepam  (ATIVAN ) tablet 0-4 mg  0-4 mg Oral Q6H Rashid, Farhan, MD   1 mg at 11/12/23 0009   Followed by   NOREEN ON 11/13/2023] LORazepam  (ATIVAN ) tablet 0-4 mg  0-4 mg Oral Q12H Rashid, Farhan, MD       methocarbamol (ROBAXIN) injection 500 mg  500 mg Intravenous Q6H PRN Rashid, Farhan, MD       multivitamin with minerals tablet 1 tablet  1 tablet Oral Daily Rashid, Farhan, MD   1 tablet at 11/12/23 9167   ondansetron  (ZOFRAN ) injection 4 mg  4  mg Intravenous Q6H PRN Rashid, Farhan, MD       oxyCODONE  (Oxy IR/ROXICODONE ) immediate release tablet 5 mg  5 mg Oral Q4H PRN Rashid, Farhan, MD   5 mg at 11/12/23 0836   senna-docusate (Senokot-S) tablet 1 tablet  1 tablet Oral QHS PRN Rashid, Farhan, MD       spironolactone  (ALDACTONE ) tablet 12.5 mg  12.5 mg Oral Daily Rashid, Farhan, MD   12.5 mg at 11/12/23 0831   thiamine  (VITAMIN B1) tablet 100 mg  100 mg Oral Daily Rashid, Farhan, MD   100 mg at 11/12/23 0831    Allergies as of 11/11/2023   (No Known Allergies)    Family History  Problem Relation Age of Onset   Heart disease Mother    Hyperlipidemia Mother    Hypertension Mother    Diabetes Mother    Early death Mother    Obesity Mother    Heart disease Father    Hyperlipidemia Father    Hypertension Father    Diabetes Father    Alcohol abuse Father    Early death Father    Kidney disease Father    Obesity Father     Social History   Socioeconomic History   Marital status: Divorced    Spouse name: Not on file   Number of children: 3   Years of education: Not on file   Highest education level: Master's degree (e.g., MA, MS, MEng, MEd, MSW,  MBA)  Occupational History   Not on file  Tobacco Use   Smoking status: Never   Smokeless tobacco: Never  Vaping Use   Vaping status: Never Used  Substance and Sexual Activity   Alcohol use: Not Currently    Alcohol/week: 1.0 standard drink of alcohol    Types: 1 Standard drinks or equivalent per week    Comment: consistent   Drug use: No   Sexual activity: Yes    Partners: Female    Birth control/protection: None    Comment: single partner  Other Topics Concern   Not on file  Social History Narrative   Not on file   Social Drivers of Health   Financial Resource Strain: Low Risk  (08/02/2023)   Received from [facility]   Overall Financial Resource Strain (CARDIA)    Difficulty of Paying Living Expenses: Not hard at all  Food Insecurity: Patient Declined (11/12/2023)   Hunger Vital Sign    Worried About Running Out of Food in the Last Year: Patient declined    Ran Out of Food in the Last Year: Patient declined  Transportation Needs: No Transportation Needs (11/12/2023)   PRAPARE - Administrator, Civil Service (Medical): No    Lack of Transportation (Non-Medical): No  Physical Activity: Insufficiently Active (07/25/2023)   Exercise Vital Sign    Days of Exercise per Week: 2 days    Minutes of Exercise per Session: 30 min  Stress: Stress Concern Present (07/25/2023)   [employer] of Occupational Health - Occupational Stress Questionnaire    Feeling of Stress: To some extent  Social Connections: Socially Isolated (07/25/2023)   Social Connection and Isolation Panel    Frequency of Communication with Friends and Family: Three times a week    Frequency of Social Gatherings with Friends and Family: Once a week    Attends Religious Services: Patient declined    Active Member of Clubs or Organizations: No    Attends Banker Meetings: Not on file  Marital Status: Divorced  Catering manager Violence: Not At Risk (11/12/2023)    Humiliation, Afraid, Rape, and Kick questionnaire    Fear of Current or Ex-Partner: No    Emotionally Abused: No    Physically Abused: No    Sexually Abused: No    Review of Systems: Pertinent positive and negative review of systems were noted in the above HPI section.  All other review of systems was otherwise negative.    Physical Exam: Vital signs in last 24 hours: Temp:  [98.7 F (37.1 C)-101.8 F (38.8 C)] 99.8 F (37.7 C) (10/11 0829) Pulse Rate:  [76-98] 94 (10/11 0829) Resp:  [16-26] 18 (10/11 0829) BP: (115-141)/(73-86) 123/73 (10/11 0829) SpO2:  [90 %-100 %] 97 % (10/11 0829) Weight:  [83.5 kg] 83.5 kg (10/11 0118)   General:   Alert,  Well-developed, thin white male chronically ill-appearing, pleasant and cooperative in NAD dried blood from the left nares O2 2 L Head:  Normocephalic and atraumatic. Eyes:  Sclera clear, no icterus.   Conjunctiva pink. Ears:  Normal auditory acuity. Nose:  No deformity, discharge,  or lesions. Mouth:  No deformity or lesions.   Neck:  Supple; no masses or thyromegaly. Lungs: Decreased breath sounds right lung  heart:  Regular rate and rhythm; no murmurs, clicks, rubs,  or gallops. Abdomen: Significant ascites but nontense, nontender BS active,nonpalp mass or hsm.   Rectal: Not done Msk:  Symmetrical without gross deformities. . Pulses:  Normal pulses noted. Extremities: Edema bilateral lower extremities Neurologic:  Alert and  oriented x4;  grossly normal neurologically.  No asterixis, seems to be mentating well Skin:  Intact without significant lesions or rashes.. Psych:  Alert and cooperative. Normal mood and affect.  Intake/Output from previous day: 10/10 0701 - 10/11 0700 In: 275 [Blood:175; IV Piggyback:100] Out: -  Intake/Output this shift: No intake/output data recorded.  Lab Results: Recent Labs    11/11/23 1305 11/12/23 0613  WBC 5.5 3.5*  HGB 7.9* 6.5*  HCT 26.1* 21.6*  PLT 23* 23*   BMET Recent Labs     11/11/23 1305 11/12/23 0613  NA 132* 133*  K 3.5 4.3  CL 99 99  CO2 23 25  GLUCOSE 121* 102*  BUN 11 12  CREATININE 0.78 0.87  CALCIUM  7.6* 7.5*   LFT Recent Labs    11/12/23 0613  PROT 6.3*  ALBUMIN  1.9*  AST 58*  ALT 22  ALKPHOS 73  BILITOT 3.1*   PT/INR Recent Labs    11/11/23 1305  LABPROT 21.6*  INR 1.8*   Hepatitis Panel Recent Labs    11/11/23 1305  HEPBSAG NON REACTIVE  HCVAB NON REACTIVE  HEPAIGM NON REACTIVE  HEPBIGM NON REACTIVE      IMPRESSION:  #49 43 year old white male with EtOH use disorder and severely decompensated alcohol-induced cirrhosis which has been complicated by esophageal varices, portal gastropathy, previous GI bleeding, ascites, coagulopathy and severe thrombocytopenia. Patient admitted through the emergency room yesterday after presenting with intermittent epistaxis over the past couple of months and then new onset of hematuria over the past several days.  .MEL D NA=22  Platelet count 23,000 on admission/INR 1.8  CT imaging shows mild to moderate ascites, and a new right pleural effusion likely hepatic hydrothorax.  Patient lost his insurance about 5 months ago and has not been on any of his regular medications as prescriptions had run out.  Continues to drink alcohol, last EtOH 4 days ago.  Epistaxis and hematuria both exacerbated by  severe thrombocytopenia Fortunately no evidence of active GI bleeding  #2 history of esophageal varices with bleed March 2025-he had 2 sessions of banding done and last EGD May 2025 with grade 2 varices, no bands were placed, noted to have mild portal gastropathy. Patient has not been on Coreg over the past several months  #3 anemia progressive, in setting of hematuria and epistaxis with severe thrombocytopenia #4 ascites-not new-would benefit from paracentesis but not critical at this time, and unable to pursue with severe thrombocytopenia. Resuming diuretics, will increase Lasix to 40 mg  daily and Aldactone  50 mg daily  #5 right pleural effusion/hepatic hydrothorax again we will manage with 2 g sodium diet and diuretics for now  #6 iron deficiency anemia-secondary to above and also with known portal gastropathy  Plan; 2 g sodium diet Resume Coreg 3.125 twice daily Increase Lasix to 40 mg daily/Aldactone  50 mg daily Protonix  40 mg once daily Lactulose  30 cc daily CIWA protocol Transfuse as indicated, continue platelet transfusions as indicated for epistaxis and hematuria Unable to pursue paracentesis or thoracentesis at present due to severe thrombocytopenia.  If counts improve, can consider later this admission Check AFP for [facility] surveillance Will start vitamin K 5 mg orally daily x 3 days Should have repeat EGD at some point for variceal surveillance, can consider later this admission again if thrombocytopenia allows. Encouraged patient to apply for Medicaid  Can return to the care of [facility] on discharge   Amy Esterwood PA-C 11/12/2023, 10:04 AM   ATTENDING ADDENDUM 43 year old male with history of decompensated alcoholic cirrhosis complicated by esophageal varices with bleeding status post banding, ascites, admitted to the hospital with epistaxis in the setting of severe thrombocytopenia and hematuria.  Also noted to have shortness of breath and worsening ascites.  He continues to drink alcohol regularly.  Severe thrombocytopenia with platelets of 20s.  He has associated anemia from his bleeding but has not had any overt GI bleeding or suspicion for repeated variceal bleed.  He has lost his job and insurance and this has not been taking any of his medications.  On imaging he has been found to have new pleural effusion, likely hepatic hydrothorax.  He has not had thoracentesis or paracentesis due to concern for risk of bleeding given severe thrombocytopenia and coagulopathy.  He has had his diuretics resumed which I agree with and hopefully between this and low  sodium diet over time he can get some of this volume off.  I do think he will eventually need to be back on nonspecific beta-blockade for his portal hypertension and varices however I would hold that right now as this could make it harder to get fluid off and do not want to impair his renal function.  Hopefully we can resume Coreg in the upcoming days.  We spent some time discussing his alcohol use, he has never had long-term abstinence.  Recommended complete alcohol abstinence, discussed stakes of this are quite high.  He is decompensated and will likely progress without abstinence further.  Without abstinence he is not a candidate for liver transplant should he continue to deteriorate, which would be expected if he continues to drink alcohol.  Agree with CIWA protocol, continuing PPI, continue diuretics, low-sodium diet, and lactulose .  Agree with vitamin K, monitor hemoglobin and response to transfusion.  We will follow, call with questions in the interim.  Marcey Naval, MD [facility] Gastroenterology

## 2023-11-12 NOTE — Progress Notes (Addendum)
 PROGRESS NOTE    Robert Sellers  FMW:[id number] DOB: Jun 11, 1980 DOA: 11/11/2023 PCP: Vincente Shivers, NP   Brief Narrative:   43 y.o. male with medical history significant of multiple comorbidities including alcoholic cirrhosis, complicated by ascites, portal hypertension and esophageal varices, nephrolithiasis, who presented to the ED with chief complaint of shortness of breath, abdominal pain, nasal bleed as well as blood in urine. GI consulted.  Assessment & Plan:  Principal Problem:   Epistaxis   Epistaxis: In the setting of thrombocytopenia. Stable  S/p transfusion of one unit of platelet Will be transfused a unit of PRBC today.   Hematuria: In the setting of thrombocytopenia and h/o nephrolithiasis CT abdomen didn't show any obstruction/hydronephrosis   Alcoholic cirrhosis complicated by abdominopelvic ascites, portal hypertension and esophageal varices,POA: GI consulted Continue with lasix 40 mg daily and aldactone  50 mg daily Started on coreg 3.125 mg PO BID F/u AFP levels Continue with lactulose  Child pugh class C (Life expectancy 1-3 years) MELD Na 22 indicating 3 month mortality of 20%   Thrombocytopenia: PLT of 23, s/p one unit of platelet transfusion last night Ordered CBC to be checked in am S/p esophageal banding done earlier this year Continues to drink May need IR consult if ascites is significant but thrombocytopenia needs to be addressed before that.  Acute blood loss anemia: Hb 6.5. Ordered one unit of PRBC. In the setting of hematuria and slight epistaxia    Right sided pleural effusion,POA: -Likely hepatic hydrothorax -Appreciate pulm consult. May need thoracentesis sooner than later.     Alcohol abuse,POA: Continue with thiamine  and folate Alcohol cessation counseling TOC consult  Placed on CIWA protocol   Disposition: Home    DVT prophylaxis: SCDs Start: 11/11/23 1713     Code Status: Full Code Family Communication:  None at the  bedside Status is: Observation The patient remains OBS appropriate and will d/c before 2 midnights.    Subjective:  Feels better today. Epistaxis has been stable, Still having some blood in his urine. We spoke about transfusing a unit of PRBC. He did receive a unit of platelets last night.  Examination:  General exam: Appears calm and comfortable  Respiratory system: Diminished sounds over right hemithorax Cardiovascular system: S1 & S2 heard, RRR. No JVD, murmurs, rubs, gallops or clicks. No pedal edema. Gastrointestinal system: Slightly distended with positive fluid shift and thrill Central nervous system: Alert and oriented. No focal neurological deficits. Extremities: Symmetric 5 x 5 power. Skin: No rashes, lesions or ulcers Psychiatry: Judgement and insight appear normal. Mood & affect appropriate.       Diet Orders (From admission, onward)     Start     Ordered   11/11/23 1727  Diet Heart Room service appropriate? Yes; Fluid consistency: Thin; Fluid restriction: 1500 mL Fluid  Diet effective now       Question Answer Comment  Room service appropriate? Yes   Fluid consistency: Thin   Fluid restriction: 1500 mL Fluid      11/11/23 1726            Objective: Vitals:   11/12/23 0118 11/12/23 0415 11/12/23 0727 11/12/23 0829  BP: 121/76 122/81  123/73  Pulse: 84 89  94  Resp: 18 19  18   Temp: (!) 101.8 F (38.8 C) 99.3 F (37.4 C)  99.8 F (37.7 C)  TempSrc: Oral Oral  Oral  SpO2: 98% 97% 95% 97%  Weight:      Height:  Intake/Output Summary (Last 24 hours) at 11/12/2023 0942 Last data filed at 11/11/2023 2150 Gross per 24 hour  Intake 275 ml  Output --  Net 275 ml   Filed Weights   11/12/23 0118  Weight: 83.5 kg    Scheduled Meds:  sodium chloride    Intravenous Once   sodium chloride    Intravenous Once   folic acid   1 mg Oral Daily   furosemide  20 mg Oral Daily   LORazepam   0-4 mg Oral Q6H   Followed by   NOREEN ON 11/13/2023]  LORazepam   0-4 mg Oral Q12H   multivitamin with minerals  1 tablet Oral Daily   spironolactone   12.5 mg Oral Daily   thiamine   100 mg Oral Daily   Continuous Infusions:  cefTRIAXone  (ROCEPHIN )  IV 2 g (11/11/23 2150)    Nutritional status     Body mass index is 26.4 kg/m.  Data Reviewed:   CBC: Recent Labs  Lab 11/11/23 1305 11/12/23 0613  WBC 5.5 3.5*  NEUTROABS 4.4  --   HGB 7.9* 6.5*  HCT 26.1* 21.6*  MCV 86.1 85.7  PLT 23* 23*   Basic Metabolic Panel: Recent Labs  Lab 11/11/23 1305 11/12/23 0613  NA 132* 133*  K 3.5 4.3  CL 99 99  CO2 23 25  GLUCOSE 121* 102*  BUN 11 12  CREATININE 0.78 0.87  CALCIUM  7.6* 7.5*   GFR: Estimated Creatinine Clearance: 114.2 mL/min (by C-G formula based on SCr of 0.87 mg/dL). Liver Function Tests: Recent Labs  Lab 11/11/23 1305 11/12/23 0613  AST 76* 58*  ALT 27 22  ALKPHOS 84 73  BILITOT 5.0* 3.1*  PROT 7.3 6.3*  ALBUMIN  2.3* 1.9*   Recent Labs  Lab 11/11/23 1305  LIPASE 31   No results for input(s): AMMONIA in the last 168 hours. Coagulation Profile: Recent Labs  Lab 11/11/23 1305  INR 1.8*   Cardiac Enzymes: No results for input(s): CKTOTAL, CKMB, CKMBINDEX, TROPONINI in the last 168 hours. BNP (last 3 results) No results for input(s): PROBNP in the last 8760 hours. HbA1C: No results for input(s): HGBA1C in the last 72 hours. CBG: No results for input(s): GLUCAP in the last 168 hours. Lipid Profile: No results for input(s): CHOL, HDL, LDLCALC, TRIG, CHOLHDL, LDLDIRECT in the last 72 hours. Thyroid  Function Tests: No results for input(s): TSH, T4TOTAL, FREET4, T3FREE, THYROIDAB in the last 72 hours. Anemia Panel: Recent Labs    11/12/23 0613  VITAMINB12 699  FOLATE 15.4  TIBC 304  IRON 17*   Sepsis Labs: Recent Labs  Lab 11/11/23 1318 11/11/23 1619  LATICACIDVEN 2.6* 2.0*    No results found for this or any previous visit (from the past 240  hours).       Radiology Studies: CT CHEST ABDOMEN PELVIS W CONTRAST Result Date: 11/11/2023 EXAM: CT CHEST, ABDOMEN AND PELVIS WITH CONTRAST 11/11/2023 04:49:15 PM TECHNIQUE: CT of the chest, abdomen and pelvis was performed with the administration of 75 mL of iohexol  (OMNIPAQUE ) 350 MG/ML injection. Multiplanar reformatted images are provided for review. Automated exposure control, iterative reconstruction, and/or weight based adjustment of the mA/kV was utilized to reduce the radiation dose to as low as reasonably achievable. COMPARISON: 04/22/2023 CLINICAL HISTORY: RLQ abdominal pain. FINDINGS: CHEST: MEDIASTINUM AND LYMPH NODES: Heart and pericardium are unremarkable. Mild scattered coronary calcifications. There is mild leftward shift of the mediastinum. The central airways are clear. No mediastinal, hilar or axillary lymphadenopathy. LUNGS AND PLEURA: Large right pleural effusion with  significant atelectasis in the right lung, new since previous. Left lung remains clear. No pneumothorax. ABDOMEN AND PELVIS: LIVER: Mildly nodular hepatic contour without focal lesion. Portal vein patent. Small to moderate volume abdominal ascites mostly in the pelvis and perihepatic. GALLBLADDER AND BILE DUCTS: Gallbladder is physiologically distended. No biliary ductal dilatation. SPLEEN: Splenomegaly without focal lesion. Reconstituted falciform ligament venous collateral channels. PANCREAS: No acute abnormality. ADRENAL GLANDS: No acute abnormality. KIDNEYS, URETERS AND BLADDER: Bilateral nephrolithiasis, largest 1 cm right lower pole. No ureteral calculus or significant hydronephrosis. No perinephric or periureteral stranding. Urinary bladder is unremarkable. GI AND BOWEL: A few gas and fluid distended right upper quadrant small bowel loops. The stomach and distal small bowel remain decompressed. Colon is nondistended. There is no bowel obstruction. REPRODUCTIVE ORGANS: Mild prostate enlargement with central coarse  calcifications. PERITONEUM AND RETROPERITONEUM: Small to moderate volume abdominal ascites mostly in the pelvis and perihepatic. No free air. VASCULATURE: Aorta is normal in caliber. ABDOMINAL AND PELVIS LYMPH NODES: No lymphadenopathy. 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 No acute osseous abnormality otherwise. No focal soft tissue abnormality. IMPRESSION: 1. Large right pleural effusion or hepatic hydrothorax causing significant right lung atelectasis and mild leftward mediastinal shift. 2. Small to moderate abdominal ascites. 3. Hepatic cirrhosis with stigmata of portal venous hypertension 4. Bilateral nephrolithiasis, largest 1 cm in the right lower pole, without ureteral calculus or significant hydronephrosis. 5. 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 Electronically signed by: Dayne Hassell MD 11/11/2023 05:04 PM EDT RP Workstation: [id number]   DG Chest 2 View Result Date: 11/11/2023 EXAM: 2 VIEW(S) XRAY OF THE CHEST 11/11/2023 02:32:00 PM COMPARISON: 03/02/2018 CLINICAL HISTORY: SHOB. FINDINGS: LUNGS AND PLEURA: Large and possibly loculated right pleural effusion is noted. No focal pulmonary opacity. No pulmonary edema. No pneumothorax. HEART AND MEDIASTINUM: No acute abnormality of the cardiac and mediastinal silhouettes. BONES AND SOFT TISSUES: No acute osseous abnormality. IMPRESSION: 1. Large, possibly loculated right pleural effusion. Electronically signed by: Lynwood Seip MD 11/11/2023 02:52 PM EDT RP Workstation: [id number]           LOS: 0 days   Time spent= 39 mins    Deliliah Room, MD [facility]  If 7PM-7AM, please contact night-coverage  11/12/2023, 9:42 AM

## 2023-11-12 NOTE — Plan of Care (Signed)

## 2023-11-12 NOTE — Consult Note (Signed)
 NAME:  Robert Sellers, MRN:  [id number], DOB:  [birth year], LOS: 0 ADMISSION DATE:  11/11/2023, CONSULTATION DATE:  11/12/23 REFERRING MD:  Dr Deliliah, CHIEF COMPLAINT:  R pleural effusion   History of Present Illness:  43 year old male with a past medical history of alcoholic liver cirrhosis complicated by portal hypertension, esophageal varices and portal gastropathy presenting to the hospital with epistaxis, shortness of breath and abdominal distention due to decompensated liver cirrhosis.   Here in the hospital he is noted to have low-grade fevers with Tmax 101.  He is also on 2 L oxygen although no hypoxia episodes in his chart. His laboratory workup is significant for mild hyponatremia, no AKI, slight elevation in AST, leukopenia, anemia and thrombocytopenia with a platelet count of 23.  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  He is also noted to have moderate volume abdominopelvic ascites and anasarca.  Pulmonary was consulted for management of his right pleural effusion.   At bedside, the patient is not in any respiratory distress.  He states that his breathing was worse yesterday but it slightly improved today.  He is currently on 2 L oxygen.  He states that he has never had a thoracentesis but has had paracentesis in the past.  He continues to drink alcohol but he is trying to cut down.   Pertinent  Medical History  As mentioned above  Significant Hospital Events: Including procedures, antibiotic start and stop dates in addition to other pertinent events     Interim History / Subjective:  N/A  Objective    Blood pressure 131/75, pulse 97, temperature 99.9 F (37.7 C), resp. rate 18, height 5' 10 (1.778 m), weight 83.5 kg, SpO2 93%.        Intake/Output Summary (Last 24 hours) at 11/12/2023 1109 Last data filed at 11/11/2023 2150 Gross per 24 hour  Intake 275 ml  Output --   Net 275 ml   Filed Weights   11/12/23 0118  Weight: 83.5 kg    Examination: General: Middle-aged male not in acute distress HENT: Moist mucous membranes Lungs: Minimal air entry on the right lung, clear entry on the left lung Cardiovascular: RRR, normal S1, S2 Abdomen: Soft, positive bowel sounds, slightly distended Extremities: No edema Neuro: Motor and sensation grossly intact   Resolved problem list   Assessment and Plan  This is a 43 year old male with alcoholic liver cirrhosis presenting with epistaxis, ascites and right-sided pleural effusion likely in the setting of hepatic hydrothorax.  The only concern is he is spiking low-grade fevers.  Thoracentesis is still doable but with slight increase risk of bleeding given thrombocytopenia.  # Right-sided pleural effusion likely hepatic hydrothorax Main concern is he is spiking low-grade fevers with unclear source at this time.  However this is still very likely to be a hepatic hydrothorax that we will very quickly recur on drainage unless he also gets a paracentesis and aggressive diuretic therapy.  Agree with GI to do a trial of diuresis at this time.  If he has any worsening respiratory distress then we are happy to do a thoracentesis for him even with his low platelet count.  Recommend continue antibiotic therapy at this time empirically.  If he undergoes a thoracentesis would likely give a unit of platelet running while performing the procedure.  Thoracentesis should be performed at some point if his platelets improve at least for diagnostic purposes.   PCCM will  continue to follow.  Please reach out if he develops any respiratory distress.    Labs   CBC: Recent Labs  Lab 11/11/23 1305 11/12/23 0613  WBC 5.5 3.5*  NEUTROABS 4.4  --   HGB 7.9* 6.5*  HCT 26.1* 21.6*  MCV 86.1 85.7  PLT 23* 23*    Basic Metabolic Panel: Recent Labs  Lab 11/11/23 1305 11/12/23 0613  NA 132* 133*  K 3.5 4.3  CL 99 99  CO2 23 25   GLUCOSE 121* 102*  BUN 11 12  CREATININE 0.78 0.87  CALCIUM  7.6* 7.5*   GFR: Estimated Creatinine Clearance: 114.2 mL/min (by C-G formula based on SCr of 0.87 mg/dL). Recent Labs  Lab 11/11/23 1305 11/11/23 1318 11/11/23 1619 11/12/23 0613  WBC 5.5  --   --  3.5*  LATICACIDVEN  --  2.6* 2.0*  --     Liver Function Tests: Recent Labs  Lab 11/11/23 1305 11/12/23 0613  AST 76* 58*  ALT 27 22  ALKPHOS 84 73  BILITOT 5.0* 3.1*  PROT 7.3 6.3*  ALBUMIN  2.3* 1.9*   Recent Labs  Lab 11/11/23 1305  LIPASE 31   No results for input(s): AMMONIA in the last 168 hours.  ABG No results found for: PHART, PCO2ART, PO2ART, HCO3, TCO2, ACIDBASEDEF, O2SAT   Coagulation Profile: Recent Labs  Lab 11/11/23 1305  INR 1.8*    Cardiac Enzymes: No results for input(s): CKTOTAL, CKMB, CKMBINDEX, TROPONINI in the last 168 hours.  HbA1C: No results found for: HGBA1C  CBG: No results for input(s): GLUCAP in the last 168 hours.  Review of Systems:   As above  Past Medical History:  He,  has a past medical history of Acute blood loss anemia (10/01/2022), Acute upper GI bleeding (09/29/2022), Anxiety, Ascites, Clotting disorder, Depression, Hyperlipidemia (12/04/2012), Hypertension, Left shoulder pain (08/28/2015), and Substance abuse (HCC).   Surgical History:   Past Surgical History:  Procedure Laterality Date   ESOPHAGOGASTRODUODENOSCOPY N/A 04/12/2023   Procedure: EGD (ESOPHAGOGASTRODUODENOSCOPY);  Surgeon: Jinny Carmine, MD;  Location: [facility] ENDOSCOPY;  Service: Endoscopy;  Laterality: N/A;   ESOPHAGOGASTRODUODENOSCOPY N/A 05/13/2023   Procedure: EGD (ESOPHAGOGASTRODUODENOSCOPY);  Surgeon: Onita Elspeth Sharper, DO;  Location: [facility] ENDOSCOPY;  Service: Gastroenterology;  Laterality: N/A;   ESOPHAGOGASTRODUODENOSCOPY N/A 06/03/2023   Procedure: EGD (ESOPHAGOGASTRODUODENOSCOPY);  Surgeon: Onita Elspeth Sharper, DO;  Location: [facility] ENDOSCOPY;  Service:  Gastroenterology;  Laterality: N/A;   ESOPHAGOGASTRODUODENOSCOPY (EGD) WITH PROPOFOL  N/A 09/30/2022   Procedure: ESOPHAGOGASTRODUODENOSCOPY (EGD) WITH PROPOFOL ;  Surgeon: Toledo, Ladell POUR, MD;  Location: [facility] ENDOSCOPY;  Service: Gastroenterology;  Laterality: N/A;   GASTRIC VARICES BANDING  04/12/2023   Procedure: BAND LIGATION, GASTRIC VARICES;  Surgeon: Jinny Carmine, MD;  Location: [facility] ENDOSCOPY;  Service: Endoscopy;;   KNEE SURGERY     left shoulder surgery Left 2024   [facility]     Social History:   reports that he has never smoked. He has never used smokeless tobacco. He reports that he does not currently use alcohol after a past usage of about 1.0 standard drink of alcohol per week. He reports that he does not use drugs.   Family History:  His family history includes Alcohol abuse in his father; Diabetes in his father and mother; Early death in his father and mother; Heart disease in his father and mother; Hyperlipidemia in his father and mother; Hypertension in his father and mother; Kidney disease in his father; Obesity in his father and mother.   Allergies No Known Allergies  Home Medications  Prior to Admission medications   Medication Sig Start Date End Date Taking? Authorizing Provider  sertraline  (ZOLOFT ) 50 MG tablet TAKE 1 TABLET(50 MG) BY MOUTH DAILY Patient not taking: Reported on 11/11/2023 06/02/23   Vincente Shivers, NP

## 2023-11-13 ENCOUNTER — Inpatient Hospital Stay (HOSPITAL_COMMUNITY): Payer: Self-pay

## 2023-11-13 DIAGNOSIS — R188 Other ascites: Secondary | ICD-10-CM

## 2023-11-13 DIAGNOSIS — R040 Epistaxis: Secondary | ICD-10-CM

## 2023-11-13 LAB — TYPE AND SCREEN
ABO/RH(D): O POS
Antibody Screen: NEGATIVE
Unit division: 0

## 2023-11-13 LAB — AFP TUMOR MARKER: AFP, Serum, Tumor Marker: 2.1 ng/mL (ref 0.0–6.9)

## 2023-11-13 LAB — COMPREHENSIVE METABOLIC PANEL WITH GFR
ALT: 21 U/L (ref 0–44)
AST: 50 U/L — ABNORMAL HIGH (ref 15–41)
Albumin: 1.8 g/dL — ABNORMAL LOW (ref 3.5–5.0)
Alkaline Phosphatase: 91 U/L (ref 38–126)
Anion gap: 14 (ref 5–15)
BUN: 8 mg/dL (ref 6–20)
CO2: 24 mmol/L (ref 22–32)
Calcium: 7.2 mg/dL — ABNORMAL LOW (ref 8.9–10.3)
Chloride: 97 mmol/L — ABNORMAL LOW (ref 98–111)
Creatinine, Ser: 0.67 mg/dL (ref 0.61–1.24)
GFR, Estimated: >60 mL/min (ref >60)
Glucose, Bld: 109 mg/dL — ABNORMAL HIGH (ref 70–99)
Potassium: 3.6 mmol/L (ref 3.5–5.1)
Sodium: 135 mmol/L (ref 135–145)
Total Bilirubin: 2.5 mg/dL — ABNORMAL HIGH (ref 0.0–1.2)
Total Protein: 6.1 g/dL — ABNORMAL LOW (ref 6.5–8.1)

## 2023-11-13 LAB — CBC WITH DIFFERENTIAL/PLATELET
Abs Immature Granulocytes: 0.01 K/uL (ref 0.00–0.07)
Basophils Absolute: 0 K/uL (ref 0.0–0.1)
Basophils Relative: 1 %
Eosinophils Absolute: 0.1 K/uL (ref 0.0–0.5)
Eosinophils Relative: 2 %
HCT: 23.3 % — ABNORMAL LOW (ref 39.0–52.0)
Hemoglobin: 7.1 g/dL — ABNORMAL LOW (ref 13.0–17.0)
Immature Granulocytes: 0 %
Lymphocytes Relative: 31 %
Lymphs Abs: 0.9 K/uL (ref 0.7–4.0)
MCH: 26.3 pg (ref 26.0–34.0)
MCHC: 30.5 g/dL (ref 30.0–36.0)
MCV: 86.3 fL (ref 80.0–100.0)
Monocytes Absolute: 0.3 K/uL (ref 0.1–1.0)
Monocytes Relative: 11 %
Neutro Abs: 1.6 K/uL — ABNORMAL LOW (ref 1.7–7.7)
Neutrophils Relative %: 55 %
Platelets: 23 K/uL — CL (ref 150–400)
RBC: 2.7 MIL/uL — ABNORMAL LOW (ref 4.22–5.81)
RDW: 17.3 % — ABNORMAL HIGH (ref 11.5–15.5)
WBC: 2.9 K/uL — ABNORMAL LOW (ref 4.0–10.5)
nRBC: 0 % (ref 0.0–0.2)

## 2023-11-13 LAB — GLUCOSE, PLEURAL OR PERITONEAL FLUID: Glucose, Fluid: 125 mg/dL

## 2023-11-13 LAB — LACTATE DEHYDROGENASE, PLEURAL OR PERITONEAL FLUID: LD, Fluid: 45 U/L — ABNORMAL HIGH (ref 3–23)

## 2023-11-13 LAB — ALBUMIN, PLEURAL OR PERITONEAL FLUID: Albumin, Fluid: <1.5 g/dL

## 2023-11-13 LAB — BODY FLUID CELL COUNT WITH DIFFERENTIAL
Eos, Fluid: 2 %
Lymphs, Fluid: 11 %
Monocyte-Macrophage-Serous Fluid: 2 % — ABNORMAL LOW (ref 50–90)
Neutrophil Count, Fluid: 85 % — ABNORMAL HIGH (ref 0–25)
Total Nucleated Cell Count, Fluid: 461 uL (ref 0–1000)

## 2023-11-13 LAB — BPAM RBC
Blood Product Expiration Date: 202511032359
ISSUE DATE / TIME: 202510111013
Unit Type and Rh: 5100

## 2023-11-13 LAB — PROTEIN, PLEURAL OR PERITONEAL FLUID: Total protein, fluid: <3 g/dL

## 2023-11-13 MED ORDER — SODIUM CHLORIDE 0.9% IV SOLUTION: Freq: Once | INTRAVENOUS | Status: AC

## 2023-11-13 MED ORDER — SPIRONOLACTONE 25 MG PO TABS
100.0000 mg | ORAL_TABLET | Freq: Every day | ORAL | Status: DC
  Administered 2023-11-13 – 2023-11-16 (×4): 100 mg via ORAL
  Filled 2023-11-13 (×4): qty 4

## 2023-11-13 MED ORDER — FUROSEMIDE 40 MG PO TABS
40.0000 mg | ORAL_TABLET | Freq: Two times a day (BID) | ORAL | Status: DC
  Administered 2023-11-13 – 2023-11-16 (×7): 40 mg via ORAL
  Filled 2023-11-13 (×7): qty 1

## 2023-11-13 NOTE — Progress Notes (Signed)
 Progress Note   Subjective  Patient still feels shortness of breath. He denies pain. States he is urinating well. Ate breakfast. No bleeding symptoms.   Objective   Vital signs in last 24 hours: Temp:  [98.5 F (36.9 C)-100.4 F (38 C)] 98.7 F (37.1 C) (10/12 0745) Pulse Rate:  [86-97] 96 (10/12 0745) Resp:  [16-20] 20 (10/12 0745) BP: (118-139)/(72-90) 139/81 (10/12 0745) SpO2:  [91 %-97 %] 96 % (10/12 0745) Last BM Date :  (PTA) General:    white male in NAD Extremities:  Without edema. Neurologic:  Alert and oriented,  grossly normal neurologically. Psych:  Cooperative. Normal mood and affect.  Intake/Output from previous day: 10/11 0701 - 10/12 0700 In: 500 [Blood:500] Out: 700 [Urine:700] Intake/Output this shift: No intake/output data recorded.  Lab Results: Recent Labs    11/11/23 1305 11/12/23 0613 11/12/23 1437 11/13/23 0703  WBC 5.5 3.5*  --  2.9*  HGB 7.9* 6.5* 7.3* 7.1*  HCT 26.1* 21.6* 23.4* 23.3*  PLT 23* 23*  --  23*   BMET Recent Labs    11/11/23 1305 11/12/23 0613 11/13/23 0703  NA 132* 133* 135  K 3.5 4.3 3.6  CL 99 99 97*  CO2 23 25 24   GLUCOSE 121* 102* 109*  BUN 11 12 8   CREATININE 0.78 0.87 0.67  CALCIUM  7.6* 7.5* 7.2*   LFT Recent Labs    11/13/23 0703  PROT 6.1*  ALBUMIN  1.8*  AST 50*  ALT 21  ALKPHOS 91  BILITOT 2.5*   PT/INR Recent Labs    11/11/23 1305  LABPROT 21.6*  INR 1.8*    Studies/Results: CT CHEST ABDOMEN PELVIS W CONTRAST Result Date: 11/11/2023 EXAM: CT CHEST, ABDOMEN AND PELVIS WITH CONTRAST 11/11/2023 04:49:15 PM TECHNIQUE: CT of the chest, abdomen and pelvis was performed with the administration of 75 mL of iohexol  (OMNIPAQUE ) 350 MG/ML injection. Multiplanar reformatted images are provided for review. Automated exposure control, iterative reconstruction, and/or weight based adjustment of the mA/kV was utilized to reduce the radiation dose to as low as reasonably achievable. COMPARISON:  04/22/2023 CLINICAL HISTORY: RLQ abdominal pain. FINDINGS: CHEST: MEDIASTINUM AND LYMPH NODES: Heart and pericardium are unremarkable. Mild scattered coronary calcifications. There is mild leftward shift of the mediastinum. The central airways are clear. No mediastinal, hilar or axillary lymphadenopathy. LUNGS AND PLEURA: Large right pleural effusion with significant atelectasis in the right lung, new since previous. Left lung remains clear. No pneumothorax. ABDOMEN AND PELVIS: LIVER: Mildly nodular hepatic contour without focal lesion. Portal vein patent. Small to moderate volume abdominal ascites mostly in the pelvis and perihepatic. GALLBLADDER AND BILE DUCTS: Gallbladder is physiologically distended. No biliary ductal dilatation. SPLEEN: Splenomegaly without focal lesion. Reconstituted falciform ligament venous collateral channels. PANCREAS: No acute abnormality. ADRENAL GLANDS: No acute abnormality. KIDNEYS, URETERS AND BLADDER: Bilateral nephrolithiasis, largest 1 cm right lower pole. No ureteral calculus or significant hydronephrosis. No perinephric or periureteral stranding. Urinary bladder is unremarkable. GI AND BOWEL: A few gas and fluid distended right upper quadrant small bowel loops. The stomach and distal small bowel remain decompressed. Colon is nondistended. There is no bowel obstruction. REPRODUCTIVE ORGANS: Mild prostate enlargement with central coarse calcifications. PERITONEUM AND RETROPERITONEUM: Small to moderate volume abdominal ascites mostly in the pelvis and perihepatic. No free air. VASCULATURE: Aorta is normal in caliber. ABDOMINAL AND PELVIS LYMPH NODES: No lymphadenopathy. 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 No acute osseous abnormality otherwise. No focal soft  tissue abnormality. IMPRESSION: 1. Large right pleural effusion or hepatic hydrothorax causing significant right lung atelectasis and mild leftward mediastinal shift. 2.  Small to moderate abdominal ascites. 3. Hepatic cirrhosis with stigmata of portal venous hypertension 4. Bilateral nephrolithiasis, largest 1 cm in the right lower pole, without ureteral calculus or significant hydronephrosis. 5. 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 Electronically signed by: Dayne Hassell MD 11/11/2023 05:04 PM EDT RP Workstation: [id number]   DG Chest 2 View Result Date: 11/11/2023 EXAM: 2 VIEW(S) XRAY OF THE CHEST 11/11/2023 02:32:00 PM COMPARISON: 03/02/2018 CLINICAL HISTORY: SHOB. FINDINGS: LUNGS AND PLEURA: Large and possibly loculated right pleural effusion is noted. No focal pulmonary opacity. No pulmonary edema. No pneumothorax. HEART AND MEDIASTINUM: No acute abnormality of the cardiac and mediastinal silhouettes. BONES AND SOFT TISSUES: No acute osseous abnormality. IMPRESSION: 1. Large, possibly loculated right pleural effusion. 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  He has severe thrombocytopenia which is prohibiting thoracentesis/paracentesis at this time.  In the setting mainstay of treatment for his ascites and hepatic hydrothorax would be to resume diuretics and titrate up as tolerated, and continue low-sodium diet.  His renal function appears stable at this time on Lasix 40 mg and Aldactone  50 mg.  I think we can increase dosing of diuretics, will try Lasix 40 mg twice daily and Aldactone  100 mg with close monitoring of his renal function.  I would hold off on his Coreg right now given his active issues with ascites.   Hopefully as his volume status improves Coreg can be reintroduced in the upcoming days if his blood pressure tolerates it.  Ultimately he needs to stay off alcohol, has not been able to quit and we had a frank discussion about this yesterday.  He needs to go to rehab for alcohol following this admission.  If he fails to respond to diuretics, could consider a platelet transfusion and thoracentesis to improve his respiratory status.  TIPS is also a consideration however his MELD could be prohibitive.  Would consider TIPS evaluation if he does not respond to conservative measures.  He has been off all medications, continued alcohol use, and was not seeing his GI physician as an outpatient.  Hopefully with alcohol abstinence and resuming his medications he can become more compensated.  He can follow-up with his primary GI as outpatient following discharge.  RECOMMEND: - continue low Na diet  - increase lasix to 40mg  BID and aldactone  to 100mg  / day, close monitoring of renal function - CIWA scale, monitor for EtOH withdrawal - holding Coreg for now, reintroduce if volume status improves and his BP can tolerate it - if platelets rise above 50, recommend thoracentesis / paracentesis - consideration for TIPS if hepatic hydrothorax persists - continue lactulose   - patient can follow up with his primary GI MD upon discharge  We will sign off for now, call with questions moving forward.  Marcey Naval, MD [facility]

## 2023-11-13 NOTE — Progress Notes (Addendum)
 PROGRESS NOTE    Robert Sellers  FMW:[id number] DOB: [birth year] DOA: 11/11/2023 PCP: Vincente Shivers, NP   Brief Narrative:   43 y.o. male with medical history significant of multiple comorbidities including alcoholic cirrhosis, complicated by ascites, portal hypertension and esophageal varices, nephrolithiasis, who presented to the ED with chief complaint of shortness of breath, abdominal pain, nasal bleed as well as blood in urine. GI consulted.  Assessment & Plan:  Principal Problem:   Epistaxis Active Problems:   Decompensated cirrhosis (HCC)   Pleural effusion   Other ascites   Epistaxis: In the setting of thrombocytopenia. Stable  S/p transfusion of one unit of platelet on 10/10 and one unit of PRBC on 10/11 One more unit of platelet will be transfused today    Hematuria: In the setting of thrombocytopenia and h/o nephrolithiasis CT abdomen didn't show any obstruction/hydronephrosis Spoke to Dr Mitchell and she will see the patient today.   Alcoholic cirrhosis complicated by abdominopelvic ascites, portal hypertension and esophageal varices,POA: GI consulted Continue with lasix 40 mg BID daily and aldactone  100 mg daily Holding coreg F/u AFP levels Continue with lactulose  Child pugh class C (Life expectancy 1-3 years) MELD Na 22 indicating 3 month mortality of 20%   Thrombocytopenia: PLT of 23, s/p one unit of platelet transfusion on 10/10. One more unit of platelet will be given today. Ordered CBC to be checked in am S/p esophageal banding done earlier this year Continues to drink May need IR consult if ascites is significant but thrombocytopenia needs to be addressed before that.  Acute blood loss anemia: Hb  7.1 today after one unit of PRBC was transfused yesterday. In the setting of hematuria and slight epistaxis F/u H&H closely    Right sided pleural effusion,POA: -Likely hepatic hydrothorax -Appreciate pulm consult. Thoracentesis done today by Dr Zaida and  removal of 2 L of yellow fluid was done without any complications.     Alcohol abuse,POA: Continue with thiamine  and folate Alcohol cessation counseling TOC consult  Placed on CIWA protocol   Disposition: Home    DVT prophylaxis: SCDs Start: 11/11/23 1713     Code Status: Full Code Family Communication:  None at the bedside Status is: Inpt  Subjective:  He continues to have intermittent epistaxis and hematuria. We spoke about transfusing him one unit of platelets today. He is on 3 L nasal cannula. Shortness of breath has slightly improved. Urinating well but it's still blood stained. We spoke about possibly starting him on CBI to prevent clot formation and subsequent urethral obstruction.  Examination:  General exam: Appears calm and comfortable  Respiratory system: Diminished sounds over right hemithorax Cardiovascular system: S1 & S2 heard, RRR. No JVD, murmurs, rubs, gallops or clicks. No pedal edema. Gastrointestinal system: Slightly distended with positive fluid shift and thrill Central nervous system: Alert and oriented. No focal neurological deficits. Extremities: Symmetric 5 x 5 power. Skin: No rashes, lesions or ulcers Psychiatry: Judgement and insight appear normal. Mood & affect appropriate.       Diet Orders (From admission, onward)     Start     Ordered   11/12/23 1029  Diet 2 gram sodium Room service appropriate? Yes; Fluid consistency: Thin  Diet effective now       Question Answer Comment  Room service appropriate? Yes   Fluid consistency: Thin      11/12/23 1032            Objective: Vitals:   11/13/23 0013 11/13/23 0452 11/13/23  0517 11/13/23 0745  BP: 124/75 123/84 119/80 139/81  Pulse: 91 96 86 96  Resp: 20 16  20   Temp: 99 F (37.2 C) 99.3 F (37.4 C)  98.7 F (37.1 C)  TempSrc:  Oral  Oral  SpO2: 96% 97%  96%  Weight:      Height:        Intake/Output Summary (Last 24 hours) at 11/13/2023 0905 Last data filed at 11/13/2023  0500 Gross per 24 hour  Intake 500 ml  Output 700 ml  Net -200 ml   Filed Weights   11/12/23 0118  Weight: 83.5 kg    Scheduled Meds:  sodium chloride    Intravenous Once   sodium chloride    Intravenous Once   folic acid   1 mg Oral Daily   furosemide  40 mg Oral BID   lactulose   20 g Oral Daily   LORazepam   0-4 mg Oral Q6H   Followed by   LORazepam   0-4 mg Oral Q12H   multivitamin with minerals  1 tablet Oral Daily   pantoprazole   40 mg Oral Q0600   spironolactone   100 mg Oral Daily   thiamine   100 mg Oral Daily   Continuous Infusions:  cefTRIAXone  (ROCEPHIN )  IV 2 g (11/12/23 2143)    Nutritional status     Body mass index is 26.4 kg/m.  Data Reviewed:   CBC: Recent Labs  Lab 11/11/23 1305 11/12/23 0613 11/12/23 1437 11/13/23 0703  WBC 5.5 3.5*  --  2.9*  NEUTROABS 4.4  --   --  1.6*  HGB 7.9* 6.5* 7.3* 7.1*  HCT 26.1* 21.6* 23.4* 23.3*  MCV 86.1 85.7  --  86.3  PLT 23* 23*  --  23*   Basic Metabolic Panel: Recent Labs  Lab 11/11/23 1305 11/12/23 0613 11/13/23 0703  NA 132* 133* 135  K 3.5 4.3 3.6  CL 99 99 97*  CO2 23 25 24   GLUCOSE 121* 102* 109*  BUN 11 12 8   CREATININE 0.78 0.87 0.67  CALCIUM  7.6* 7.5* 7.2*   GFR: Estimated Creatinine Clearance: 124.2 mL/min (by C-G formula based on SCr of 0.67 mg/dL). Liver Function Tests: Recent Labs  Lab 11/11/23 1305 11/12/23 0613 11/13/23 0703  AST 76* 58* 50*  ALT 27 22 21   ALKPHOS 84 73 91  BILITOT 5.0* 3.1* 2.5*  PROT 7.3 6.3* 6.1*  ALBUMIN  2.3* 1.9* 1.8*   Recent Labs  Lab 11/11/23 1305  LIPASE 31   No results for input(s): AMMONIA in the last 168 hours. Coagulation Profile: Recent Labs  Lab 11/11/23 1305  INR 1.8*   Cardiac Enzymes: No results for input(s): CKTOTAL, CKMB, CKMBINDEX, TROPONINI in the last 168 hours. BNP (last 3 results) No results for input(s): PROBNP in the last 8760 hours. HbA1C: No results for input(s): HGBA1C in the last 72  hours. CBG: No results for input(s): GLUCAP in the last 168 hours. Lipid Profile: No results for input(s): CHOL, HDL, LDLCALC, TRIG, CHOLHDL, LDLDIRECT in the last 72 hours. Thyroid  Function Tests: No results for input(s): TSH, T4TOTAL, FREET4, T3FREE, THYROIDAB in the last 72 hours. Anemia Panel: Recent Labs    11/12/23 0613  VITAMINB12 699  FOLATE 15.4  TIBC 304  IRON 17*   Sepsis Labs: Recent Labs  Lab 11/11/23 1318 11/11/23 1619  LATICACIDVEN 2.6* 2.0*    No results found for this or any previous visit (from the past 240 hours).       Radiology Studies: CT CHEST ABDOMEN PELVIS  W CONTRAST Result Date: 11/11/2023 EXAM: CT CHEST, ABDOMEN AND PELVIS WITH CONTRAST 11/11/2023 04:49:15 PM TECHNIQUE: CT of the chest, abdomen and pelvis was performed with the administration of 75 mL of iohexol  (OMNIPAQUE ) 350 MG/ML injection. Multiplanar reformatted images are provided for review. Automated exposure control, iterative reconstruction, and/or weight based adjustment of the mA/kV was utilized to reduce the radiation dose to as low as reasonably achievable. COMPARISON: 04/22/2023 CLINICAL HISTORY: RLQ abdominal pain. FINDINGS: CHEST: MEDIASTINUM AND LYMPH NODES: Heart and pericardium are unremarkable. Mild scattered coronary calcifications. There is mild leftward shift of the mediastinum. The central airways are clear. No mediastinal, hilar or axillary lymphadenopathy. LUNGS AND PLEURA: Large right pleural effusion with significant atelectasis in the right lung, new since previous. Left lung remains clear. No pneumothorax. ABDOMEN AND PELVIS: LIVER: Mildly nodular hepatic contour without focal lesion. Portal vein patent. Small to moderate volume abdominal ascites mostly in the pelvis and perihepatic. GALLBLADDER AND BILE DUCTS: Gallbladder is physiologically distended. No biliary ductal dilatation. SPLEEN: Splenomegaly without focal lesion. Reconstituted falciform  ligament venous collateral channels. PANCREAS: No acute abnormality. ADRENAL GLANDS: No acute abnormality. KIDNEYS, URETERS AND BLADDER: Bilateral nephrolithiasis, largest 1 cm right lower pole. No ureteral calculus or significant hydronephrosis. No perinephric or periureteral stranding. Urinary bladder is unremarkable. GI AND BOWEL: A few gas and fluid distended right upper quadrant small bowel loops. The stomach and distal small bowel remain decompressed. Colon is nondistended. There is no bowel obstruction. REPRODUCTIVE ORGANS: Mild prostate enlargement with central coarse calcifications. PERITONEUM AND RETROPERITONEUM: Small to moderate volume abdominal ascites mostly in the pelvis and perihepatic. No free air. VASCULATURE: Aorta is normal in caliber. ABDOMINAL AND PELVIS LYMPH NODES: No lymphadenopathy. 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 No acute osseous abnormality otherwise. No focal soft tissue abnormality. IMPRESSION: 1. Large right pleural effusion or hepatic hydrothorax causing significant right lung atelectasis and mild leftward mediastinal shift. 2. Small to moderate abdominal ascites. 3. Hepatic cirrhosis with stigmata of portal venous hypertension 4. Bilateral nephrolithiasis, largest 1 cm in the right lower pole, without ureteral calculus or significant hydronephrosis. 5. 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 Electronically signed by: Dayne Hassell MD 11/11/2023 05:04 PM EDT RP Workstation: [id number]   DG Chest 2 View Result Date: 11/11/2023 EXAM: 2 VIEW(S) XRAY OF THE CHEST 11/11/2023 02:32:00 PM COMPARISON: 03/02/2018 CLINICAL HISTORY: SHOB. FINDINGS: LUNGS AND PLEURA: Large and possibly loculated right pleural effusion is noted. No focal pulmonary opacity. No pulmonary edema. No pneumothorax. HEART AND MEDIASTINUM: No acute abnormality of the cardiac and mediastinal silhouettes. BONES AND SOFT TISSUES: No  acute osseous abnormality. IMPRESSION: 1. Large, possibly loculated right pleural effusion. Electronically signed by: Lynwood Seip MD 11/11/2023 02:52 PM EDT RP Workstation: [id number]           LOS: 1 day   Time spent= 39 mins    Deliliah Room, MD [facility]  If 7PM-7AM, please contact night-coverage  11/13/2023, 9:05 AM

## 2023-11-13 NOTE — Consult Note (Signed)
 NAME:  Robert Sellers, MRN:  [id number], DOB:  Aug 23, 1980, LOS: 1 ADMISSION DATE:  11/11/2023, CONSULTATION DATE:  11/13/23 REFERRING MD:  Dr Deliliah, CHIEF COMPLAINT:  R pleural effusion   History of Present Illness:  43 year old male with a past medical history of alcoholic liver cirrhosis complicated by portal hypertension, esophageal varices and portal gastropathy presenting to the hospital with epistaxis, shortness of breath and abdominal distention due to decompensated liver cirrhosis.   Here in the hospital he is noted to have low-grade fevers with Tmax 101.  He is also on 2 L oxygen although no hypoxia episodes in his chart. His laboratory workup is significant for mild hyponatremia, no AKI, slight elevation in AST, leukopenia, anemia and thrombocytopenia with a platelet count of 23.  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  He is also noted to have moderate volume abdominopelvic ascites and anasarca.  Pulmonary was consulted for management of his right pleural effusion.   At bedside, the patient is not in any respiratory distress.  He states that his breathing was worse yesterday but it slightly improved today.  He is currently on 2 L oxygen.  He states that he has never had a thoracentesis but has had paracentesis in the past.  He continues to drink alcohol but he is trying to cut down.   Pertinent  Medical History  As mentioned above  Significant Hospital Events: Including procedures, antibiotic start and stop dates in addition to other pertinent events     Interim History / Subjective:  No changes in symptoms today, still reports some shortness of breath but no distress  Objective    Blood pressure 114/81, pulse (!) 106, temperature 100 F (37.8 C), temperature source Oral, resp. rate 19, height 5' 10 (1.778 m), weight 83.5 kg, SpO2 99%.        Intake/Output Summary (Last 24  hours) at 11/13/2023 1047 Last data filed at 11/13/2023 0500 Gross per 24 hour  Intake 500 ml  Output 700 ml  Net -200 ml   Filed Weights   11/12/23 0118  Weight: 83.5 kg    Examination: General: Middle-aged male not in acute distress, unchanged HENT: Moist mucous membranes, unchanged Lungs: Minimal air entry on the right lung, clear entry on the left lung, unchanged Cardiovascular: RRR, normal S1, S2, unchanged Abdomen: Soft, positive bowel sounds, slightly distended, unchanged Extremities: No edema, unchanged Neuro: Motor and sensation grossly intact. Unchanged    Resolved problem list   Assessment and Plan  This is a 43 year old male with alcoholic liver cirrhosis presenting with epistaxis, ascites and right-sided pleural effusion likely in the setting of hepatic hydrothorax.  The only concern is he is spiking low-grade fevers.  Thoracentesis is still doable but with slight increase risk of bleeding given thrombocytopenia.  # Right-sided pleural effusion likely hepatic hydrothorax Patient was getting platelets today and still complaining of shortness of breath, thora indicated for diagnostic and therapeutic purposes.  Underwent thoracentesis today and 2 L of fluid were removed.  Will order postthoracentesis chest x-ray and follow-up with fluid analysis.  PCCM will continue to follow.  Please reach out if he develops any respiratory distress.    Labs   CBC: Recent Labs  Lab 11/11/23 1305 11/12/23 0613 11/12/23 1437 11/13/23 0703  WBC 5.5 3.5*  --  2.9*  NEUTROABS 4.4  --   --  1.6*  HGB 7.9* 6.5* 7.3* 7.1*  HCT 26.1*  21.6* 23.4* 23.3*  MCV 86.1 85.7  --  86.3  PLT 23* 23*  --  23*    Basic Metabolic Panel: Recent Labs  Lab 11/11/23 1305 11/12/23 0613 11/13/23 0703  NA 132* 133* 135  K 3.5 4.3 3.6  CL 99 99 97*  CO2 23 25 24   GLUCOSE 121* 102* 109*  BUN 11 12 8   CREATININE 0.78 0.87 0.67  CALCIUM  7.6* 7.5* 7.2*   GFR: Estimated Creatinine Clearance:  124.2 mL/min (by C-G formula based on SCr of 0.67 mg/dL). Recent Labs  Lab 11/11/23 1305 11/11/23 1318 11/11/23 1619 11/12/23 0613 11/13/23 0703  WBC 5.5  --   --  3.5* 2.9*  LATICACIDVEN  --  2.6* 2.0*  --   --     Liver Function Tests: Recent Labs  Lab 11/11/23 1305 11/12/23 0613 11/13/23 0703  AST 76* 58* 50*  ALT 27 22 21   ALKPHOS 84 73 91  BILITOT 5.0* 3.1* 2.5*  PROT 7.3 6.3* 6.1*  ALBUMIN  2.3* 1.9* 1.8*   Recent Labs  Lab 11/11/23 1305  LIPASE 31   No results for input(s): AMMONIA in the last 168 hours.  ABG No results found for: PHART, PCO2ART, PO2ART, HCO3, TCO2, ACIDBASEDEF, O2SAT   Coagulation Profile: Recent Labs  Lab 11/11/23 1305  INR 1.8*    Cardiac Enzymes: No results for input(s): CKTOTAL, CKMB, CKMBINDEX, TROPONINI in the last 168 hours.  HbA1C: No results found for: HGBA1C  CBG: No results for input(s): GLUCAP in the last 168 hours.  Review of Systems:   As above  Past Medical History:  He,  has a past medical history of Acute blood loss anemia (10/01/2022), Acute upper GI bleeding (09/29/2022), Anxiety, Ascites, Clotting disorder, Depression, Hyperlipidemia (12/04/2012), Hypertension, Left shoulder pain (08/28/2015), and Substance abuse (HCC).   Surgical History:   Past Surgical History:  Procedure Laterality Date   ESOPHAGOGASTRODUODENOSCOPY N/A 04/12/2023   Procedure: EGD (ESOPHAGOGASTRODUODENOSCOPY);  Surgeon: Jinny Carmine, MD;  Location[facility] ENDOSCOPY;  Service: Endoscopy;  Laterality: N/A;   ESOPHAGOGASTRODUODENOSCOPY N/A 05/13/2023   Procedure: EGD (ESOPHAGOGASTRODUODENOSCOPY);  Surgeon: Onita Elspeth Sharper, DO;  Location: [facility] ENDOSCOPY;  Service: Gastroenterology;  Laterality: N/A;   ESOPHAGOGASTRODUODENOSCOPY N/A 06/03/2023   Procedure: EGD (ESOPHAGOGASTRODUODENOSCOPY);  Surgeon: Onita Elspeth Sharper, DO;  Location: [facility] ENDOSCOPY;  Service: Gastroenterology;  Laterality: N/A;    ESOPHAGOGASTRODUODENOSCOPY (EGD) WITH PROPOFOL  N/A 09/30/2022   Procedure: ESOPHAGOGASTRODUODENOSCOPY (EGD) WITH PROPOFOL ;  Surgeon: Toledo, Ladell POUR, MD;  Location: [facility] ENDOSCOPY;  Service: Gastroenterology;  Laterality: N/A;   GASTRIC VARICES BANDING  04/12/2023   Procedure: BAND LIGATION, GASTRIC VARICES;  Surgeon: Jinny Carmine, MD;  Location: [facility] ENDOSCOPY;  Service: Endoscopy;;   KNEE SURGERY     left shoulder surgery Left 2024   [facility]     Social History:   reports that he has never smoked. He has never used smokeless tobacco. He reports that he does not currently use alcohol after a past usage of about 1.0 standard drink of alcohol per week. He reports that he does not use drugs.   Family History:  His family history includes Alcohol abuse in his father; Diabetes in his father and mother; Early death in his father and mother; Heart disease in his father and mother; Hyperlipidemia in his father and mother; Hypertension in his father and mother; Kidney disease in his father; Obesity in his father and mother.   Allergies No Known Allergies   Home Medications  Prior to Admission medications   Medication Sig  Start Date End Date Taking? Authorizing Provider  sertraline  (ZOLOFT ) 50 MG tablet TAKE 1 TABLET(50 MG) BY MOUTH DAILY Patient not taking: Reported on 11/11/2023 06/02/23   Vincente Shivers, NP

## 2023-11-13 NOTE — Procedures (Signed)
 Thoracentesis  Procedure Note  OMAR GAYDEN  [id number]  1981/01/02  Date:11/13/23  Time:10:46 AM   Provider Performing:Jeani Fassnacht N Jahseh Lucchese   Procedure: Thoracentesis with imaging guidance (67444)    Indication(s) Pleural Effusion  Consent Risks of the procedure as well as the alternatives and risks of each were explained to the patient and/or caregiver.  Consent for the procedure was obtained and is signed in the bedside chart  Anesthesia Topical only with 1% lidocaine     Time Out Verified patient identification, verified procedure, site/side was marked, verified correct patient position, special equipment/implants available, medications/allergies/relevant history reviewed, required imaging and test results available.   Sterile Technique Maximal sterile technique including full sterile barrier drape, hand hygiene, sterile gown, sterile gloves, mask, hair covering, sterile ultrasound probe cover (if used).  Procedure Description Ultrasound was used to identify appropriate pleural anatomy for placement and overlying skin marked.  Area of drainage cleaned and draped in sterile fashion. Lidocaine  was used to anesthetize the skin and subcutaneous tissue.  2000 cc's of clear yellow appearing fluid was drained from the right pleural space. Catheter then removed and bandaid applied to site.   Complications/Tolerance None; patient tolerated the procedure well. Chest X-ray is ordered to confirm no post-procedural complication.   EBL Minimal   Specimen(s) Pleural fluid

## 2023-11-13 NOTE — Plan of Care (Signed)

## 2023-11-14 ENCOUNTER — Other Ambulatory Visit (HOSPITAL_COMMUNITY): Payer: Self-pay

## 2023-11-14 ENCOUNTER — Inpatient Hospital Stay (HOSPITAL_COMMUNITY): Payer: Self-pay

## 2023-11-14 LAB — CBC WITH DIFFERENTIAL/PLATELET
Abs Immature Granulocytes: 0.01 K/uL (ref 0.00–0.07)
Basophils Absolute: 0 K/uL (ref 0.0–0.1)
Basophils Relative: 1 %
Eosinophils Absolute: 0.1 K/uL (ref 0.0–0.5)
Eosinophils Relative: 3 %
HCT: 23.3 % — ABNORMAL LOW (ref 39.0–52.0)
Hemoglobin: 7.1 g/dL — ABNORMAL LOW (ref 13.0–17.0)
Immature Granulocytes: 1 %
Lymphocytes Relative: 35 %
Lymphs Abs: 0.8 K/uL (ref 0.7–4.0)
MCH: 26.2 pg (ref 26.0–34.0)
MCHC: 30.5 g/dL (ref 30.0–36.0)
MCV: 86 fL (ref 80.0–100.0)
Monocytes Absolute: 0.3 K/uL (ref 0.1–1.0)
Monocytes Relative: 13 %
Neutro Abs: 1 K/uL — ABNORMAL LOW (ref 1.7–7.7)
Neutrophils Relative %: 47 %
Platelets: 29 K/uL — CL (ref 150–400)
RBC: 2.71 MIL/uL — ABNORMAL LOW (ref 4.22–5.81)
RDW: 17.9 % — ABNORMAL HIGH (ref 11.5–15.5)
WBC: 2.1 K/uL — ABNORMAL LOW (ref 4.0–10.5)
nRBC: 0 % (ref 0.0–0.2)

## 2023-11-14 LAB — PREPARE PLATELET PHERESIS: Unit division: 0

## 2023-11-14 LAB — BPAM PLATELET PHERESIS
Blood Product Expiration Date: 202510142359
ISSUE DATE / TIME: 202510121002
Unit Type and Rh: 6200

## 2023-11-14 LAB — HEPATIC FUNCTION PANEL
ALT: 18 U/L (ref 0–44)
AST: 48 U/L — ABNORMAL HIGH (ref 15–41)
Albumin: 1.8 g/dL — ABNORMAL LOW (ref 3.5–5.0)
Alkaline Phosphatase: 93 U/L (ref 38–126)
Bilirubin, Direct: 0.9 mg/dL — ABNORMAL HIGH (ref 0.0–0.2)
Indirect Bilirubin: 1.6 mg/dL — ABNORMAL HIGH (ref 0.3–0.9)
Total Bilirubin: 2.5 mg/dL — ABNORMAL HIGH (ref 0.0–1.2)
Total Protein: 6.3 g/dL — ABNORMAL LOW (ref 6.5–8.1)

## 2023-11-14 MED ORDER — LIDOCAINE HCL 1 % IJ SOLN
INTRAMUSCULAR | Status: AC
  Filled 2023-11-14: qty 20

## 2023-11-14 MED ORDER — NALTREXONE HCL 50 MG PO TABS
50.0000 mg | ORAL_TABLET | Freq: Every day | ORAL | Status: DC
  Administered 2023-11-14 – 2023-11-16 (×3): 50 mg via ORAL
  Filled 2023-11-14 (×3): qty 1

## 2023-11-14 NOTE — Progress Notes (Signed)
 PROGRESS NOTE    Robert Sellers  FMW:[id number] DOB: Jun 14, 1980 DOA: 11/11/2023 PCP: Vincente Shivers, NP   Brief Narrative:   43 y.o. male with medical history significant of multiple comorbidities including alcoholic cirrhosis, complicated by ascites, portal hypertension and esophageal varices, nephrolithiasis, who presented to the ED with chief complaint of shortness of breath, abdominal pain, nasal bleed as well as blood in urine. GI consulted.  Assessment & Plan:  Principal Problem:   Epistaxis Active Problems:   Decompensated cirrhosis (HCC)   Pleural effusion   Other ascites   Epistaxis: In the setting of thrombocytopenia. Stable  S/p transfusion of one unit of platelet on 10/10 and 10/12 and one unit of PRBC on 10/11     Hematuria: In the setting of thrombocytopenia and h/o nephrolithiasis CT abdomen didn't show any obstruction/hydronephrosis Spoke to Dr Mitchell and no urological intervention needed inpatient.  Patient will follow-up outpatient with urology for nephrolithiasis.   Alcoholic cirrhosis complicated by abdominopelvic ascites, portal hypertension and esophageal varices,POA: GI consulted Continue with lasix 40 mg BID daily and aldactone  100 mg daily Holding coreg F/u AFP levels-2.1 Continue with lactulose  Child pugh class C (Life expectancy 1-3 years) MELD Na 22 indicating 3 month mortality of 20% IR consulted on 10/13 for paracentesis   Thrombocytopenia: PLT of 23, s/p one unit of platelet transfusion on 10/10. One more unit of platelet was given on 10/12. Ordered CBC to be checked daily S/p esophageal banding done earlier this year Continues to drink   Acute blood loss anemia: Hb  7.1 . S/p one unit of PRBC In the setting of hematuria and slight epistaxis F/u H&H closely    Right sided pleural effusion,POA: -Likely hepatic hydrothorax -Appreciate pulm consult. Thoracentesis done ton 10/12 by Dr Zaida and removal of 2 L of yellow fluid was done  without any complications.     Alcohol abuse,POA: Continue with thiamine  and folate Alcohol cessation counseling TOC consult  Placed on CIWA protocol Started on naltrexone. Patient was on it as an outpatient until 6 months back.   Disposition: Home    DVT prophylaxis: SCDs Start: 11/11/23 1713     Code Status: Full Code Family Communication:  None at the bedside Status is: Inpt  Subjective:  Feels better today.  He said that he felt better in terms of his shortness of breath later after the thoracentesis.  He is asking if we can do a paracentesis today and I told him that I will consult IR for that.  Hematuria has been slowly resolving and I informed him that I spoke to the urologist yesterday and plan is for outpatient follow-up for nephrolithiasis.  He is on 3 L oxygen.  He also spoke about getting any resources to help with the financial aid as he has lost insurance and I told him that I will talk to the case management about that.  I  Examination:  General exam: Appears calm and comfortable  Respiratory system: Improved breath sounds over the right hemithorax. Cardiovascular system: S1 & S2 heard, RRR. No JVD, murmurs, rubs, gallops or clicks. No pedal edema. Gastrointestinal system: Slightly distended with positive fluid shift and thrill Central nervous system: Alert and oriented. No focal neurological deficits. Extremities: Symmetric 5 x 5 power. Skin: No rashes, lesions or ulcers Psychiatry: Judgement and insight appear normal. Mood & affect appropriate.       Diet Orders (From admission, onward)     Start     Ordered   11/12/23 1029  Diet 2 gram sodium Room service appropriate? Yes; Fluid consistency: Thin  Diet effective now       Question Answer Comment  Room service appropriate? Yes   Fluid consistency: Thin      11/12/23 1032            Objective: Vitals:   11/13/23 1937 11/14/23 0017 11/14/23 0436 11/14/23 0744  BP: (!) 133/99 107/71 107/62  123/79  Pulse: 94 98 85 94  Resp: 18 16 16    Temp: 98.3 F (36.8 C) 99 F (37.2 C) 98.7 F (37.1 C)   TempSrc:      SpO2: 100% 94% 96% 95%  Weight:      Height:        Intake/Output Summary (Last 24 hours) at 11/14/2023 0912 Last data filed at 11/14/2023 0744 Gross per 24 hour  Intake 600 ml  Output 3200 ml  Net -2600 ml   Filed Weights   11/12/23 0118  Weight: 83.5 kg    Scheduled Meds:  sodium chloride    Intravenous Once   folic acid   1 mg Oral Daily   furosemide  40 mg Oral BID   lactulose   20 g Oral Daily   LORazepam   0-4 mg Oral Q12H   multivitamin with minerals  1 tablet Oral Daily   naltrexone  50 mg Oral Daily   pantoprazole   40 mg Oral Q0600   spironolactone   100 mg Oral Daily   thiamine   100 mg Oral Daily   Continuous Infusions:  cefTRIAXone  (ROCEPHIN )  IV 2 g (11/13/23 2121)    Nutritional status     Body mass index is 26.4 kg/m.  Data Reviewed:   CBC: Recent Labs  Lab 11/11/23 1305 11/12/23 0613 11/12/23 1437 11/13/23 0703 11/14/23 0445  WBC 5.5 3.5*  --  2.9* 2.1*  NEUTROABS 4.4  --   --  1.6* 1.0*  HGB 7.9* 6.5* 7.3* 7.1* 7.1*  HCT 26.1* 21.6* 23.4* 23.3* 23.3*  MCV 86.1 85.7  --  86.3 86.0  PLT 23* 23*  --  23* 29*   Basic Metabolic Panel: Recent Labs  Lab 11/11/23 1305 11/12/23 0613 11/13/23 0703  NA 132* 133* 135  K 3.5 4.3 3.6  CL 99 99 97*  CO2 23 25 24   GLUCOSE 121* 102* 109*  BUN 11 12 8   CREATININE 0.78 0.87 0.67  CALCIUM  7.6* 7.5* 7.2*   GFR: Estimated Creatinine Clearance: 124.2 mL/min (by C-G formula based on SCr of 0.67 mg/dL). Liver Function Tests: Recent Labs  Lab 11/11/23 1305 11/12/23 0613 11/13/23 0703 11/14/23 0445  AST 76* 58* 50* 48*  ALT 27 22 21 18   ALKPHOS 84 73 91 93  BILITOT 5.0* 3.1* 2.5* 2.5*  PROT 7.3 6.3* 6.1* 6.3*  ALBUMIN  2.3* 1.9* 1.8* 1.8*   Recent Labs  Lab 11/11/23 1305  LIPASE 31   No results for input(s): AMMONIA in the last 168 hours. Coagulation Profile: Recent  Labs  Lab 11/11/23 1305  INR 1.8*   Cardiac Enzymes: No results for input(s): CKTOTAL, CKMB, CKMBINDEX, TROPONINI in the last 168 hours. BNP (last 3 results) No results for input(s): PROBNP in the last 8760 hours. HbA1C: No results for input(s): HGBA1C in the last 72 hours. CBG: No results for input(s): GLUCAP in the last 168 hours. Lipid Profile: No results for input(s): CHOL, HDL, LDLCALC, TRIG, CHOLHDL, LDLDIRECT in the last 72 hours. Thyroid  Function Tests: No results for input(s): TSH, T4TOTAL, FREET4, T3FREE, THYROIDAB in the last 72 hours.  Anemia Panel: Recent Labs    11/12/23 0613  VITAMINB12 699  FOLATE 15.4  TIBC 304  IRON 17*   Sepsis Labs: Recent Labs  Lab 11/11/23 1318 11/11/23 1619  LATICACIDVEN 2.6* 2.0*    Recent Results (from the past 240 hours)  Body fluid culture w Gram Stain     Status: None (Preliminary result)   Collection Time: 11/13/23 10:07 AM   Specimen: Pleural Fluid  Result Value Ref Range Status   Specimen Description PLEURAL  Final   Special Requests PLEURAL,RIGHT  Final   Gram Stain   Final    NO WBC SEEN NO ORGANISMS SEEN Performed at [facility], [address], [place], [place] [address]    Culture PENDING  Incomplete   Report Status PENDING  Incomplete         Radiology Studies: DG CHEST PORT 1 VIEW Result Date: 11/13/2023 EXAM: 1 VIEW(S) XRAY OF THE CHEST 11/13/2023 11:16:00 AM COMPARISON: 11/11/2023 CLINICAL HISTORY: Pleural effusion FINDINGS: LUNGS AND PLEURA: Low lung volumes. Large right pleural effusion with right lung atelectasis and/or consolidation. Rounded pleural-based opacity overlying right upper lung, possibly representing loculated pleural fluid. No pneumothorax. HEART AND MEDIASTINUM: No acute abnormality of the cardiac and mediastinal silhouettes. BONES AND SOFT TISSUES: No acute osseous abnormality. IMPRESSION: 1. Large right pleural effusion with right lung  atelectasis and/or consolidation; loculated pleural fluid suspected. 2. Low lung volumes. Electronically signed by: Waddell Calk MD 11/13/2023 12:10 PM EDT RP Workstation: [id number]           LOS: 2 days   Time spent= 39 mins    Deliliah Room, MD [facility]  If 7PM-7AM, please contact night-coverage  11/14/2023, 9:12 AM

## 2023-11-14 NOTE — Plan of Care (Signed)

## 2023-11-14 NOTE — Progress Notes (Signed)
 I was present during limited ultrasound of the abdomen prior to possible paracentesis.   There was no significant pocket of fluid in all 4 abdominal quadrants to allow for safe approach for paracentesis.   Romir Klimowicz B Evadne Ose NP 11/14/2023 12:15 PM

## 2023-11-14 NOTE — Discharge Instructions (Addendum)
 Bring this coupon to [facility] for a discounted price on naltrexone.

## 2023-11-15 ENCOUNTER — Inpatient Hospital Stay (HOSPITAL_COMMUNITY): Payer: Self-pay

## 2023-11-15 LAB — CBC WITH DIFFERENTIAL/PLATELET
Abs Immature Granulocytes: 0.01 K/uL (ref 0.00–0.07)
Basophils Absolute: 0 K/uL (ref 0.0–0.1)
Basophils Relative: 0 %
Eosinophils Absolute: 0.1 K/uL (ref 0.0–0.5)
Eosinophils Relative: 2 %
HCT: 22.8 % — ABNORMAL LOW (ref 39.0–52.0)
Hemoglobin: 7.1 g/dL — ABNORMAL LOW (ref 13.0–17.0)
Immature Granulocytes: 0 %
Lymphocytes Relative: 28 %
Lymphs Abs: 0.7 K/uL (ref 0.7–4.0)
MCH: 26.5 pg (ref 26.0–34.0)
MCHC: 31.1 g/dL (ref 30.0–36.0)
MCV: 85.1 fL (ref 80.0–100.0)
Monocytes Absolute: 0.3 K/uL (ref 0.1–1.0)
Monocytes Relative: 14 %
Neutro Abs: 1.4 K/uL — ABNORMAL LOW (ref 1.7–7.7)
Neutrophils Relative %: 56 %
Platelets: 34 K/uL — ABNORMAL LOW (ref 150–400)
RBC: 2.68 MIL/uL — ABNORMAL LOW (ref 4.22–5.81)
RDW: 18.5 % — ABNORMAL HIGH (ref 11.5–15.5)
WBC: 2.5 K/uL — ABNORMAL LOW (ref 4.0–10.5)
nRBC: 0 % (ref 0.0–0.2)

## 2023-11-15 LAB — BASIC METABOLIC PANEL WITH GFR
Anion gap: 11 (ref 5–15)
BUN: <5 mg/dL — ABNORMAL LOW (ref 6–20)
CO2: 26 mmol/L (ref 22–32)
Calcium: 7.5 mg/dL — ABNORMAL LOW (ref 8.9–10.3)
Chloride: 95 mmol/L — ABNORMAL LOW (ref 98–111)
Creatinine, Ser: 0.86 mg/dL (ref 0.61–1.24)
GFR, Estimated: >60 mL/min (ref >60)
Glucose, Bld: 126 mg/dL — ABNORMAL HIGH (ref 70–99)
Potassium: 3.1 mmol/L — ABNORMAL LOW (ref 3.5–5.1)
Sodium: 132 mmol/L — ABNORMAL LOW (ref 135–145)

## 2023-11-15 LAB — CYTOLOGY - NON PAP

## 2023-11-15 MED ORDER — SODIUM CHLORIDE 0.9 % IV SOLN
500.0000 mg | Freq: Once | INTRAVENOUS | Status: AC
  Administered 2023-11-15: 500 mg via INTRAVENOUS
  Filled 2023-11-15: qty 25

## 2023-11-15 MED ORDER — POTASSIUM CHLORIDE CRYS ER 20 MEQ PO TBCR
40.0000 meq | EXTENDED_RELEASE_TABLET | ORAL | Status: AC
  Administered 2023-11-15 (×2): 40 meq via ORAL
  Filled 2023-11-15 (×2): qty 2

## 2023-11-15 MED ORDER — DM-GUAIFENESIN ER 30-600 MG PO TB12
1.0000 | ORAL_TABLET | Freq: Two times a day (BID) | ORAL | Status: DC
  Administered 2023-11-15 – 2023-11-16 (×3): 1 via ORAL
  Filled 2023-11-15 (×3): qty 1

## 2023-11-15 MED ORDER — IRON SUCROSE 500 MG IVPB - SIMPLE MED
500.0000 mg | Freq: Once | INTRAVENOUS | Status: DC
  Filled 2023-11-15: qty 275

## 2023-11-15 NOTE — Progress Notes (Signed)
 PROGRESS NOTE    Robert Sellers  FMW:[id number] DOB: [birth year] DOA: 11/11/2023 PCP: Vincente Shivers, NP   Brief Narrative:   43 y.o. male with medical history significant of multiple comorbidities including alcoholic cirrhosis, complicated by ascites, portal hypertension and esophageal varices, nephrolithiasis, who presented to the ED with chief complaint of shortness of breath, abdominal pain, nasal bleed as well as blood in urine. GI consulted. S/p right sided thoracentesis on 10/12. Unsuccessful attempt at paracentesis by IR (not enough fluid to drain)  Assessment & Plan:  Principal Problem:   Epistaxis Active Problems:   Decompensated cirrhosis (HCC)   Pleural effusion   Other ascites   Epistaxis: In the setting of thrombocytopenia. Resolved S/p transfusion of one unit of platelet on 10/10 and 10/12 and one unit of PRBC on 10/11   Hematuria: In the setting of thrombocytopenia and h/o nephrolithiasis. Resolved now. CT abdomen didn't show any obstruction/hydronephrosis Spoke to Dr Mitchell and no urological intervention needed inpatient.  Patient will follow-up outpatient with urology for nephrolithiasis.   Alcoholic cirrhosis complicated by abdominopelvic ascites, portal hypertension and esophageal varices,POA: GI consulted Continue with lasix 40 mg BID daily and aldactone  100 mg daily Holding coreg F/u AFP levels-2.1 Continue with lactulose  Child pugh class C (Life expectancy 1-3 years) MELD Na 22 indicating 3 month mortality of 20% IR consulted on 10/13 for paracentesis but not enough fluid to drain.   Thrombocytopenia: PLT of 23, s/p one unit of platelet transfusion on 10/10. One more unit of platelet was given on 10/12. Ordered CBC to be checked daily S/p esophageal banding done earlier this year   Acute blood loss anemia: Hb  7.1 . S/p one unit of PRBC In the setting of hematuria and slight epistaxis F/u H&H closely IV iron will be given today.    Right sided  pleural effusion,POA: -Likely hepatic hydrothorax -Appreciate pulm consult. Thoracentesis done on 10/12 by Dr Zaida and removal of 2 L of yellow fluid was done without any complications.     Alcohol abuse,POA: Continue with thiamine  and folate Alcohol cessation counseling TOC consult  Placed on CIWA protocol Started on naltrexone. Patient was on it as an outpatient until 6 months back.   Disposition: Home    DVT prophylaxis: SCDs Start: 11/11/23 1713     Code Status: Full Code Family Communication:  None at the bedside Status is: Inpt  Subjective:  He is complaining of worsening cough this morning. I spoke about ordering antitussives. We spoke about need for IV iron followed by po. He was told that his medications will be sent to [facility] pharmacy on discharge.   Examination:  General exam: Appears calm and comfortable  Respiratory system: Diminished breath sounds over the right hemithorax. Cardiovascular system: S1 & S2 heard, RRR. No JVD, murmurs, rubs, gallops or clicks. No pedal edema. Gastrointestinal system: Slightly distended with positive fluid shift and thrill Central nervous system: Alert and oriented. No focal neurological deficits. Extremities: Symmetric 5 x 5 power. Skin: No rashes, lesions or ulcers Psychiatry: Judgement and insight appear normal. Mood & affect appropriate.       Diet Orders (From admission, onward)     Start     Ordered   11/12/23 1029  Diet 2 gram sodium Room service appropriate? Yes; Fluid consistency: Thin  Diet effective now       Question Answer Comment  Room service appropriate? Yes   Fluid consistency: Thin      11/12/23 1032  Objective: Vitals:   11/14/23 2155 11/15/23 0118 11/15/23 0500 11/15/23 0824  BP: 108/69 126/74 130/74 125/89  Pulse: 93 76 86 99  Resp: 20 18 18    Temp: 99 F (37.2 C) 98.2 F (36.8 C) 97.8 F (36.6 C) 98.1 F (36.7 C)  TempSrc:    Oral  SpO2: 98% 99% 98% 95%  Weight:       Height:        Intake/Output Summary (Last 24 hours) at 11/15/2023 1122 Last data filed at 11/15/2023 0601 Gross per 24 hour  Intake 200 ml  Output 1350 ml  Net -1150 ml   Filed Weights   11/12/23 0118  Weight: 83.5 kg    Scheduled Meds:  sodium chloride    Intravenous Once   dextromethorphan-guaiFENesin   1 tablet Oral BID   folic acid   1 mg Oral Daily   furosemide  40 mg Oral BID   lactulose   20 g Oral Daily   LORazepam   0-4 mg Oral Q12H   multivitamin with minerals  1 tablet Oral Daily   naltrexone  50 mg Oral Daily   pantoprazole   40 mg Oral Q0600   potassium chloride   40 mEq Oral Q4H   spironolactone   100 mg Oral Daily   thiamine   100 mg Oral Daily   Continuous Infusions:  cefTRIAXone  (ROCEPHIN )  IV 2 g (11/14/23 1952)   iron sucrose 500 mg (11/15/23 1016)    Nutritional status     Body mass index is 26.4 kg/m.  Data Reviewed:   CBC: Recent Labs  Lab 11/11/23 1305 11/12/23 9386 11/12/23 1437 11/13/23 0703 11/14/23 0445 11/15/23 0332  WBC 5.5 3.5*  --  2.9* 2.1* 2.5*  NEUTROABS 4.4  --   --  1.6* 1.0* 1.4*  HGB 7.9* 6.5* 7.3* 7.1* 7.1* 7.1*  HCT 26.1* 21.6* 23.4* 23.3* 23.3* 22.8*  MCV 86.1 85.7  --  86.3 86.0 85.1  PLT 23* 23*  --  23* 29* 34*   Basic Metabolic Panel: Recent Labs  Lab 11/11/23 1305 11/12/23 0613 11/13/23 0703 11/15/23 0332  NA 132* 133* 135 132*  K 3.5 4.3 3.6 3.1*  CL 99 99 97* 95*  CO2 23 25 24 26   GLUCOSE 121* 102* 109* 126*  BUN 11 12 8  <5*  CREATININE 0.78 0.87 0.67 0.86  CALCIUM  7.6* 7.5* 7.2* 7.5*   GFR: Estimated Creatinine Clearance: 115.5 mL/min (by C-G formula based on SCr of 0.86 mg/dL). Liver Function Tests: Recent Labs  Lab 11/11/23 1305 11/12/23 0613 11/13/23 0703 11/14/23 0445  AST 76* 58* 50* 48*  ALT 27 22 21 18   ALKPHOS 84 73 91 93  BILITOT 5.0* 3.1* 2.5* 2.5*  PROT 7.3 6.3* 6.1* 6.3*  ALBUMIN  2.3* 1.9* 1.8* 1.8*   Recent Labs  Lab 11/11/23 1305  LIPASE 31   No results for input(s):  AMMONIA in the last 168 hours. Coagulation Profile: Recent Labs  Lab 11/11/23 1305  INR 1.8*   Cardiac Enzymes: No results for input(s): CKTOTAL, CKMB, CKMBINDEX, TROPONINI in the last 168 hours. BNP (last 3 results) No results for input(s): PROBNP in the last 8760 hours. HbA1C: No results for input(s): HGBA1C in the last 72 hours. CBG: No results for input(s): GLUCAP in the last 168 hours. Lipid Profile: No results for input(s): CHOL, HDL, LDLCALC, TRIG, CHOLHDL, LDLDIRECT in the last 72 hours. Thyroid  Function Tests: No results for input(s): TSH, T4TOTAL, FREET4, T3FREE, THYROIDAB in the last 72 hours. Anemia Panel: No results for input(s): VITAMINB12,  FOLATE, FERRITIN, TIBC, IRON, RETICCTPCT in the last 72 hours.  Sepsis Labs: Recent Labs  Lab 11/11/23 1318 11/11/23 1619  LATICACIDVEN 2.6* 2.0*    Recent Results (from the past 240 hours)  Body fluid culture w Gram Stain     Status: None (Preliminary result)   Collection Time: 11/13/23 10:07 AM   Specimen: Pleural Fluid  Result Value Ref Range Status   Specimen Description PLEURAL  Final   Special Requests PLEURAL,RIGHT  Final   Gram Stain NO WBC SEEN NO ORGANISMS SEEN   Final   Culture   Final    NO GROWTH 2 DAYS Performed at [facility], [address], [place], [place] [address]    Report Status PENDING  Incomplete         Radiology Studies: IR ABDOMEN US  LIMITED Result Date: 11/14/2023 CLINICAL DATA:  43 year old male with medical history of alcoholic cirrhosis. Received request for paracentesis. EXAM: ULTRASOUND ABDOMEN LIMITED COMPARISON:  11/11/2023 CT Chest, Abdomen, and Pelvis FINDINGS: Small amount of ascites. No significant pocket of fluid to allow for safe approach for paracentesis. All four abdominal quadrants were examined. IMPRESSION: Small amount of ascites.  Paracentesis was not performed. Performed by: Kristi Davenport, NP Electronically  Signed   By: Juliene Balder M.D.   On: 11/14/2023 14:14           LOS: 3 days   Time spent= 39 mins    Deliliah Room, MD [facility]  If 7PM-7AM, please contact night-coverage  11/15/2023, 11:22 AM

## 2023-11-15 NOTE — Progress Notes (Addendum)
-----------------------------------------------  Patient assessed by the [facility]------------------------------------   Chart reviewed:Yes   Documentation gaps: NONE  Labs, test, and orders reviewed: TOC CONSULT  30-day Readmission: No  Discharge order: No EDD is listed for today.  Current discharge plan:  Short View Care Plan: Long View Care Plan:  Barrier to discharge before 11am: [facility] consult in on 11/11/2023 but no documentation by [facility] team as of current time. Pt. Needs SA counseling and resources due to recent loss of insurance.    Intervention provided by [facility] team: Secure Chat to [facility] team upon arrival this am.   Barrier resolved: Yes In process pending response from [facility] team. TOC to see patient today.   Brantley Naser, RN  [employer]

## 2023-11-15 NOTE — Progress Notes (Addendum)
 Transition of Care [facility]) - Inpatient Brief Assessment   Patient Details  Name: Robert Sellers MRN: [id number] Date of Birth: 1981-02-01  Transition of Care [facility] CM/SW Contact:    Rosaline JONELLE Joe, RN Phone Number: 11/15/2023, 10:05 AM   Clinical Narrative: Patient admitted for Epistaxis.  Patient with history of ETOH use and continues to drink alcohol.  OP counseling resources included in the AVS for the patient.  MD asked to send patient's discharge medications to [facility] pharmacy since patient has no insurance and has difficulty affording medications.  Resources for social services included in the AVS for assistance and follow up for Medicaid.  No other IP Care management needs at this time.   Transition of Care Asessment: Insurance and Status: (P) Insurance coverage has been reviewed Patient has primary care physician: (P) Yes Home environment has been reviewed: (P) from home Prior level of function:: (P) self Prior/Current Home Services: (P) No current home services Social Drivers of Health Review: (P) SDOH reviewed interventions complete Readmission risk has been reviewed: (P) Yes Transition of care needs: (P) transition of care needs identified, TOC will continue to follow

## 2023-11-16 DIAGNOSIS — K7290 Hepatic failure, unspecified without coma: Secondary | ICD-10-CM

## 2023-11-16 DIAGNOSIS — K7460 Unspecified cirrhosis of liver: Secondary | ICD-10-CM

## 2023-11-16 LAB — CBC WITH DIFFERENTIAL/PLATELET
Abs Immature Granulocytes: 0.02 K/uL (ref 0.00–0.07)
Basophils Absolute: 0 K/uL (ref 0.0–0.1)
Basophils Relative: 1 %
Eosinophils Absolute: 0 K/uL (ref 0.0–0.5)
Eosinophils Relative: 1 %
HCT: 22.7 % — ABNORMAL LOW (ref 39.0–52.0)
Hemoglobin: 7.1 g/dL — ABNORMAL LOW (ref 13.0–17.0)
Immature Granulocytes: 1 %
Lymphocytes Relative: 19 %
Lymphs Abs: 0.5 K/uL — ABNORMAL LOW (ref 0.7–4.0)
MCH: 26.9 pg (ref 26.0–34.0)
MCHC: 31.3 g/dL (ref 30.0–36.0)
MCV: 86 fL (ref 80.0–100.0)
Monocytes Absolute: 0.5 K/uL (ref 0.1–1.0)
Monocytes Relative: 17 %
Neutro Abs: 1.7 K/uL (ref 1.7–7.7)
Neutrophils Relative %: 61 %
Platelets: 38 K/uL — ABNORMAL LOW (ref 150–400)
RBC: 2.64 MIL/uL — ABNORMAL LOW (ref 4.22–5.81)
RDW: 18.7 % — ABNORMAL HIGH (ref 11.5–15.5)
WBC: 2.8 K/uL — ABNORMAL LOW (ref 4.0–10.5)
nRBC: 0 % (ref 0.0–0.2)

## 2023-11-16 LAB — PROTIME-INR
INR: 1.8 — ABNORMAL HIGH (ref 0.8–1.2)
Prothrombin Time: 21.7 s — ABNORMAL HIGH (ref 11.4–15.2)

## 2023-11-16 LAB — BODY FLUID CULTURE W GRAM STAIN
Culture: NO GROWTH
Gram Stain: NONE SEEN

## 2023-11-16 LAB — BASIC METABOLIC PANEL WITH GFR
Anion gap: 8 (ref 5–15)
BUN: 7 mg/dL (ref 6–20)
CO2: 25 mmol/L (ref 22–32)
Calcium: 7.8 mg/dL — ABNORMAL LOW (ref 8.9–10.3)
Chloride: 99 mmol/L (ref 98–111)
Creatinine, Ser: 0.82 mg/dL (ref 0.61–1.24)
GFR, Estimated: >60 mL/min (ref >60)
Glucose, Bld: 108 mg/dL — ABNORMAL HIGH (ref 70–99)
Potassium: 4 mmol/L (ref 3.5–5.1)
Sodium: 132 mmol/L — ABNORMAL LOW (ref 135–145)

## 2023-11-16 LAB — TYPE AND SCREEN
ABO/RH(D): O POS
Antibody Screen: NEGATIVE

## 2023-11-16 MED ORDER — LACTULOSE 10 GM/15ML PO SOLN: 20.0000 g | Freq: Every day | ORAL | 0 refills | Status: AC

## 2023-11-16 MED ORDER — SPIRONOLACTONE 100 MG PO TABS: 100.0000 mg | ORAL_TABLET | Freq: Every day | ORAL | 0 refills | Status: DC

## 2023-11-16 MED ORDER — FUROSEMIDE 40 MG PO TABS: 40.0000 mg | ORAL_TABLET | Freq: Two times a day (BID) | ORAL | 0 refills | Status: AC

## 2023-11-16 MED ORDER — SODIUM CHLORIDE 0.9% IV SOLUTION: Freq: Once | INTRAVENOUS | Status: AC

## 2023-11-16 NOTE — Plan of Care (Signed)

## 2023-11-16 NOTE — Progress Notes (Signed)
 NAME:  ILDEFONSO KEANEY, MRN:  [id number], DOB:  1980/06/24, LOS: 4 ADMISSION DATE:  11/11/2023, CONSULTATION DATE:  11/16/23 REFERRING MD:  Dr Deliliah, CHIEF COMPLAINT:  R pleural effusion   History of Present Illness:  43 year old male with a past medical history of alcoholic liver cirrhosis complicated by portal hypertension, esophageal varices and portal gastropathy presenting to the hospital with epistaxis, shortness of breath and abdominal distention due to decompensated liver cirrhosis.   Here in the hospital he is noted to have low-grade fevers with Tmax 101.  He is also on 2 L oxygen although no hypoxia episodes in his chart. His laboratory workup is significant for mild hyponatremia, no AKI, slight elevation in AST, leukopenia, anemia and thrombocytopenia with a platelet count of 23.  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  He is also noted to have moderate volume abdominopelvic ascites and anasarca.  Pulmonary was consulted for management of his right pleural effusion.   At bedside, the patient is not in any respiratory distress.  He states that his breathing was worse yesterday but it slightly improved today.  He is currently on 2 L oxygen.  He states that he has never had a thoracentesis but has had paracentesis in the past.  He continues to drink alcohol but he is trying to cut down.   Pertinent  Medical History  As mentioned above   Interim History / Subjective:  Reports some shortness of breath but no distress Remains on 2 L [place] He is wanting to go home after his platelets transfusion is completed  Objective    Blood pressure 126/83, pulse 95, temperature 98.4 F (36.9 C), temperature source Oral, resp. rate 16, height 5' 10 (1.778 m), weight 83.5 kg, SpO2 95%.        Intake/Output Summary (Last 24 hours) at 11/16/2023 1222 Last data filed at 11/16/2023 0150 Gross per 24  hour  Intake 841 ml  Output 600 ml  Net 241 ml   Filed Weights   11/12/23 0118  Weight: 83.5 kg    Examination: General: Middle-aged male not in acute distress, unchanged HENT: Moist mucous membranes, unchanged Lungs: reduced BS right lung, clear entry on the left lung, unchanged Cardiovascular: RRR, normal S1, S2, unchanged Abdomen: Soft, positive bowel sounds, slightly distended, unchanged Extremities: No edema, unchanged Neuro: Motor and sensation grossly intact. Unchanged    Assessment and Plan   This is a 43 year old male with alcoholic liver cirrhosis presenting with epistaxis, ascites and right-sided pleural effusion likely in the setting of hepatic hydrothorax.    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 He says his girlfriend will be here to pick him up after his platelets transfusions are completed  I advised him to be complaint with diuretics Would benefit from BB and close GI follow up - given portal HTN  Recommend home oxygen evaluation at the time of d/c.    Labs   CBC: Recent Labs  Lab 11/11/23 1305 11/12/23 0613 11/12/23 1437 11/13/23 0703 11/14/23 0445 11/15/23 0332 11/16/23 0151  WBC 5.5 3.5*  --  2.9* 2.1* 2.5* 2.8*  NEUTROABS 4.4  --   --  1.6* 1.0* 1.4* 1.7  HGB 7.9* 6.5* 7.3* 7.1* 7.1* 7.1* 7.1*  HCT 26.1* 21.6* 23.4* 23.3* 23.3* 22.8* 22.7*  MCV 86.1 85.7  --  86.3 86.0 85.1 86.0  PLT 23* 23*  --  23* 29* 34* 38*    Basic Metabolic Panel: Recent Labs  Lab 11/11/23 1305 11/12/23 0613 11/13/23 0703 11/15/23 0332 11/16/23 0151  NA 132* 133* 135 132* 132*  K 3.5 4.3 3.6 3.1* 4.0  CL 99 99 97* 95* 99  CO2 23 25 24 26  25   GLUCOSE 121* 102* 109* 126* 108*  BUN 11 12 8  <5* 7  CREATININE 0.78 0.87 0.67 0.86 0.82  CALCIUM  7.6* 7.5* 7.2* 7.5* 7.8*   GFR: Estimated Creatinine Clearance: 121.2 mL/min (by C-G formula based on SCr of 0.82 mg/dL). Recent Labs  Lab 11/11/23 1318 11/11/23 1619 11/12/23 9386 11/13/23 0703 11/14/23 0445 11/15/23 0332 11/16/23 0151  WBC  --   --    < > 2.9* 2.1* 2.5* 2.8*  LATICACIDVEN 2.6* 2.0*  --   --   --   --   --    < > = values in this interval not displayed.    Liver Function Tests: Recent Labs  Lab 11/11/23 1305 11/12/23 0613 11/13/23 0703 11/14/23 0445  AST 76* 58* 50* 48*  ALT 27 22 21 18   ALKPHOS 84 73 91 93  BILITOT 5.0* 3.1* 2.5* 2.5*  PROT 7.3 6.3* 6.1* 6.3*  ALBUMIN  2.3* 1.9* 1.8* 1.8*   Recent Labs  Lab 11/11/23 1305  LIPASE 31   No results for input(s): AMMONIA in the last 168 hours.  ABG No results found for: PHART, PCO2ART, PO2ART, HCO3, TCO2, ACIDBASEDEF, O2SAT   Coagulation Profile: Recent Labs  Lab 11/11/23 1305 11/16/23 1052  INR 1.8* 1.8*    Cardiac Enzymes: No results for input(s): CKTOTAL, CKMB, CKMBINDEX, TROPONINI in the last 168 hours.  HbA1C: No results found for: HGBA1C  CBG: No results for input(s): GLUCAP in the last 168 hours.  Review of Systems:   As above  Past Medical History:  He,  has a past medical history of Acute blood loss anemia (10/01/2022), Acute upper GI bleeding (09/29/2022), Anxiety, Ascites, Clotting disorder, Depression, Hyperlipidemia (12/04/2012), Hypertension, Left shoulder pain (08/28/2015), and Substance abuse (HCC).   Surgical History:   Past Surgical History:  Procedure Laterality Date   ESOPHAGOGASTRODUODENOSCOPY N/A 04/12/2023   Procedure: EGD (ESOPHAGOGASTRODUODENOSCOPY);  Surgeon: Jinny Carmine, MD;  Location: [facility] ENDOSCOPY;  Service: Endoscopy;  Laterality: N/A;   ESOPHAGOGASTRODUODENOSCOPY N/A 05/13/2023   Procedure: EGD  (ESOPHAGOGASTRODUODENOSCOPY);  Surgeon: Onita Elspeth Sharper, DO;  Location[facility] END[facility]COPY;  Service: Gastroenterology;  Laterality: N/A;   ESOPHAGOGASTRODUODENOSCOPY N/A 06/03/2023   Procedure: EGD (ESOPHAGOGASTRODUODENOSCOPY);  Surgeon: Onita Elspeth Sharper, DO;  Location: [facility] ENDOSCOPY;  Service: Gastroenterology;  Laterality: N/A;   ESOPHAGOGASTRODUODENOSCOPY (EGD) WITH PROPOFOL  N/A 09/30/2022   Procedure: ESOPHAGOGASTRODUODENOSCOPY (EGD) WITH PROPOFOL ;  Surgeon: Toledo, Ladell POUR, MD;  Location: [facility] ENDOSCOPY;  Service: Gastroenterology;  Laterality: N/A;   GASTRIC VARICES BANDING  04/12/2023   Procedure: BAND LIGATION, GASTRIC VARICES;  Surgeon: Jinny Carmine, MD;  Location: [facility] ENDOSCOPY;  Service: Endoscopy;;   KNEE SURGERY     left shoulder surgery Left 2024   [facility]     Social History:   reports that he has never smoked. He has never used smokeless tobacco. He reports that he does not currently use alcohol after a past usage of about 1.0 standard drink of alcohol per week. He reports  that he does not use drugs.   Family History:  His family history includes Alcohol abuse in his father; Diabetes in his father and mother; Early death in his father and mother; Heart disease in his father and mother; Hyperlipidemia in his father and mother; Hypertension in his father and mother; Kidney disease in his father; Obesity in his father and mother.   Allergies No Known Allergies   Home Medications  Prior to Admission medications   Medication Sig Start Date End Date Taking? Authorizing Provider  sertraline  (ZOLOFT ) 50 MG tablet TAKE 1 TABLET(50 MG) BY MOUTH DAILY Patient not taking: Reported on 11/11/2023 06/02/23   Vincente Shivers, NP

## 2023-11-16 NOTE — Discharge Summary (Signed)
 Physician Discharge Summary   Patient: Robert Sellers MRN: [id number] DOB: 11/03/1980  Admit date:     11/11/2023  Discharge date: 11/16/23  Discharge Physician: Garnette Pelt   PCP: Vincente Shivers, NP   Recommendations at discharge:    Discharge Diagnoses: Principal Problem:   Epistaxis Active Problems:   Decompensated cirrhosis (HCC)   Pleural effusion   Other ascites  Resolved Problems:   * No resolved hospital problems. *  Hospital Course: 43 y.o. male with medical history significant of multiple comorbidities including alcoholic cirrhosis, complicated by ascites, portal hypertension and esophageal varices, nephrolithiasis, who presented to the ED with chief complaint of shortness of breath, abdominal pain, nasal bleed as well as blood in urine. GI consulted. S/p right sided thoracentesis on 10/12. Unsuccessful attempt at paracentesis by IR  Assessment and Plan: Epistaxis:  -In the setting of thrombocytopenia. -Mild recurrence night prior to leaving AMA -S/p transfusion of one unit of platelet on 10/10, 10/12, and 10/15 and one unit of PRBC on 10/11 -Pt elected to leave AMA following completion of final platelet without recheck of CBC.     Hematuria:  -In the setting of thrombocytopenia and h/o nephrolithiasis. Resolved now. CT abdomen didn't show any obstruction/hydronephrosis Spoke to Dr Mitchell and no urological intervention needed inpatient.  Patient will follow-up outpatient with urology for nephrolithiasis.   Alcoholic cirrhosis complicated by abdominopelvic ascites, portal hypertension and esophageal varices,POA: GI consulted Continue with lasix 40 mg BID daily and aldactone  100 mg daily Holding coreg F/u AFP levels-2.1 Continue with lactulose  Child pugh class C (Life expectancy 1-3 years) MELD Na 22 indicating 3 month mortality of 20% IR consulted on 10/13 for paracentesis but not enough fluid to drain. Prescribed lasix and spironolactone   Advised pt to f/u  with primary GI   Thrombocytopenia: PLT of 23, s/p platelet transfusion 10/10, 10/12, 10/15 S/p esophageal banding done earlier this year    Acute blood loss anemia: Hb  7.1 . S/p one unit of PRBC In the setting of hematuria and slight epistaxis Hemodynamically stable Pt elected to leave AMA prior to CBC recheck post-platelet transfusion    Right sided pleural effusion,POA: -Likely hepatic hydrothorax -Appreciate pulm consult. Thoracentesis done on 10/12 by Dr Zaida and removal of 2 L of yellow fluid was done without any complications. -Passed O2 trial on ambulation, did not require home o2 at time of pt leaving AMA    Alcohol abuse,POA: Continue with thiamine  and folate Alcohol cessation counseling TOC consult  Placed on CIWA protocol this visit Was given naltrexone. Patient was on it as an outpatient until 6 months back.       Consultants: GI, Pulmonary, IR Procedures performed: thoracentesis  Disposition: Left AMA  DISCHARGE MEDICATION: Allergies as of 11/16/2023   No Known Allergies      Medication List     STOP taking these medications    sertraline  50 MG tablet Commonly known as: ZOLOFT        TAKE these medications    furosemide 40 MG tablet Commonly known as: LASIX Take 1 tablet (40 mg total) by mouth 2 (two) times daily.   lactulose  10 GM/15ML solution Commonly known as: CHRONULAC  Take 30 mLs (20 g total) by mouth daily. Start taking on: November 17, 2023   spironolactone  100 MG tablet Commonly known as: ALDACTONE  Take 1 tablet (100 mg total) by mouth daily. Start taking on: November 17, 2023        Discharge Exam: Fredricka Weights   11/12/23 0118  Weight: 83.5 kg   General exam: Awake, laying in bed, in nad Respiratory system: Normal respiratory effort, no wheezing Cardiovascular system: regular rate, s1, s2 Gastrointestinal system: Soft, nondistended, positive BS Central nervous system: CN2-12 grossly intact, strength  intact Extremities: Perfused, no clubbing Skin: Normal skin turgor, no notable skin lesions seen Psychiatry: Mood normal // no visual hallucinations   The results of significant diagnostics from this hospitalization (including imaging, microbiology, ancillary and laboratory) are listed below for reference.   Imaging Studies: DG CHEST PORT 1 VIEW Result Date: 11/15/2023 EXAM: 1 VIEW(S) XRAY OF THE CHEST 11/15/2023 12:39:00 PM COMPARISON: Comparison is made to the study of 11/13/2023. CLINICAL HISTORY: Dyspnea ; Pt states SOB for a while FINDINGS: LUNGS AND PLEURA: Large right pleural effusion is again noted which is increased in size compared to prior exam. Right upper lobe atelectasis or infiltrate is noted. Minimal left pleural effusion. No pulmonary edema. No pneumothorax. HEART AND MEDIASTINUM: No acute abnormality of the cardiac and mediastinal silhouettes. BONES AND SOFT TISSUES: No acute osseous abnormality. IMPRESSION: 1. Large right pleural effusion, increased compared to prior. 2. Right upper lobe atelectasis or infiltrate. 3. Minimal left pleural effusion. Electronically signed by: Lynwood Seip MD 11/15/2023 04:23 PM EDT RP Workstation: [id number]   IR ABDOMEN US  LIMITED Result Date: 11/14/2023 CLINICAL DATA:  43 year old male with medical history of alcoholic cirrhosis. Received request for paracentesis. EXAM: ULTRASOUND ABDOMEN LIMITED COMPARISON:  11/11/2023 CT Chest, Abdomen, and Pelvis FINDINGS: Small amount of ascites. No significant pocket of fluid to allow for safe approach for paracentesis. All four abdominal quadrants were examined. IMPRESSION: Small amount of ascites.  Paracentesis was not performed. Performed by: Kristi Davenport, NP Electronically Signed   By: Juliene Balder M.D.   On: 11/14/2023 14:14   DG CHEST PORT 1 VIEW Result Date: 11/13/2023 EXAM: 1 VIEW(S) XRAY OF THE CHEST 11/13/2023 11:16:00 AM COMPARISON: 11/11/2023 CLINICAL HISTORY: Pleural effusion FINDINGS: LUNGS AND  PLEURA: Low lung volumes. Large right pleural effusion with right lung atelectasis and/or consolidation. Rounded pleural-based opacity overlying right upper lung, possibly representing loculated pleural fluid. No pneumothorax. HEART AND MEDIASTINUM: No acute abnormality of the cardiac and mediastinal silhouettes. BONES AND SOFT TISSUES: No acute osseous abnormality. IMPRESSION: 1. Large right pleural effusion with right lung atelectasis and/or consolidation; loculated pleural fluid suspected. 2. Low lung volumes. Electronically signed by: Waddell Calk MD 11/13/2023 12:10 PM EDT RP Workstation: [id number]   CT CHEST ABDOMEN PELVIS W CONTRAST Result Date: 11/11/2023 EXAM: CT CHEST, ABDOMEN AND PELVIS WITH CONTRAST 11/11/2023 04:49:15 PM TECHNIQUE: CT of the chest, abdomen and pelvis was performed with the administration of 75 mL of iohexol  (OMNIPAQUE ) 350 MG/ML injection. Multiplanar reformatted images are provided for review. Automated exposure control, iterative reconstruction, and/or weight based adjustment of the mA/kV was utilized to reduce the radiation dose to as low as reasonably achievable. COMPARISON: 04/22/2023 CLINICAL HISTORY: RLQ abdominal pain. FINDINGS: CHEST: MEDIASTINUM AND LYMPH NODES: Heart and pericardium are unremarkable. Mild scattered coronary calcifications. There is mild leftward shift of the mediastinum. The central airways are clear. No mediastinal, hilar or axillary lymphadenopathy. LUNGS AND PLEURA: Large right pleural effusion with significant atelectasis in the right lung, new since previous. Left lung remains clear. No pneumothorax. ABDOMEN AND PELVIS: LIVER: Mildly nodular hepatic contour without focal lesion. Portal vein patent. Small to moderate volume abdominal ascites mostly in the pelvis and perihepatic. GALLBLADDER AND BILE DUCTS: Gallbladder is physiologically distended. No biliary ductal dilatation. SPLEEN: Splenomegaly without focal lesion. Reconstituted  falciform  ligament venous collateral channels. PANCREAS: No acute abnormality. ADRENAL GLANDS: No acute abnormality. KIDNEYS, URETERS AND BLADDER: Bilateral nephrolithiasis, largest 1 cm right lower pole. No ureteral calculus or significant hydronephrosis. No perinephric or periureteral stranding. Urinary bladder is unremarkable. GI AND BOWEL: A few gas and fluid distended right upper quadrant small bowel loops. The stomach and distal small bowel remain decompressed. Colon is nondistended. There is no bowel obstruction. REPRODUCTIVE ORGANS: Mild prostate enlargement with central coarse calcifications. PERITONEUM AND RETROPERITONEUM: Small to moderate volume abdominal ascites mostly in the pelvis and perihepatic. No free air. VASCULATURE: Aorta is normal in caliber. ABDOMINAL AND PELVIS LYMPH NODES: No lymphadenopathy. 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 No acute osseous abnormality otherwise. No focal soft tissue abnormality. IMPRESSION: 1. Large right pleural effusion or hepatic hydrothorax causing significant right lung atelectasis and mild leftward mediastinal shift. 2. Small to moderate abdominal ascites. 3. Hepatic cirrhosis with stigmata of portal venous hypertension 4. Bilateral nephrolithiasis, largest 1 cm in the right lower pole, without ureteral calculus or significant hydronephrosis. 5. 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 Electronically signed by: Dayne Hassell MD 11/11/2023 05:04 PM EDT RP Workstation: [id number]   DG Chest 2 View Result Date: 11/11/2023 EXAM: 2 VIEW(S) XRAY OF THE CHEST 11/11/2023 02:32:00 PM COMPARISON: 03/02/2018 CLINICAL HISTORY: SHOB. FINDINGS: LUNGS AND PLEURA: Large and possibly loculated right pleural effusion is noted. No focal pulmonary opacity. No pulmonary edema. No pneumothorax. HEART AND MEDIASTINUM: No acute abnormality of the cardiac and mediastinal silhouettes. BONES AND SOFT TISSUES: No  acute osseous abnormality. IMPRESSION: 1. Large, possibly loculated right pleural effusion. Electronically signed by: Lynwood Seip MD 11/11/2023 02:52 PM EDT RP Workstation: [id number]    Microbiology: Results for orders placed or performed during the hospital encounter of 11/11/23  Body fluid culture w Gram Stain     Status: None   Collection Time: 11/13/23 10:07 AM   Specimen: Pleural Fluid  Result Value Ref Range Status   Specimen Description PLEURAL  Final   Special Requests PLEURAL,RIGHT  Final   Gram Stain NO WBC SEEN NO ORGANISMS SEEN   Final   Culture   Final    NO GROWTH 3 DAYS Performed at [facility], [address] [place], [place] [address]    Report Status 11/16/2023 FINAL  Final    Labs: CBC: Recent Labs  Lab 11/11/23 1305 11/12/23 9386 11/12/23 1437 11/13/23 0703 11/14/23 0445 11/15/23 0332 11/16/23 0151  WBC 5.5 3.5*  --  2.9* 2.1* 2.5* 2.8*  NEUTROABS 4.4  --   --  1.6* 1.0* 1.4* 1.7  HGB 7.9* 6.5* 7.3* 7.1* 7.1* 7.1* 7.1*  HCT 26.1* 21.6* 23.4* 23.3* 23.3* 22.8* 22.7*  MCV 86.1 85.7  --  86.3 86.0 85.1 86.0  PLT 23* 23*  --  23* 29* 34* 38*   Basic Metabolic Panel: Recent Labs  Lab 11/11/23 1305 11/12/23 0613 11/13/23 0703 11/15/23 0332 11/16/23 0151  NA 132* 133* 135 132* 132*  K 3.5 4.3 3.6 3.1* 4.0  CL 99 99 97* 95* 99  CO2 23 25 24 26 25   GLUCOSE 121* 102* 109* 126* 108*  BUN 11 12 8  <5* 7  CREATININE 0.78 0.87 0.67 0.86 0.82  CALCIUM  7.6* 7.5* 7.2* 7.5* 7.8*   Liver Function Tests: Recent Labs  Lab 11/11/23 1305 11/12/23 0613 11/13/23 0703 11/14/23 0445  AST 76* 58* 50* 48*  ALT 27 22 21 18   ALKPHOS 84 73  91 93  BILITOT 5.0* 3.1* 2.5* 2.5*  PROT 7.3 6.3* 6.1* 6.3*  ALBUMIN  2.3* 1.9* 1.8* 1.8*   CBG: No results for input(s): GLUCAP in the last 168 hours.  Discharge time spent: less than 30 minutes.  Signed: Garnette Pelt, MD [facility] 11/16/2023

## 2023-11-16 NOTE — Progress Notes (Signed)
 Pt left without waiting for the doctor to put in his discharge papers, he didn't want to wait on him and did not want to sign ama papers either. Iv taken out and doctor aware.

## 2023-11-17 LAB — PREPARE PLATELET PHERESIS: Unit division: 0

## 2023-11-17 LAB — BPAM PLATELET PHERESIS
Blood Product Expiration Date: 202510182359
ISSUE DATE / TIME: 202510150951
Unit Type and Rh: 7300

## 2023-11-23 ENCOUNTER — Telehealth: Payer: Self-pay

## 2023-11-23 NOTE — Transitions of Care (Post Inpatient/ED Visit) (Unsigned)
   11/23/2023  Name: Robert Sellers MRN: [id number] DOB: 04-24-1980  Today's TOC FU Call Status: Today's TOC FU Call Status:: Unsuccessful Call (1st Attempt) Unsuccessful Call (1st Attempt) Date: 11/23/23  Attempted to reach the patient regarding the most recent Inpatient/ED visit.  Follow Up Plan: Additional outreach attempts will be made to reach the patient to complete the Transitions of Care (Post Inpatient/ED visit) call.   Signature Julian Lemmings, LPN [facility] Nurse Health Advisor Direct Dial [phone]

## 2023-11-24 NOTE — Transitions of Care (Post Inpatient/ED Visit) (Signed)
   11/24/2023  Name: AFSHIN CHRYSTAL MRN: [id number] DOB: 1980/11/01  Today's TOC FU Call Status: Today's TOC FU Call Status:: Unsuccessful Call (1st Attempt) Unsuccessful Call (1st Attempt) Date: 11/23/23  Attempted to reach the patient regarding the most recent Inpatient/ED visit.  Follow Up Plan: No further outreach attempts will be made at this time. We have been unable to contact the patient. Patient has been dismissed from practice Signature Julian Lemmings, LPN [facility] Nurse Health Advisor Direct Dial [phone]

## 2023-12-29 ENCOUNTER — Other Ambulatory Visit: Payer: Self-pay

## 2023-12-29 ENCOUNTER — Inpatient Hospital Stay (HOSPITAL_COMMUNITY)
Admission: EM | Admit: 2023-12-29 | Discharge: 2024-01-02 | DRG: 432 | Disposition: A | Discharge status: Home / Self Care | Attending: Internal Medicine | Admitting: Internal Medicine

## 2023-12-29 ENCOUNTER — Encounter (HOSPITAL_COMMUNITY): Payer: Self-pay

## 2023-12-29 DIAGNOSIS — Z1152 Encounter for screening for COVID-19: Secondary | ICD-10-CM

## 2023-12-29 DIAGNOSIS — I10 Essential (primary) hypertension: Secondary | ICD-10-CM | POA: Diagnosis present

## 2023-12-29 DIAGNOSIS — Z8249 Family history of ischemic heart disease and other diseases of the circulatory system: Secondary | ICD-10-CM

## 2023-12-29 DIAGNOSIS — Z8419 Family history of other disorders of kidney and ureter: Secondary | ICD-10-CM

## 2023-12-29 DIAGNOSIS — I8510 Secondary esophageal varices without bleeding: Secondary | ICD-10-CM | POA: Diagnosis present

## 2023-12-29 DIAGNOSIS — Z811 Family history of alcohol abuse and dependence: Secondary | ICD-10-CM

## 2023-12-29 DIAGNOSIS — K766 Portal hypertension: Secondary | ICD-10-CM | POA: Diagnosis present

## 2023-12-29 DIAGNOSIS — K7290 Hepatic failure, unspecified without coma: Secondary | ICD-10-CM | POA: Diagnosis present

## 2023-12-29 DIAGNOSIS — D638 Anemia in other chronic diseases classified elsewhere: Secondary | ICD-10-CM | POA: Diagnosis present

## 2023-12-29 DIAGNOSIS — F1010 Alcohol abuse, uncomplicated: Secondary | ICD-10-CM | POA: Diagnosis present

## 2023-12-29 DIAGNOSIS — J918 Pleural effusion in other conditions classified elsewhere: Secondary | ICD-10-CM | POA: Diagnosis present

## 2023-12-29 DIAGNOSIS — K7031 Alcoholic cirrhosis of liver with ascites: Principal | ICD-10-CM | POA: Diagnosis present

## 2023-12-29 DIAGNOSIS — D61818 Other pancytopenia: Secondary | ICD-10-CM | POA: Diagnosis present

## 2023-12-29 DIAGNOSIS — K3189 Other diseases of stomach and duodenum: Secondary | ICD-10-CM | POA: Diagnosis present

## 2023-12-29 DIAGNOSIS — Z83438 Family history of other disorder of lipoprotein metabolism and other lipidemia: Secondary | ICD-10-CM

## 2023-12-29 DIAGNOSIS — E785 Hyperlipidemia, unspecified: Secondary | ICD-10-CM | POA: Diagnosis present

## 2023-12-29 DIAGNOSIS — Z79899 Other long term (current) drug therapy: Secondary | ICD-10-CM

## 2023-12-29 DIAGNOSIS — D731 Hypersplenism: Secondary | ICD-10-CM | POA: Diagnosis present

## 2023-12-29 DIAGNOSIS — E876 Hypokalemia: Secondary | ICD-10-CM | POA: Diagnosis present

## 2023-12-29 DIAGNOSIS — J9811 Atelectasis: Secondary | ICD-10-CM | POA: Diagnosis present

## 2023-12-29 DIAGNOSIS — J90 Pleural effusion, not elsewhere classified: Principal | ICD-10-CM | POA: Diagnosis present

## 2023-12-29 DIAGNOSIS — R7989 Other specified abnormal findings of blood chemistry: Secondary | ICD-10-CM | POA: Diagnosis present

## 2023-12-29 DIAGNOSIS — J189 Pneumonia, unspecified organism: Secondary | ICD-10-CM | POA: Diagnosis present

## 2023-12-29 DIAGNOSIS — D649 Anemia, unspecified: Secondary | ICD-10-CM

## 2023-12-29 DIAGNOSIS — Z833 Family history of diabetes mellitus: Secondary | ICD-10-CM

## 2023-12-29 DIAGNOSIS — E871 Hypo-osmolality and hyponatremia: Secondary | ICD-10-CM | POA: Diagnosis present

## 2023-12-29 DIAGNOSIS — D696 Thrombocytopenia, unspecified: Secondary | ICD-10-CM | POA: Diagnosis present

## 2023-12-29 DIAGNOSIS — D509 Iron deficiency anemia, unspecified: Secondary | ICD-10-CM | POA: Diagnosis present

## 2023-12-29 DIAGNOSIS — R040 Epistaxis: Secondary | ICD-10-CM | POA: Diagnosis present

## 2023-12-29 NOTE — ED Triage Notes (Signed)
 POV from home. Cc of SOB for a few hours. Says that he has a right pleural effusion and has a hx of ascites and feels like he might need a thoracentesis. 7/10 pain.

## 2023-12-30 ENCOUNTER — Encounter (HOSPITAL_COMMUNITY): Payer: Self-pay | Admitting: Internal Medicine

## 2023-12-30 ENCOUNTER — Inpatient Hospital Stay (HOSPITAL_COMMUNITY)

## 2023-12-30 ENCOUNTER — Emergency Department (HOSPITAL_COMMUNITY)

## 2023-12-30 ENCOUNTER — Other Ambulatory Visit (HOSPITAL_COMMUNITY): Payer: Self-pay | Admitting: *Deleted

## 2023-12-30 DIAGNOSIS — D61818 Other pancytopenia: Secondary | ICD-10-CM

## 2023-12-30 DIAGNOSIS — D696 Thrombocytopenia, unspecified: Secondary | ICD-10-CM

## 2023-12-30 DIAGNOSIS — R748 Abnormal levels of other serum enzymes: Secondary | ICD-10-CM

## 2023-12-30 DIAGNOSIS — J189 Pneumonia, unspecified organism: Secondary | ICD-10-CM | POA: Diagnosis not present

## 2023-12-30 DIAGNOSIS — J90 Pleural effusion, not elsewhere classified: Secondary | ICD-10-CM | POA: Diagnosis present

## 2023-12-30 DIAGNOSIS — K7030 Alcoholic cirrhosis of liver without ascites: Secondary | ICD-10-CM

## 2023-12-30 DIAGNOSIS — K7290 Hepatic failure, unspecified without coma: Secondary | ICD-10-CM

## 2023-12-30 DIAGNOSIS — R7989 Other specified abnormal findings of blood chemistry: Secondary | ICD-10-CM | POA: Diagnosis present

## 2023-12-30 DIAGNOSIS — K7460 Unspecified cirrhosis of liver: Secondary | ICD-10-CM

## 2023-12-30 DIAGNOSIS — R7401 Elevation of levels of liver transaminase levels: Secondary | ICD-10-CM

## 2023-12-30 DIAGNOSIS — R0609 Other forms of dyspnea: Secondary | ICD-10-CM | POA: Diagnosis not present

## 2023-12-30 LAB — CBC
HCT: 24.1 % — ABNORMAL LOW (ref 39.0–52.0)
Hemoglobin: 7.9 g/dL — ABNORMAL LOW (ref 13.0–17.0)
MCH: 30.3 pg (ref 26.0–34.0)
MCHC: 32.8 g/dL (ref 30.0–36.0)
MCV: 92.3 fL (ref 80.0–100.0)
Platelets: 30 K/uL — ABNORMAL LOW (ref 150–400)
RBC: 2.61 MIL/uL — ABNORMAL LOW (ref 4.22–5.81)
RDW: 21.2 % — ABNORMAL HIGH (ref 11.5–15.5)
WBC: 2.1 K/uL — ABNORMAL LOW (ref 4.0–10.5)
nRBC: 0 % (ref 0.0–0.2)

## 2023-12-30 LAB — RESPIRATORY PANEL BY PCR

## 2023-12-30 LAB — URINALYSIS, W/ REFLEX TO CULTURE (INFECTION SUSPECTED)
Bacteria, UA: NONE SEEN
Bilirubin Urine: NEGATIVE
Glucose, UA: NEGATIVE mg/dL
Ketones, ur: NEGATIVE mg/dL
Leukocytes,Ua: NEGATIVE
Nitrite: NEGATIVE
Protein, ur: 30 mg/dL — AB
Specific Gravity, Urine: 1.019 (ref 1.005–1.030)
pH: 5 (ref 5.0–8.0)

## 2023-12-30 LAB — URINALYSIS, COMPLETE (UACMP) WITH MICROSCOPIC
Bacteria, UA: NONE SEEN
Bilirubin Urine: NEGATIVE
Glucose, UA: NEGATIVE mg/dL
Ketones, ur: NEGATIVE mg/dL
Leukocytes,Ua: NEGATIVE
Nitrite: NEGATIVE
Protein, ur: NEGATIVE mg/dL
Specific Gravity, Urine: 1.035 — ABNORMAL HIGH (ref 1.005–1.030)
pH: 5 (ref 5.0–8.0)

## 2023-12-30 LAB — BASIC METABOLIC PANEL WITH GFR
Anion gap: 10 (ref 5–15)
BUN: 12 mg/dL (ref 6–20)
CO2: 24 mmol/L (ref 22–32)
Calcium: 8.1 mg/dL — ABNORMAL LOW (ref 8.9–10.3)
Chloride: 103 mmol/L (ref 98–111)
Creatinine, Ser: 0.76 mg/dL (ref 0.61–1.24)
GFR, Estimated: >60 mL/min (ref >60)
Glucose, Bld: 114 mg/dL — ABNORMAL HIGH (ref 70–99)
Potassium: 3.6 mmol/L (ref 3.5–5.1)
Sodium: 137 mmol/L (ref 135–145)

## 2023-12-30 LAB — PHOSPHORUS: Phosphorus: 3.6 mg/dL (ref 2.5–4.6)

## 2023-12-30 LAB — BODY FLUID CELL COUNT WITH DIFFERENTIAL
Eos, Fluid: 1 %
Lymphs, Fluid: 29 %
Monocyte-Macrophage-Serous Fluid: 70 % (ref 50–90)
Neutrophil Count, Fluid: 0 % (ref 0–25)
Total Nucleated Cell Count, Fluid: 97 uL (ref 0–1000)

## 2023-12-30 LAB — IRON AND TIBC
Iron: 60 ug/dL (ref 45–182)
Saturation Ratios: 16 % — ABNORMAL LOW (ref 17.9–39.5)
TIBC: 372 ug/dL (ref 250–450)
UIBC: 313 ug/dL

## 2023-12-30 LAB — HEPATIC FUNCTION PANEL
ALT: 38 U/L (ref 0–44)
AST: 86 U/L — ABNORMAL HIGH (ref 15–41)
Albumin: 3.4 g/dL — ABNORMAL LOW (ref 3.5–5.0)
Alkaline Phosphatase: 163 U/L — ABNORMAL HIGH (ref 38–126)
Bilirubin, Direct: 1 mg/dL — ABNORMAL HIGH (ref 0.0–0.2)
Indirect Bilirubin: 0.7 mg/dL (ref 0.3–0.9)
Total Bilirubin: 1.7 mg/dL — ABNORMAL HIGH (ref 0.0–1.2)
Total Protein: 6.9 g/dL (ref 6.5–8.1)

## 2023-12-30 LAB — URINE DRUG SCREEN
Amphetamines: NEGATIVE
Barbiturates: NEGATIVE
Benzodiazepines: NEGATIVE
Cocaine: NEGATIVE
Fentanyl: NEGATIVE
Methadone Scn, Ur: NEGATIVE
Opiates: NEGATIVE
Tetrahydrocannabinol: POSITIVE — AB

## 2023-12-30 LAB — LACTIC ACID, PLASMA: Lactic Acid, Venous: 1.6 mmol/L (ref 0.5–1.9)

## 2023-12-30 LAB — MAGNESIUM: Magnesium: 1.5 mg/dL — ABNORMAL LOW (ref 1.7–2.4)

## 2023-12-30 LAB — D-DIMER, QUANTITATIVE: D-Dimer, Quant: 3.47 ug{FEU}/mL — ABNORMAL HIGH (ref 0.00–0.50)

## 2023-12-30 LAB — PROTEIN, PLEURAL OR PERITONEAL FLUID: Total protein, fluid: <3 g/dL

## 2023-12-30 LAB — VITAMIN B12: Vitamin B-12: 849 pg/mL (ref 180–914)

## 2023-12-30 LAB — MRSA NEXT GEN BY PCR, NASAL: MRSA by PCR Next Gen: NOT DETECTED

## 2023-12-30 LAB — FERRITIN: Ferritin: 40 ng/mL (ref 24–336)

## 2023-12-30 LAB — TROPONIN T, HIGH SENSITIVITY
Troponin T High Sensitivity: <15 ng/L (ref 0–19)
Troponin T High Sensitivity: <15 ng/L (ref 0–19)

## 2023-12-30 LAB — GRAM STAIN

## 2023-12-30 LAB — LACTATE DEHYDROGENASE, PLEURAL OR PERITONEAL FLUID: LD, Fluid: 52 U/L — ABNORMAL HIGH (ref 3–23)

## 2023-12-30 LAB — RESP PANEL BY RT-PCR (RSV, FLU A&B, COVID)  RVPGX2
Influenza A by PCR: NEGATIVE
Influenza B by PCR: NEGATIVE
Resp Syncytial Virus by PCR: NEGATIVE
SARS Coronavirus 2 by RT PCR: NEGATIVE

## 2023-12-30 LAB — PROTIME-INR
INR: 1.3 — ABNORMAL HIGH (ref 0.8–1.2)
Prothrombin Time: 17 s — ABNORMAL HIGH (ref 11.4–15.2)

## 2023-12-30 LAB — LIPASE, BLOOD: Lipase: 63 U/L — ABNORMAL HIGH (ref 11–51)

## 2023-12-30 LAB — FOLATE: Folate: 19.2 ng/mL (ref >5.9)

## 2023-12-30 LAB — STREP PNEUMONIAE URINARY ANTIGEN: Strep Pneumo Urinary Antigen: NEGATIVE

## 2023-12-30 LAB — LACTATE DEHYDROGENASE: LDH: 254 U/L — ABNORMAL HIGH (ref 105–235)

## 2023-12-30 LAB — HIV ANTIBODY (ROUTINE TESTING W REFLEX): HIV Screen 4th Generation wRfx: NONREACTIVE

## 2023-12-30 LAB — PROCALCITONIN: Procalcitonin: 0.11 ng/mL

## 2023-12-30 MED ORDER — OXYCODONE HCL 5 MG PO TABS
5.0000 mg | ORAL_TABLET | Freq: Four times a day (QID) | ORAL | Status: DC | PRN
  Administered 2023-12-30 – 2024-01-02 (×9): 5 mg via ORAL
  Filled 2023-12-30 (×10): qty 1

## 2023-12-30 MED ORDER — ONDANSETRON HCL 4 MG PO TABS: 4.0000 mg | ORAL_TABLET | Freq: Four times a day (QID) | ORAL | Status: DC | PRN

## 2023-12-30 MED ORDER — LACTATED RINGERS IV SOLN: INTRAVENOUS | Status: DC

## 2023-12-30 MED ORDER — PANTOPRAZOLE SODIUM 40 MG IV SOLR
40.0000 mg | Freq: Two times a day (BID) | INTRAVENOUS | Status: DC
  Administered 2023-12-30 – 2024-01-02 (×7): 40 mg via INTRAVENOUS
  Filled 2023-12-30 (×7): qty 10

## 2023-12-30 MED ORDER — SODIUM CHLORIDE 0.9% IV SOLUTION: Freq: Once | INTRAVENOUS | Status: AC

## 2023-12-30 MED ORDER — SODIUM CHLORIDE 0.9 % IV SOLN
500.0000 mg | INTRAVENOUS | Status: DC
  Administered 2023-12-30 – 2024-01-02 (×4): 500 mg via INTRAVENOUS
  Filled 2023-12-30 (×4): qty 5

## 2023-12-30 MED ORDER — FUROSEMIDE 40 MG PO TABS
40.0000 mg | ORAL_TABLET | Freq: Two times a day (BID) | ORAL | Status: DC
  Administered 2023-12-30 – 2024-01-01 (×5): 40 mg via ORAL
  Filled 2023-12-30 (×5): qty 1

## 2023-12-30 MED ORDER — ONDANSETRON HCL 4 MG/2ML IJ SOLN
4.0000 mg | Freq: Four times a day (QID) | INTRAMUSCULAR | Status: DC | PRN
  Administered 2023-12-30: 4 mg via INTRAVENOUS
  Filled 2023-12-30: qty 2

## 2023-12-30 MED ORDER — SPIRONOLACTONE 100 MG PO TABS
100.0000 mg | ORAL_TABLET | Freq: Every day | ORAL | Status: DC
  Administered 2023-12-30 – 2023-12-31 (×2): 100 mg via ORAL
  Filled 2023-12-30: qty 1
  Filled 2023-12-30: qty 4

## 2023-12-30 MED ORDER — LACTULOSE 10 GM/15ML PO SOLN
20.0000 g | Freq: Every day | ORAL | Status: DC
  Administered 2023-12-30 – 2024-01-02 (×4): 20 g via ORAL
  Filled 2023-12-30 (×4): qty 30

## 2023-12-30 MED ORDER — LIDOCAINE HCL (PF) 2 % IJ SOLN
10.0000 mL | Freq: Once | INTRAMUSCULAR | Status: AC
  Administered 2023-12-30: 10 mL
  Filled 2023-12-30: qty 10

## 2023-12-30 MED ORDER — MAGNESIUM SULFATE 2 GM/50ML IV SOLN
2.0000 g | Freq: Once | INTRAVENOUS | Status: AC
  Administered 2023-12-30: 2 g via INTRAVENOUS
  Filled 2023-12-30: qty 50

## 2023-12-30 MED ORDER — LIDOCAINE HCL (PF) 2 % IJ SOLN
INTRAMUSCULAR | Status: AC
  Filled 2023-12-30: qty 10

## 2023-12-30 MED ORDER — SODIUM CHLORIDE 0.9 % IV SOLN
1.0000 g | Freq: Once | INTRAVENOUS | Status: AC
  Administered 2023-12-30: 1 g via INTRAVENOUS
  Filled 2023-12-30: qty 10

## 2023-12-30 MED ORDER — ONDANSETRON HCL 4 MG/2ML IJ SOLN
4.0000 mg | Freq: Once | INTRAMUSCULAR | Status: AC
  Administered 2023-12-30: 4 mg via INTRAVENOUS
  Filled 2023-12-30: qty 2

## 2023-12-30 MED ORDER — DM-GUAIFENESIN ER 30-600 MG PO TB12
1.0000 | ORAL_TABLET | Freq: Two times a day (BID) | ORAL | Status: DC
  Administered 2023-12-30 – 2024-01-02 (×7): 1 via ORAL
  Filled 2023-12-30 (×7): qty 1

## 2023-12-30 MED ORDER — SODIUM CHLORIDE 0.9 % IV SOLN
2.0000 g | INTRAVENOUS | Status: AC
  Administered 2023-12-30 – 2024-01-02 (×4): 2 g via INTRAVENOUS
  Filled 2023-12-30 (×4): qty 20

## 2023-12-30 MED ORDER — IOHEXOL 350 MG/ML SOLN
75.0000 mL | Freq: Once | INTRAVENOUS | Status: AC | PRN
  Administered 2023-12-30: 75 mL via INTRAVENOUS

## 2023-12-30 NOTE — Progress Notes (Addendum)
 PROGRESS NOTE  Robert Sellers FMW:[id number] DOB: 1980/11/16 DOA: 12/29/2023 PCP: Vincente Shivers, NP  Brief History:  43 year old male with a history of alcoholic liver cirrhosis, hepatic hydrothorax, portal hypertension with esophageal varices, alcohol abuse presenting with shortness of breath for about a week.  He states that worsened significantly on the evening of 12/29/2023.  He endorsed a fever of 103.0 F at home on 12/29/2023.  He denies headache, coughing, hemoptysis, nausea, vomiting, diarrhea, abdominal pain.  He did have some chest discomfort substernal yesterday.  He denies any worsening lower extremity edema.  He does feel that his abdomen is little bit distended.  He endorses compliance with all his medications.  He states that he has not drank any alcohol since his last hospitalization in Oct 2025.  He denies any tobacco or illicit drug use.  He states that he continues to have intermittent epistaxis.  In the ED, the patient had a low-grade temperature of 99.2 F.  He was hemodynamically stable with oxygen saturation 92-94% on room air.  He was placed on 2 L with saturation up to 100%.  CTA chest was negative for PE.  It showed a large right pleural effusion with atelectasis.  There was dilated pulmonary artery up to 3.600 m.  There was a cirrhotic liver with splenomegaly.  The patient was admitted for further evaluation and treatment of his pleural effusion and dyspnea.  The patient had a recent hospitalization from 11/11/2023 to 11/16/2023. During that hospitalization, the patient was seen by GI for decompensated liver cirrhosis with worsening ascites and hepatic hydrothorax.  He was seen by GI who increased his Lasix  to 40 mg twice daily and spironolactone  100 mg daily.  His Coreg  was held in part due to soft blood pressures he was instructed to continue lactulose .  He was seen by pulmonary, and he underwent thoracocentesis of his right pleural effusion removing 2 L of fluid.   He was felt to be consistent with a hepatic hydrothorax.  He was noted to have recurrent epistaxis secondary to his thrombocytopenia.  He was transfused 3 units of platelets and 1 unit PRBC during the hospitalization.  He also had hematuria during his last hospitalization.  Urology was contacted and felt the patient was appropriate for outpatient follow-up as his CT abdomen did not show any ureteral stones or acute GU abnormalities.   Assessment/Plan: Decompensated alcohol liver cirrhosis - Presenting with recurrent right pleural effusion and ascites - Patient endorses compliance with his medications - Continue furosemide  and spironolactone  - Check ammonia - MELD 3. 0 = 11 - Request paracentesis  Recurrent right small effusion - Request thoracocentesis - Sent for cell count and chemistries - Secondary to hepatic hydrothorax  Pancytopenia - Secondary to liver cirrhosis - Check iron  studies - Folic acid  - B12  Fever - UA negative for pyuria - Follow-up blood cultures - Check PCT - 12/29/2023 CTA chest--as discussed above, not definitive for consolidation - Continue ceftriaxone  and azithromycin  for now - MRSA screen - Check COVID/flu/RSV  Microcytic anemia - Check iron  studies         Family Communication:   no Family at bedside  Consultants:  none  Code Status:  FULL   DVT Prophylaxis:  SCDs   Procedures: As Listed in Progress Note Above  Antibiotics: Ceftriaxone  11/28>> Azithro 11/28>>      Total time spent 50 minutes.  Greater than 50% spent face to face counseling and coordinating care.  Subjective: Patient complains of shortness of breath.  He has chest discomfort last night.  He denies any nausea, vomiting, diarrhea, hematochezia, melena.  He has some abdominal discomfort.  There is no dysuria or hematuria.  Objective: Vitals:   12/30/23 0425 12/30/23 0445 12/30/23 0545 12/30/23 0748  BP:  (!) 105/59 120/64 116/70  Pulse: 92 83 69 85  Resp:  (!) 24 (!) 22 (!) 24 (!) 21  Temp:   99 F (37.2 C) 98.6 F (37 C)  TempSrc:    Oral  SpO2: 91% 94% 94% 96%  Weight:      Height:        Intake/Output Summary (Last 24 hours) at 12/30/2023 9178 Last data filed at 12/30/2023 0420 Gross per 24 hour  Intake 351.35 ml  Output --  Net 351.35 ml   Weight change:  Exam:  General:  Pt is alert, follows commands appropriately, not in acute distress HEENT: No icterus, No thrush, No neck mass, [place]/AT Cardiovascular: RRR, S1/S2, no rubs, no gallops Respiratory: Diminished breath sounds on the right.  Bibasilar crackles.  No wheezing Abdomen: Soft/+BS, non tender, non distended, no guarding + fluid wave Extremities: No edema, No lymphangitis, No petechiae, No rashes, no synovitis   Data Reviewed: I have personally reviewed following labs and imaging studies Basic Metabolic Panel: Recent Labs  Lab 12/29/23 2358  NA 137  K 3.6  CL 103  CO2 24  GLUCOSE 114*  BUN 12  CREATININE 0.76  CALCIUM  8.1*   Liver Function Tests: Recent Labs  Lab 12/29/23 2358  AST 86*  ALT 38  ALKPHOS 163*  BILITOT 1.7*  PROT 6.9  ALBUMIN  3.4*   Recent Labs  Lab 12/29/23 2358  LIPASE 63*   No results for input(s): AMMONIA in the last 168 hours. Coagulation Profile: Recent Labs  Lab 12/29/23 2358  INR 1.3*   CBC: Recent Labs  Lab 12/29/23 2358  WBC 2.1*  HGB 7.9*  HCT 24.1*  MCV 92.3  PLT 30*   Cardiac Enzymes: No results for input(s): CKTOTAL, CKMB, CKMBINDEX, TROPONINI in the last 168 hours. BNP: Invalid input(s): POCBNP CBG: No results for input(s): GLUCAP in the last 168 hours. HbA1C: No results for input(s): HGBA1C in the last 72 hours. Urine analysis:    Component Value Date/Time   COLORURINE YELLOW 12/30/2023 0313   APPEARANCEUR CLEAR 12/30/2023 0313   LABSPEC 1.019 12/30/2023 0313   PHURINE 5.0 12/30/2023 0313   GLUCOSEU NEGATIVE 12/30/2023 0313   HGBUR MODERATE (A) 12/30/2023 0313   BILIRUBINUR  NEGATIVE 12/30/2023 0313   BILIRUBINUR Negative 10/21/2016 1642   KETONESUR NEGATIVE 12/30/2023 0313   PROTEINUR 30 (A) 12/30/2023 0313   UROBILINOGEN negative (A) 10/21/2016 1642   NITRITE NEGATIVE 12/30/2023 0313   LEUKOCYTESUR NEGATIVE 12/30/2023 0313   Sepsis Labs: @LABRCNTIP (procalcitonin:4,lacticidven:4) ) Recent Results (from the past 240 hours)  Blood Culture (routine x 2)     Status: None (Preliminary result)   Collection Time: 12/30/23  2:33 AM   Specimen: BLOOD  Result Value Ref Range Status   Specimen Description BLOOD BLOOD RIGHT HAND  Final   Special Requests   Final    BOTTLES DRAWN AEROBIC AND ANAEROBIC Blood Culture adequate volume   Culture   Final    NO GROWTH < 12 HOURS Performed at [facility], [address], [place], [place] [address]    Report Status PENDING  Incomplete  Resp panel by RT-PCR (RSV, Flu A&B, Covid) Anterior Nasal Swab  Status: None   Collection Time: 12/30/23  2:33 AM   Specimen: Anterior Nasal Swab  Result Value Ref Range Status   SARS Coronavirus 2 by RT PCR NEGATIVE NEGATIVE Final    Comment: (NOTE) SARS-CoV-2 target nucleic acids are NOT DETECTED.  The SARS-CoV-2 RNA is generally detectable in upper respiratory specimens during the acute phase of infection. The lowest concentration of SARS-CoV-2 viral copies this assay can detect is 138 copies/mL. A negative result does not preclude SARS-Cov-2 infection and should not be used as the sole basis for treatment or other patient management decisions. A negative result may occur with  improper specimen collection/handling, submission of specimen other than nasopharyngeal swab, presence of viral mutation(s) within the areas targeted by this assay, and inadequate number of viral copies(<138 copies/mL). A negative result must be combined with clinical observations, patient history, and epidemiological information. The expected result is Negative.  Fact Sheet for Patients:   bloggercourse.com  Fact Sheet for Healthcare Providers:  seriousbroker.it  This test is no t yet approved or cleared by the [place]  FDA and  has been authorized for detection and/or diagnosis of SARS-CoV-2 by FDA under an Emergency Use Authorization (EUA). This EUA will remain  in effect (meaning this test can be used) for the duration of the COVID-19 declaration under Section 564(b)(1) of the Act, 21 U.S.C.section 360bbb-3(b)(1), unless the authorization is terminated  or revoked sooner.       Influenza A by PCR NEGATIVE NEGATIVE Final   Influenza B by PCR NEGATIVE NEGATIVE Final    Comment: (NOTE) The Xpert Xpress SARS-CoV-2/FLU/RSV plus assay is intended as an aid in the diagnosis of influenza from Nasopharyngeal swab specimens and should not be used as a sole basis for treatment. Nasal washings and aspirates are unacceptable for Xpert Xpress SARS-CoV-2/FLU/RSV testing.  Fact Sheet for Patients: bloggercourse.com  Fact Sheet for Healthcare Providers: seriousbroker.it  This test is not yet approved or cleared by the [place]  FDA and has been authorized for detection and/or diagnosis of SARS-CoV-2 by FDA under an Emergency Use Authorization (EUA). This EUA will remain in effect (meaning this test can be used) for the duration of the COVID-19 declaration under Section 564(b)(1) of the Act, 21 U.S.C. section 360bbb-3(b)(1), unless the authorization is terminated or revoked.     Resp Syncytial Virus by PCR NEGATIVE NEGATIVE Final    Comment: (NOTE) Fact Sheet for Patients: bloggercourse.com  Fact Sheet for Healthcare Providers: seriousbroker.it  This test is not yet approved or cleared by the [place]  FDA and has been authorized for detection and/or diagnosis of SARS-CoV-2 by FDA under an Emergency Use  Authorization (EUA). This EUA will remain in effect (meaning this test can be used) for the duration of the COVID-19 declaration under Section 564(b)(1) of the Act, 21 U.S.C. section 360bbb-3(b)(1), unless the authorization is terminated or revoked.  Performed at [facility], [address], [place], [place] [address]   Blood Culture (routine x 2)     Status: None (Preliminary result)   Collection Time: 12/30/23  2:35 AM   Specimen: BLOOD  Result Value Ref Range Status   Specimen Description BLOOD BLOOD LEFT HAND  Final   Special Requests   Final    BOTTLES DRAWN AEROBIC AND ANAEROBIC Blood Culture adequate volume   Culture   Final    NO GROWTH < 12 HOURS Performed at [facility], [address] [place], [place] [address]    Report Status PENDING  Incomplete  Scheduled Meds:  sodium chloride    Intravenous Once   dextromethorphan -guaiFENesin   1 tablet Oral BID   furosemide   40 mg Oral BID   lactulose   20 g Oral Daily   spironolactone   100 mg Oral Daily   Continuous Infusions:  azithromycin  Stopped (12/30/23 0420)   cefTRIAXone  (ROCEPHIN )  IV      Procedures/Studies: CT Angio Chest PE W and/or Wo Contrast Result Date: 12/30/2023 EXAM: CTA CHEST 12/30/2023 05:10:24 AM TECHNIQUE: CTA of the chest was performed after the administration of intravenous contrast. Multiplanar reformatted images are provided for review. MIP images are provided for review. Automated exposure control, iterative reconstruction, and/or weight based adjustment of the mA/kV was utilized to reduce the radiation dose to as low as reasonably achievable. COMPARISON: CT of the chest dated 11/11/2023. CLINICAL HISTORY: Pulmonary embolism (PE) suspected, low to intermediate prob, positive D-dimer. FINDINGS: PULMONARY ARTERIES: Pulmonary arteries are adequately opacified for evaluation. No acute pulmonary embolus. Main pulmonary artery is dilated, measuring approximately 3.6 cm in diameter. MEDIASTINUM: The heart  and pericardium demonstrate no acute abnormality. There is no acute abnormality of the thoracic aorta. LYMPH NODES: No mediastinal, hilar or axillary lymphadenopathy. LUNGS AND PLEURA: There is a large right-sided pleural effusion present. There is passive atelectasis to the right lower lobe. No focal consolidation or pulmonary edema. No pneumothorax. UPPER ABDOMEN: The liver appears cirrhotic. The spleen is moderately enlarged. SOFT TISSUES AND BONES: No acute bone or soft tissue abnormality. IMPRESSION: 1. No evidence of pulmonary embolism. 2. Large right-sided pleural effusion with associated passive atelectasis in the right lower lobe. 3. Dilated main pulmonary artery measuring approximately 3.6 cm in diameter, which can be seen with pulmonary hypertension; correlate clinically. 4. Cirrhotic liver and moderately enlarged spleen. Electronically signed by: Evalene Coho MD 12/30/2023 05:29 AM EST RP Workstation: [id number]   DG Chest 2 View Result Date: 12/30/2023 EXAM: 2 VIEW(S) XRAY OF THE CHEST 12/30/2023 12:22:02 AM COMPARISON: Comparison with 11/15/2023. CLINICAL HISTORY: SOB. FINDINGS: LUNGS AND PLEURA: Large right pleural effusion with basilar atelectasis or consolidation. This is decreasing in size since the previous study, although possibly due to differences in patient position. Left lung is clear. No pneumothorax. HEART AND MEDIASTINUM: Heart size is normal. Mediastinal contours appear intact. BONES AND SOFT TISSUES: No acute osseous abnormality. IMPRESSION: 1. Large right pleural effusion with basilar atelectasis or consolidation, decreased compared to prior, though positional factors may contribute. Electronically signed by: Elsie Gravely MD 12/30/2023 12:24 AM EST RP Workstation: [id number]    Alm Schneider, DO  [facility]  If 7PM-7AM, please contact night-coverage [email or web address] Password [email or web address] 12/30/2023, 8:21 AM   LOS: 0 days

## 2023-12-30 NOTE — Progress Notes (Signed)
  Transition of Care [facility]) Screening Note   Patient Details  Name: TIAGO HUMPHREY Date of Birth: October 18, 1980   Transition of Care [facility]) CM/SW Contact:    Hoy DELENA Bigness, LCSW Phone Number: 12/30/2023, 8:46 AM    Transition of Care Department [facility]) has reviewed patient and no TOC needs have been identified at this time. We will continue to monitor patient advancement through interdisciplinary progression rounds. If new patient transition needs arise, please place a TOC consult.    12/30/23 0845  TOC Brief Assessment  Insurance and Status Reviewed  Patient has primary care physician Yes  Home environment has been reviewed From home  Prior level of function: Independent  Prior/Current Home Services No current home services  Social Drivers of Health Review SDOH reviewed no interventions necessary  Readmission risk has been reviewed Yes  Transition of care needs transition of care needs identified, TOC will continue to follow

## 2023-12-30 NOTE — Progress Notes (Signed)
 Patient tolerated right sided Thoracentesis procedure well today and 2.4 Liters of clear yellow pleural fluid removed and labs collected and sent to lab for processing. Patient verbalized understanding of post procedure instructions and transported via wheelchair to xray at this time for post chest xray with no acute distress noted.

## 2023-12-30 NOTE — Plan of Care (Signed)

## 2023-12-30 NOTE — ED Notes (Signed)
 Patient to CT.

## 2023-12-30 NOTE — Progress Notes (Signed)
*  PRELIMINARY RESULTS* Echocardiogram 2D Echocardiogram has been performed.  Teresa Aida PARAS 12/30/2023, 4:47 PM

## 2023-12-30 NOTE — ED Notes (Signed)
 Patient placed on 2 L [place] d/t oxygen saturation consistently staying low.

## 2023-12-30 NOTE — Procedures (Signed)
 PROCEDURE SUMMARY:  Successful US  guided right thoracentesis. Yielded 2.4 liters of clear, yellow fluid. Pt tolerated procedure well. No immediate complications.  Specimen was sent for labs. CXR ordered.  EBL < 5 mL  Solmon Selmer Ku PA-C 12/30/2023 10:10 AM

## 2023-12-30 NOTE — ED Provider Notes (Signed)
 [place] EMERGENCY DEPARTMENT AT [facility] Provider Note   CSN: [id number] Arrival date & time: 12/29/23  2332     Patient presents with: Shortness of Breath   Robert Sellers is a 43 y.o. male.   The history is provided by the patient.   Patient with extensive history including alcoholic cirrhosis, history of esophageal varices presents with shortness of breath.  Patient reports around 10 PM he had onset of shortness of breath.  He reports it feels like he has fluid in his lungs that needs to be drained.  He did have a fever earlier today up to 103.  No cough, no vomiting or diarrhea but he does report body aches.  No active chest pain.  No new abdominal pain.   Past Medical History:  Diagnosis Date   Acute blood loss anemia 10/01/2022   Acute upper GI bleeding 09/29/2022   Anxiety    Ascites    Clotting disorder    Depression    Hyperlipidemia 12/04/2012   Hypertension    Left shoulder pain 08/28/2015   Substance abuse (HCC)     Prior to Admission medications   Medication Sig Start Date End Date Taking? Authorizing Provider  furosemide  (LASIX ) 40 MG tablet Take 1 tablet (40 mg total) by mouth 2 (two) times daily. 11/16/23   Cindy Garnette POUR, MD  lactulose  (CHRONULAC ) 10 GM/15ML solution Take 30 mLs (20 g total) by mouth daily. 11/17/23   Cindy Garnette POUR, MD  spironolactone  (ALDACTONE ) 100 MG tablet Take 1 tablet (100 mg total) by mouth daily. 11/17/23   Cindy Garnette POUR, MD    Allergies: Patient has no known allergies.    Review of Systems  Constitutional:  Positive for fatigue and fever.  Respiratory:  Positive for shortness of breath.   Musculoskeletal:  Positive for myalgias.    Updated Vital Signs BP 120/64   Pulse 69   Temp 99 F (37.2 C)   Resp (!) 24   Ht 1.778 m (5' 10)   Wt 83.5 kg   SpO2 94%   BMI 26.41 kg/m   Physical Exam CONSTITUTIONAL: Chronically ill-appearing, appears older than stated age HEAD: Normocephalic/atraumatic EYES:  EOMI/PERRL ENMT: Mucous membranes moist NECK: supple no meningeal signs CV: S1/S2 noted, no murmurs/rubs/gallops noted LUNGS: Decreased breath sounds in the right base ABDOMEN: soft, protuberant but nontender, no rebound or guarding, bowel sounds noted throughout abdomen NEURO: Pt is awake/alert/appropriate, moves all extremitiesx4.  No facial droop.   EXTREMITIES: pulses normal/equal, full ROM SKIN: warm, color normal PSYCH: no abnormalities of mood noted, alert and oriented to situation  (all labs ordered are listed, but only abnormal results are displayed) Labs Reviewed  BASIC METABOLIC PANEL WITH GFR - Abnormal; Notable for the following components:      Result Value   Glucose, Bld 114 (*)    Calcium  8.1 (*)    All other components within normal limits  CBC - Abnormal; Notable for the following components:   WBC 2.1 (*)    RBC 2.61 (*)    Hemoglobin 7.9 (*)    HCT 24.1 (*)    RDW 21.2 (*)    Platelets 30 (*)    All other components within normal limits  LIPASE, BLOOD - Abnormal; Notable for the following components:   Lipase 63 (*)    All other components within normal limits  PROTIME-INR - Abnormal; Notable for the following components:   Prothrombin Time 17.0 (*)    INR 1.3 (*)  All other components within normal limits  URINALYSIS, W/ REFLEX TO CULTURE (INFECTION SUSPECTED) - Abnormal; Notable for the following components:   Hgb urine dipstick MODERATE (*)    Protein, ur 30 (*)    All other components within normal limits  HEPATIC FUNCTION PANEL - Abnormal; Notable for the following components:   Albumin  3.4 (*)    AST 86 (*)    Alkaline Phosphatase 163 (*)    Total Bilirubin 1.7 (*)    Bilirubin, Direct 1.0 (*)    All other components within normal limits  D-DIMER, QUANTITATIVE - Abnormal; Notable for the following components:   D-Dimer, Quant 3.47 (*)    All other components within normal limits  CULTURE, BLOOD (ROUTINE X 2)  CULTURE, BLOOD (ROUTINE X 2)   RESP PANEL BY RT-PCR (RSV, FLU A&B, COVID)  RVPGX2  LACTIC ACID, PLASMA    EKG: EKG Interpretation Date/Time:  Thursday December 29 2023 23:56:46 EST Ventricular Rate:  97 PR Interval:  152 QRS Duration:  103 QT Interval:  367 QTC Calculation: 467 R Axis:   96  Text Interpretation: Sinus rhythm Anterior infarct, old Interpretation limited secondary to artifact Confirmed by Midge Golas ([id number]) on 12/29/2023 11:58:26 PM  Radiology: CT Angio Chest PE W and/or Wo Contrast Result Date: 12/30/2023 EXAM: CTA CHEST 12/30/2023 05:10:24 AM TECHNIQUE: CTA of the chest was performed after the administration of intravenous contrast. Multiplanar reformatted images are provided for review. MIP images are provided for review. Automated exposure control, iterative reconstruction, and/or weight based adjustment of the mA/kV was utilized to reduce the radiation dose to as low as reasonably achievable. COMPARISON: CT of the chest dated 11/11/2023. CLINICAL HISTORY: Pulmonary embolism (PE) suspected, low to intermediate prob, positive D-dimer. FINDINGS: PULMONARY ARTERIES: Pulmonary arteries are adequately opacified for evaluation. No acute pulmonary embolus. Main pulmonary artery is dilated, measuring approximately 3.6 cm in diameter. MEDIASTINUM: The heart and pericardium demonstrate no acute abnormality. There is no acute abnormality of the thoracic aorta. LYMPH NODES: No mediastinal, hilar or axillary lymphadenopathy. LUNGS AND PLEURA: There is a large right-sided pleural effusion present. There is passive atelectasis to the right lower lobe. No focal consolidation or pulmonary edema. No pneumothorax. UPPER ABDOMEN: The liver appears cirrhotic. The spleen is moderately enlarged. SOFT TISSUES AND BONES: No acute bone or soft tissue abnormality. IMPRESSION: 1. No evidence of pulmonary embolism. 2. Large right-sided pleural effusion with associated passive atelectasis in the right lower lobe. 3. Dilated main  pulmonary artery measuring approximately 3.6 cm in diameter, which can be seen with pulmonary hypertension; correlate clinically. 4. Cirrhotic liver and moderately enlarged spleen. Electronically signed by: Evalene Coho MD 12/30/2023 05:29 AM EST RP Workstation: [id number]   DG Chest 2 View Result Date: 12/30/2023 EXAM: 2 VIEW(S) XRAY OF THE CHEST 12/30/2023 12:22:02 AM COMPARISON: Comparison with 11/15/2023. CLINICAL HISTORY: SOB. FINDINGS: LUNGS AND PLEURA: Large right pleural effusion with basilar atelectasis or consolidation. This is decreasing in size since the previous study, although possibly due to differences in patient position. Left lung is clear. No pneumothorax. HEART AND MEDIASTINUM: Heart size is normal. Mediastinal contours appear intact. BONES AND SOFT TISSUES: No acute osseous abnormality. IMPRESSION: 1. Large right pleural effusion with basilar atelectasis or consolidation, decreased compared to prior, though positional factors may contribute. Electronically signed by: Elsie Gravely MD 12/30/2023 12:24 AM EST RP Workstation: [id number]     .Critical Care  Performed by: Midge Golas, MD Authorized by: Midge Golas, MD   Critical care provider statement:  Critical care time (minutes):  45   Critical care start time:  12/30/2023 5:15 AM   Critical care end time:  12/30/2023 6:00 AM   Critical care time was exclusive of:  Separately billable procedures and treating other patients   Critical care was necessary to treat or prevent imminent or life-threatening deterioration of the following conditions:  Respiratory failure and sepsis   Critical care was time spent personally by me on the following activities:  Obtaining history from patient or surrogate, examination of patient, evaluation of patient's response to treatment, pulse oximetry, ordering and review of radiographic studies, ordering and review of laboratory studies, re-evaluation of patient's condition,  ordering and performing treatments and interventions and review of old charts   I assumed direction of critical care for this patient from another provider in my specialty: no     Care discussed with: admitting provider      Medications Ordered in the ED  azithromycin  (ZITHROMAX ) 500 mg in sodium chloride  0.9 % 250 mL IVPB (0 mg Intravenous Stopped 12/30/23 0420)  cefTRIAXone  (ROCEPHIN ) 1 g in sodium chloride  0.9 % 100 mL IVPB (0 g Intravenous Stopped 12/30/23 0310)  ondansetron  (ZOFRAN ) injection 4 mg (4 mg Intravenous Given 12/30/23 0326)  iohexol  (OMNIPAQUE ) 350 MG/ML injection 75 mL (75 mLs Intravenous Contrast Given 12/30/23 0458)    Clinical Course as of 12/30/23 0606  Fri Dec 30, 2023  0309 Hemoglobin(!): 7.9 Chronic anemia [DW]  0309 Platelets(!): 30 Chronic thrombocytopenia [DW]  0320 Patient with extensive history including cirrhosis, and previous pleural effusion presents with increased shortness of breath.  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  He has an effusion though it appears improved from prior.  He may also have consolidation, he does report fever up to 103 at home. IV antibiotics have been started for possible pneumonia [DW]  [phone] Patient resting comfortably, though still reports shortness of breath and right sided chest tightness Patient is tachypneic and his pulse ox is been in the low to mid 90s Will add on D-dimer but may need CT Chest if ddimer + [DW]  0606 No acute PE on CT chest. He does have recurrent pleural effusion  Patient be admitted for further evaluation and management [DW]  0606 Discussed with Dr. Adefeso for admission [DW]    Clinical Course User Index [DW] Midge Golas, MD                                 Medical Decision Making Amount and/or Complexity of Data Reviewed Labs: ordered. Decision-making details documented in ED Course. Radiology: ordered.  Risk Prescription drug management. Decision  regarding hospitalization.   This patient presents to the ED for concern of shortness of breath, this involves an extensive number of treatment options, and is a complaint that carries with it a high risk of complications and morbidity.  The differential diagnosis includes but is not limited to Acute coronary syndrome, pneumonia, acute pulmonary edema, pneumothorax, acute anemia, pulmonary embolism Pleural effusion  Comorbidities that complicate the patient evaluation: Patient's presentation is complicated by their history of cirrhosis  Social Determinants of Health: Patient's alcohol use disorder  increases the complexity of managing their presentation  Additional history obtained: Records reviewed previous admission documents  Lab Tests: I Ordered, and personally interpreted labs.  The pertinent results include: Chronic thrombocytopenia, chronic anemia  Imaging Studies ordered: I ordered imaging studies including X-ray chest  I independently visualized and interpreted imaging which showed right pleural effusion, consolidation I agree with the radiologist interpretation  Cardiac Monitoring: The patient was maintained on a cardiac monitor.  I personally viewed and interpreted the cardiac monitor which showed an underlying rhythm of:  sinus rhythm  Medicines ordered and prescription drug management: I ordered medication including IV antibiotics for presumed pneumonia Reevaluation of the patient after these medicines showed that the patient    stayed the same   Critical Interventions:   IV antibiotics, admission  Consultations Obtained: I requested consultation with the admitting physician [facility], and discussed  findings as well as pertinent plan - they recommend: Will admit  Reevaluation: After the interventions noted above, I reevaluated the patient and found that they have :improved  Complexity of problems addressed: Patient's presentation is most consistent with  acute  presentation with potential threat to life or bodily function  Disposition: After consideration of the diagnostic results and the patient's response to treatment,  I feel that the patent would benefit from admission  .        Final diagnoses:  Pleural effusion    ED Discharge Orders     None          Midge Golas, MD 12/30/23 [phone]

## 2023-12-30 NOTE — H&P (Signed)
 History and Physical    Patient: Robert Sellers FMW:[id number] DOB: 09/17/1980 DOA: 12/29/2023 DOS: the patient was seen and examined on 12/30/2023 PCP: Vincente Shivers, NP  Patient coming from: Home  Chief Complaint:  Chief Complaint  Patient presents with   Shortness of Breath   HPI: Robert Sellers is a 43 y.o. male with medical history significant of alcoholic cirrhosis complicated by hypotension, esophageal varices, ascites who presents to the emergency department due to shortness of breath which started around 10 PM last night, he states that it feels like he has some fluid in his lungs that needed to be drained and endorsed being febrile with a temperature of 103F and that he took Tylenol  prior to arrival to the ED.  He endorsed body aches, but denies chest pain, abdominal pain, cough, vomiting.  ED course In the emergency department, he was tachypneic, tachycardic, but otherwise vital signs were within normal range.  Workup in the ED showed pancytopenia and normal BMP except for blood glucose of 114.  AST 86, ALT 38, ALP 163, [place] 1.7, lipase 63, lactic acid 1.6, urinalysis was normal, D-dimer was 3.47.  Influenza A, B, SARS, RSV, RSV was negative.  Blood culture was normal. CT angiography of chest with and without contrast showed no evidence of pulmonary embolism.  Large right-sided pleural effusion with associated passive atelectasis in the right lower lobe. Dilated main pulmonary artery measuring approximately 3.6 cm in diameter, which can be seen with pulmonary hypertension; correlate clinically. Cirrhotic liver and moderately enlarged spleen. Empiric ceftriaxone  and azithromycin  due to presumed community-acquired pneumonia was given, IV Zofran  was also given. Patient was admitted from 10/10 to 10/15 at [facility] due to epistaxis, hematuria in the setting of thrombocytopenia and right-sided pleural effusion s/p thoracentesis on 10/12 with removal of 2 L of yellow fluid by Dr.  Zaida    Review of Systems: As mentioned in the history of present illness. All other systems reviewed and are negative. Past Medical History:  Diagnosis Date   Acute blood loss anemia 10/01/2022   Acute upper GI bleeding 09/29/2022   Anxiety    Ascites    Clotting disorder    Depression    Hyperlipidemia 12/04/2012   Hypertension    Left shoulder pain 08/28/2015   Substance abuse [facility])    Past Surgical History:  Procedure Laterality Date   ESOPHAGOGASTRODUODENOSCOPY N/A 04/12/2023   Procedure: EGD (ESOPHAGOGASTRODUODENOSCOPY);  Surgeon: Jinny Carmine, MD;  Location: [facility] ENDOSCOPY;  Service: Endoscopy;  Laterality: N/A;   ESOPHAGOGASTRODUODENOSCOPY N/A 05/13/2023   Procedure: EGD (ESOPHAGOGASTRODUODENOSCOPY);  Surgeon: Onita Elspeth Sharper, DO;  Location: [facility] ENDOSCOPY;  Service: Gastroenterology;  Laterality: N/A;   ESOPHAGOGASTRODUODENOSCOPY N/A 06/03/2023   Procedure: EGD (ESOPHAGOGASTRODUODENOSCOPY);  Surgeon: Onita Elspeth Sharper, DO;  Location: [facility] ENDOSCOPY;  Service: Gastroenterology;  Laterality: N/A;   ESOPHAGOGASTRODUODENOSCOPY (EGD) WITH PROPOFOL  N/A 09/30/2022   Procedure: ESOPHAGOGASTRODUODENOSCOPY (EGD) WITH PROPOFOL ;  Surgeon: Toledo, Ladell POUR, MD;  Location: [facility] ENDOSCOPY;  Service: Gastroenterology;  Laterality: N/A;   GASTRIC VARICES BANDING  04/12/2023   Procedure: BAND LIGATION, GASTRIC VARICES;  Surgeon: Jinny Carmine, MD;  Location: [facility] ENDOSCOPY;  Service: Endoscopy;;   KNEE SURGERY     left shoulder surgery Left 2024   [facility]   Social History:  reports that he has never smoked. He has never used smokeless tobacco. He reports that he does not currently use alcohol after a past usage of about 1.0 standard drink of alcohol per week. He reports that he does not  use drugs.  No Known Allergies  Family History  Problem Relation Age of Onset   Heart disease Mother    Hyperlipidemia Mother    Hypertension Mother    Diabetes Mother    Early death Mother     Obesity Mother    Heart disease Father    Hyperlipidemia Father    Hypertension Father    Diabetes Father    Alcohol abuse Father    Early death Father    Kidney disease Father    Obesity Father     Prior to Admission medications   Medication Sig Start Date End Date Taking? Authorizing Provider  furosemide  (LASIX ) 40 MG tablet Take 1 tablet (40 mg total) by mouth 2 (two) times daily. 11/16/23   Cindy Garnette POUR, MD  lactulose  (CHRONULAC ) 10 GM/15ML solution Take 30 mLs (20 g total) by mouth daily. 11/17/23   Cindy Garnette POUR, MD  spironolactone  (ALDACTONE ) 100 MG tablet Take 1 tablet (100 mg total) by mouth daily. 11/17/23   Cindy Garnette POUR, MD    Physical Exam: Vitals:   12/30/23 0420 12/30/23 0425 12/30/23 0445 12/30/23 0545  BP: 112/65  (!) 105/59 120/64  Pulse: 82 92 83 69  Resp: (!) 22 (!) 24 (!) 22 (!) 24  Temp:    99 F (37.2 C)  TempSrc:      SpO2: 94% 91% 94% 94%  Weight:      Height:       General: Chronically ill-appearing.  Awake and alert and oriented x3. Not in any acute distress.  HEENT: NCAT.  PERRLA. EOMI. Sclerae anicteric.  Moist mucosal membranes. Neck: Neck supple without lymphadenopathy. No carotid bruits. No masses palpated.  Cardiovascular: Regular rate with normal S1-S2 sounds. No murmurs, rubs or gallops auscultated. No JVD.  Respiratory: Clear breath sounds.  No accessory muscle use. Abdomen: Soft, nontender, mildly distended. Active bowel sounds. No masses or hepatosplenomegaly  Skin: No rashes, lesions, or ulcerations.  Dry, warm to touch. Musculoskeletal:  2+ dorsalis pedis and radial pulses. Good ROM.  No contractures  Psychiatric: Intact judgment and insight.  Mood appropriate to current condition. Neurologic: No focal neurological deficits. Strength is 5/5 x 4.  CN II - XII grossly intact.  Assessment and Plan: Recurrent right pleural effusion This may be due to hepatic hydrothorax Patient will need thoracentesis, however, thrombocytopenia  (chronic) will need to be addressed first 1 unit of platelets will be transfused  Presumed CAP POA Patient was empirically started on ceftriaxone  and azithromycin  in the ED, we shall continue same at this time with plan to de-escalate/discontinue based on blood culture, sputum culture, urine Legionella, strep pneumo and procalcitonin Continue Tylenol  as needed Continue Mucinex , incentive spirometry, flutter valve   Pancytopenia This is due to patient has hepatic cirrhosis Continue to monitor CBC with morning labs  Elevated D-dimer D-dimer 3.47, CT angiogram of chest ruled out PE  Transaminitis/elevated lipase level These are possibly due to patient's history of alcoholic cirrhosis Continue to monitor liver enzymes  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 Inpatient status is judged to be reasonable and necessary in order to provide the required intensity of service to ensure the patient's safety. The patient's presenting symptoms, physical exam findings, and initial radiographic and laboratory data in the context of their chronic comorbidities  is felt to place them at high risk for further clinical deterioration. Furthermore, it is not anticipated that the patient will be medically stable for discharge from the hospital within 2 midnights of admission.   * I certify that at the point of admission it is my clinical judgment that the patient will require inpatient hospital care spanning beyond 2 midnights from the point of admission due to high intensity of service, high risk for further deterioration and high frequency of surveillance required.*  Author: Kristel Durkee, DO 12/30/2023 7:07 AM  For on call review [email or web address].

## 2023-12-30 NOTE — Hospital Course (Addendum)
 43 year old male with a history of alcoholic liver cirrhosis, hepatic hydrothorax, portal hypertension with esophageal varices, alcohol abuse presenting with shortness of breath for about a week.  He states that worsened significantly on the evening of 12/29/2023.  He endorsed a fever of 103.0 F at home on 12/29/2023.  He denies headache, coughing, hemoptysis, nausea, vomiting, diarrhea, abdominal pain.  He did have some chest discomfort substernal yesterday.  He denies any worsening lower extremity edema.  He does feel that his abdomen is little bit distended.  He endorses compliance with all his medications.  He states that he has not drank any alcohol since his last hospitalization in Oct 2025.  He denies any tobacco or illicit drug use.  He states that he continues to have intermittent epistaxis.  In the ED, the patient had a low-grade temperature of 99.2 F.  He was hemodynamically stable with oxygen saturation 92-94% on room air.  He was placed on 2 L with saturation up to 100%.  CTA chest was negative for PE.  It showed a large right pleural effusion with atelectasis.  There was dilated pulmonary artery up to 3.600 m.  There was a cirrhotic liver with splenomegaly.  The patient was admitted for further evaluation and treatment of his pleural effusion and dyspnea.  The patient had a recent hospitalization from 11/11/2023 to 11/16/2023. During that hospitalization, the patient was seen by GI for decompensated liver cirrhosis with worsening ascites and hepatic hydrothorax.  He was seen by GI who increased his Lasix  to 40 mg twice daily and spironolactone  100 mg daily.  His Coreg  was held in part due to soft blood pressures he was instructed to continue lactulose .  He was seen by pulmonary, and he underwent thoracocentesis of his right pleural effusion removing 2 L of fluid.  He was felt to be consistent with a hepatic hydrothorax.  He was noted to have recurrent epistaxis secondary to his thrombocytopenia.   He was transfused 3 units of platelets and 1 unit PRBC during the hospitalization.  He also had hematuria during his last hospitalization.  Urology was contacted and felt the patient was appropriate for outpatient follow-up as his CT abdomen did not show any ureteral stones or acute GU abnormalities.  The patient underwent thoracocentesis on 1128 with 2.4 L removed.  His respiratory status improved.  Unfortunately, his pleural effusion reaccumulated in a matter of 48 hours.  He underwent repeat thoracocentesis on 01/02/2024 removing 1.4 L.  He remained stable on room air.  He was ambulated on room air without desaturation at the time of discharge.  GI was consulted.  They felt the patient would benefit from a TIPS procedure.  They do not recommend any endoscopy at this time.  Pulmonary medicine was consulted for his recurrent hepatic hydrothorax.  They also recommended the patient pursue TIPS procedure.  [facility] was contacted.  I spoke with hepatology, Dr. Maree.  He felt that the patient would be appropriate for TIPS procedure and would except a transfer; however, there were no beds available and the waiting list was not an option.  As result, the patient underwent repeat thoracocentesis on 01/02/2024.  He remained stable on room air and was discharged to follow-up with his hepatologist at [facility].

## 2023-12-31 DIAGNOSIS — D649 Anemia, unspecified: Secondary | ICD-10-CM | POA: Diagnosis not present

## 2023-12-31 DIAGNOSIS — K7290 Hepatic failure, unspecified without coma: Secondary | ICD-10-CM | POA: Diagnosis not present

## 2023-12-31 DIAGNOSIS — J90 Pleural effusion, not elsewhere classified: Secondary | ICD-10-CM | POA: Diagnosis not present

## 2023-12-31 DIAGNOSIS — D696 Thrombocytopenia, unspecified: Secondary | ICD-10-CM | POA: Diagnosis not present

## 2023-12-31 DIAGNOSIS — K7460 Unspecified cirrhosis of liver: Secondary | ICD-10-CM | POA: Diagnosis not present

## 2023-12-31 LAB — CBC
HCT: 21.4 % — ABNORMAL LOW (ref 39.0–52.0)
HCT: 22.3 % — ABNORMAL LOW (ref 39.0–52.0)
Hemoglobin: 7 g/dL — ABNORMAL LOW (ref 13.0–17.0)
Hemoglobin: 7.4 g/dL — ABNORMAL LOW (ref 13.0–17.0)
MCH: 30.2 pg (ref 26.0–34.0)
MCH: 30.6 pg (ref 26.0–34.0)
MCHC: 32.7 g/dL (ref 30.0–36.0)
MCHC: 33.2 g/dL (ref 30.0–36.0)
MCV: 92.2 fL (ref 80.0–100.0)
MCV: 92.1 fL (ref 80.0–100.0)
Platelets: 18 K/uL — CL (ref 150–400)
Platelets: 22 K/uL — CL (ref 150–400)
RBC: 2.32 MIL/uL — ABNORMAL LOW (ref 4.22–5.81)
RBC: 2.42 MIL/uL — ABNORMAL LOW (ref 4.22–5.81)
RDW: 18.9 % — ABNORMAL HIGH (ref 11.5–15.5)
RDW: 18.4 % — ABNORMAL HIGH (ref 11.5–15.5)
WBC: 1.2 K/uL — CL (ref 4.0–10.5)
WBC: 1.3 K/uL — CL (ref 4.0–10.5)
nRBC: 0 % (ref 0.0–0.2)
nRBC: 0 % (ref 0.0–0.2)

## 2023-12-31 LAB — PREPARE RBC (CROSSMATCH)

## 2023-12-31 LAB — BASIC METABOLIC PANEL WITH GFR
Anion gap: 8 (ref 5–15)
BUN: 9 mg/dL (ref 6–20)
CO2: 29 mmol/L (ref 22–32)
Calcium: 7.6 mg/dL — ABNORMAL LOW (ref 8.9–10.3)
Chloride: 98 mmol/L (ref 98–111)
Creatinine, Ser: 0.67 mg/dL (ref 0.61–1.24)
GFR, Estimated: >60 mL/min (ref >60)
Glucose, Bld: 86 mg/dL (ref 70–99)
Potassium: 3.3 mmol/L — ABNORMAL LOW (ref 3.5–5.1)
Sodium: 134 mmol/L — ABNORMAL LOW (ref 135–145)

## 2023-12-31 LAB — HEPATIC FUNCTION PANEL
ALT: 29 U/L (ref 0–44)
AST: 61 U/L — ABNORMAL HIGH (ref 15–41)
Albumin: 2.9 g/dL — ABNORMAL LOW (ref 3.5–5.0)
Alkaline Phosphatase: 90 U/L (ref 38–126)
Bilirubin, Direct: 1.6 mg/dL — ABNORMAL HIGH (ref 0.0–0.2)
Indirect Bilirubin: 1.4 mg/dL — ABNORMAL HIGH (ref 0.3–0.9)
Total Bilirubin: 3 mg/dL — ABNORMAL HIGH (ref 0.0–1.2)
Total Protein: 6 g/dL — ABNORMAL LOW (ref 6.5–8.1)

## 2023-12-31 LAB — ECHOCARDIOGRAM COMPLETE
AR max vel: 4.19 cm2
AV Area VTI: 3.95 cm2
AV Area mean vel: 3.36 cm2
AV Mean grad: 11 mmHg
AV Peak grad: 21 mmHg
Ao pk vel: 2.29 m/s
Area-P 1/2: 2.56 cm2
Height: 70 in
S' Lateral: 2.4 cm
Weight: 2945.35 [oz_av]

## 2023-12-31 LAB — URINE CULTURE: Culture: NO GROWTH

## 2023-12-31 LAB — PREPARE PLATELET PHERESIS: Unit division: 0

## 2023-12-31 LAB — MAGNESIUM: Magnesium: 1.7 mg/dL (ref 1.7–2.4)

## 2023-12-31 LAB — BPAM PLATELET PHERESIS
Blood Product Expiration Date: 202511282359
ISSUE DATE / TIME: 202511281031
Unit Type and Rh: 6200

## 2023-12-31 LAB — PROTIME-INR
INR: 1.5 — ABNORMAL HIGH (ref 0.8–1.2)
Prothrombin Time: 18.7 s — ABNORMAL HIGH (ref 11.4–15.2)

## 2023-12-31 MED ORDER — MAGNESIUM SULFATE 2 GM/50ML IV SOLN
2.0000 g | Freq: Once | INTRAVENOUS | Status: AC
  Administered 2023-12-31: 2 g via INTRAVENOUS
  Filled 2023-12-31: qty 50

## 2023-12-31 MED ORDER — SPIRONOLACTONE 25 MG PO TABS
150.0000 mg | ORAL_TABLET | Freq: Every day | ORAL | Status: DC
  Administered 2024-01-01: 150 mg via ORAL
  Filled 2023-12-31: qty 2

## 2023-12-31 MED ORDER — POTASSIUM CHLORIDE 10 MEQ/100ML IV SOLN
10.0000 meq | INTRAVENOUS | Status: AC
  Administered 2023-12-31 (×2): 10 meq via INTRAVENOUS
  Filled 2023-12-31 (×2): qty 100

## 2023-12-31 MED ORDER — ALBUMIN HUMAN 25 % IV SOLN
50.0000 g | Freq: Three times a day (TID) | INTRAVENOUS | Status: AC
  Administered 2023-12-31 – 2024-01-01 (×3): 50 g via INTRAVENOUS
  Filled 2023-12-31 (×3): qty 200

## 2023-12-31 MED ORDER — POTASSIUM CHLORIDE CRYS ER 20 MEQ PO TBCR
20.0000 meq | EXTENDED_RELEASE_TABLET | Freq: Once | ORAL | Status: AC
  Administered 2023-12-31: 20 meq via ORAL
  Filled 2023-12-31: qty 1

## 2023-12-31 MED ORDER — POTASSIUM CHLORIDE 20 MEQ PO PACK: 40.0000 meq | PACK | Freq: Once | ORAL | Status: DC

## 2023-12-31 MED ORDER — SODIUM CHLORIDE 0.9% IV SOLUTION: Freq: Once | INTRAVENOUS | Status: AC

## 2023-12-31 MED ORDER — IRON SUCROSE 200 MG IVPB - SIMPLE MED
200.0000 mg | Freq: Once | Status: DC
  Filled 2023-12-31: qty 110

## 2023-12-31 NOTE — Consult Note (Signed)
 Robert Sellers, M.D. Gastroenterology & Hepatology                                           Patient Name: Robert Sellers Account #: @[email or web address] @   MRN: [id number] Admission Date: 12/29/2023 Date of Evaluation:  12/31/2023 Time of Evaluation: 8:54 AM   Referring Physician: Alm Schneider, MD  Chief Complaint:  Anemia  HPI:  This is a 43 y.o. 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  Gastroenterology was consulted for evaluation of anemia.  Patient reports that on 1 a week before admission he presented significant chest discomfort and was presenting fever up to 103 6 Fahrenheit.  Had also presented some epigastric abdominal pain without significant abdominal distention.  Patient was admitted to the hospital after presenting some desaturation and with CT of the chest PE protocol showing dilated pulmonary artery with recurrent right large pleural effusion requiring thoracentesis with removal of 2.4 L of fluid.  There was presence of splenomegaly as well.  Fluid was consistent with a transudative fluid.  Ultrasound showed presence of some perihepatic ascites but not enough fluid to drain with paracentesis.  Patient was started in the ER on antibiotics for sepsis and has not been febrile since then.  Reported significant improvement of chest discomfort since then.    Patient has been followed at [facility] and more recently by [facility] for possible liver transplant evaluation.  He has been seen by Dr. Fae (May 2025) who considered he had to be abstinent before proceeding with further evaluation for liver transplant.  Patient reports that his last reading was in October 2025 after recent admission at [facility] for recurrent  hepatic hydrothorax that required diuretic management given severity of thrombocytopenia.  He reported he has undergone thoracentesis in the past although I cannot find this in medical chart.  After last hospitalization, the patient was discharged on furosemide  40 mg twice daily and spironolactone  100 mg daily.  Gastroenterology was consulted for evaluation of anemia.Today the patient had drop in cell counts as the white blood cell was 1.2, platelets 18 and hemoglobin was 7.0, prior levels yesterday showed WBC of 2.1, platelets of 30,000 and hemoglobin of 7.9.  Currently receiving 1 unit PRBC.  Most recent BUN was 9 with creatinine 0.67.  MELD 3.0 today was 17.  Most recent iron  stores from 12/30/2023 showed borderline saturation of 16% with normal ferritin of 40 and iron  of 60.  Notably, the patient denies having any melena or hematochezia, nausea or vomiting.  He has not presented any fever or chills.  No presence of changes in mental status recently.  He has not been taking any NSAIDs or anticoagulants.  Patient has previously been cared for in [place] by [facility]/Dr. Onita, and had been hospitalized at [facility] in the spring 2025 with GI bleeding. He is known to have esophageal varices and portal gastropathy. He had undergone banding x 5 in March 2025, then had repeat banding April 2025 and on EGD May 2025 per Dr. Onita for follow-up was noted to have mild portal gastropathy, grade 2 varices in the lower one third of the esophagus, no further bands were placed .  Past  Medical History: SEE CHRONIC ISSSUES: Past Medical History:  Diagnosis Date   Acute blood loss anemia 10/01/2022   Acute upper GI bleeding 09/29/2022   Anxiety    Ascites    Clotting disorder    Depression    Hyperlipidemia 12/04/2012   Hypertension    Left shoulder pain 08/28/2015   Substance abuse [facility])    Past Surgical History:  Past Surgical History:  Procedure Laterality Date   ESOPHAGOGASTRODUODENOSCOPY N/A  04/12/2023   Procedure: EGD (ESOPHAGOGASTRODUODENOSCOPY);  Surgeon: Jinny Carmine, MD;  Location: [facility] ENDOSCOPY;  Service: Endoscopy;  Laterality: N/A;   ESOPHAGOGASTRODUODENOSCOPY N/A 05/13/2023   Procedure: EGD (ESOPHAGOGASTRODUODENOSCOPY);  Surgeon: Onita Elspeth Sharper, DO;  Location: [facility] ENDOSCOPY;  Service: Gastroenterology;  Laterality: N/A;   ESOPHAGOGASTRODUODENOSCOPY N/A 06/03/2023   Procedure: EGD (ESOPHAGOGASTRODUODENOSCOPY);  Surgeon: Onita Elspeth Sharper, DO;  Location: [facility] ENDOSCOPY;  Service: Gastroenterology;  Laterality: N/A;   ESOPHAGOGASTRODUODENOSCOPY (EGD) WITH PROPOFOL  N/A 09/30/2022   Procedure: ESOPHAGOGASTRODUODENOSCOPY (EGD) WITH PROPOFOL ;  Surgeon: Toledo, Ladell POUR, MD;  Location: [facility] ENDOSCOPY;  Service: Gastroenterology;  Laterality: N/A;   GASTRIC VARICES BANDING  04/12/2023   Procedure: BAND LIGATION, GASTRIC VARICES;  Surgeon: Jinny Carmine, MD;  Location: [facility] ENDOSCOPY;  Service: Endoscopy;;   KNEE SURGERY     left shoulder surgery Left 2024   [facility]   Family History:  Family History  Problem Relation Age of Onset   Heart disease Mother    Hyperlipidemia Mother    Hypertension Mother    Diabetes Mother    Early death Mother    Obesity Mother    Heart disease Father    Hyperlipidemia Father    Hypertension Father    Diabetes Father    Alcohol abuse Father    Early death Father    Kidney disease Father    Obesity Father    Social History:  Social History   Tobacco Use   Smoking status: Never   Smokeless tobacco: Never  Vaping Use   Vaping status: Never Used  Substance Use Topics   Alcohol use: Not Currently    Alcohol/week: 1.0 standard drink of alcohol    Types: 1 Standard drinks or equivalent per week    Comment: consistent   Drug use: No    Home Medications:  Prior to Admission medications   Medication Sig Start Date End Date Taking? Authorizing Provider  carvedilol  (COREG ) 3.125 MG tablet Take 3.125 mg by mouth 2 (two) times daily  with a meal.   Yes [provider]  furosemide  (LASIX ) 40 MG tablet Take 1 tablet (40 mg total) by mouth 2 (two) times daily. 11/16/23  Yes Cindy Garnette POUR, MD  lactulose  (CHRONULAC ) 10 GM/15ML solution Take 30 mLs (20 g total) by mouth daily. Patient taking differently: Take 20 g by mouth 2 (two) times daily as needed for moderate constipation. 11/17/23  Yes Cindy Garnette POUR, MD  Multiple Vitamin (MULTIVITAMIN PO) Take 1 tablet by mouth daily.   Yes [provider]  naltrexone  (DEPADE) 50 MG tablet Take 50 mg by mouth daily.   Yes [provider]  rifaximin  (XIFAXAN ) 550 MG TABS tablet Take 550 mg by mouth 2 (two) times daily.   Yes [provider]  spironolactone  (ALDACTONE ) 100 MG tablet Take 1 tablet (100 mg total) by mouth daily. 11/17/23  Yes Cindy Garnette POUR, MD    Inpatient Medications:  Current Facility-Administered Medications:    0.9 %  sodium chloride  infusion (Manually program via Guardrails IV Fluids), , Intravenous,  Once, Tat, Hance, MD   azithromycin  (ZITHROMAX ) 500 mg in sodium chloride  0.9 % 250 mL IVPB, 500 mg, Intravenous, Q24H, Midge Golas, MD, Last Rate: 250 mL/hr at 12/31/23 0209, 500 mg at 12/31/23 0209   cefTRIAXone  (ROCEPHIN ) 2 g in sodium chloride  0.9 % 100 mL IVPB, 2 g, Intravenous, Q24H, Adefeso, Oladapo, DO, Last Rate: 200 mL/hr at 12/31/23 0741, 2 g at 12/31/23 0741   dextromethorphan -guaiFENesin  (MUCINEX  DM) 30-600 MG per 12 hr tablet 1 tablet, 1 tablet, Oral, BID, Adefeso, Oladapo, DO, 1 tablet at 12/30/23 2143   furosemide  (LASIX ) tablet 40 mg, 40 mg, Oral, BID, Adefeso, Oladapo, DO, 40 mg at 12/31/23 0754   lactated ringers  infusion, , Intravenous, Continuous, Tat, Kenneith, MD, Last Rate: 50 mL/hr at 12/30/23 1308, New Bag at 12/30/23 1308   lactulose  (CHRONULAC ) 10 GM/15ML solution 20 g, 20 g, Oral, Daily, Adefeso, Oladapo, DO, 20 g at 12/30/23 1301   ondansetron  (ZOFRAN ) tablet 4 mg, 4 mg, Oral, Q6H PRN **OR** ondansetron   (ZOFRAN ) injection 4 mg, 4 mg, Intravenous, Q6H PRN, Adefeso, Oladapo, DO, 4 mg at 12/30/23 1215   oxyCODONE  (Oxy IR/ROXICODONE ) immediate release tablet 5 mg, 5 mg, Oral, Q6H PRN, Tat, Jianni, MD, 5 mg at 12/30/23 2143   pantoprazole  (PROTONIX ) injection 40 mg, 40 mg, Intravenous, Q12H, Tat, Alm, MD, 40 mg at 12/30/23 2144   potassium chloride  10 mEq in 100 mL IVPB, 10 mEq, Intravenous, Q1 Hr x 2, Tat, Aylen, MD   spironolactone  (ALDACTONE ) tablet 100 mg, 100 mg, Oral, Daily, Adefeso, Oladapo, DO, 100 mg at 12/30/23 1302 Allergies: Patient has no known allergies.  Complete Review of Systems: GENERAL: negative for malaise, night sweats HEENT: No changes in hearing or vision, no nose bleeds or other nasal problems. NECK: Negative for lumps, goiter, pain and significant neck swelling RESPIRATORY: Negative for cough, wheezing CARDIOVASCULAR: Negative for chest pain, leg swelling, palpitations, orthopnea GI: SEE HPI MUSCULOSKELETAL: Negative for joint pain or swelling, back pain, and muscle pain. SKIN: Negative for lesions, rash PSYCH: Negative for sleep disturbance, mood disorder and recent psychosocial stressors. HEMATOLOGY Negative for prolonged bleeding, bruising easily, and swollen nodes. ENDOCRINE: Negative for cold or heat intolerance, polyuria, polydipsia and goiter. NEURO: negative for tremor, gait imbalance, syncope and seizures. The remainder of the review of systems is noncontributory.  Physical Exam: BP 129/74 (BP Location: Right Arm)   Pulse 80   Temp 98.2 F (36.8 C) (Oral)   Resp 18   Ht 5' 10 (1.778 m)   Wt 83.5 kg   SpO2 97% Comment: pt 87% on room air  BMI 26.41 kg/m  GENERAL: The patient is AO x3, in no acute distress. On [place]. Looks frail. HEENT: Head is normocephalic and atraumatic. EOMI are intact. Mouth is well hydrated and without lesions. NECK: Supple. No masses LUNGS: Clear to auscultation. No presence of rhonchi/wheezing/rales. Adequate chest  expansion HEART: RRR, normal s1 and s2. ABDOMEN: Soft, nontender, no guarding, no peritoneal signs, and nondistended. BS +. No masses. EXTREMITIES: Without any cyanosis, clubbing, rash, lesions or edema. NEUROLOGIC: AOx3, no focal motor deficit. SKIN: no jaundice, no rashes  Laboratory Data CBC:     Component Value Date/Time   WBC 1.2 (LL) 12/31/2023 0232   RBC 2.32 (L) 12/31/2023 0232   HGB 7.0 (L) 12/31/2023 0232   HCT 21.4 (L) 12/31/2023 0232   PLT 18 (LL) 12/31/2023 0232   MCV 92.2 12/31/2023 0232   MCH 30.2 12/31/2023 0232   MCHC 32.7 12/31/2023 0232  RDW 18.9 (H) 12/31/2023 0232   LYMPHSABS 0.5 (L) 11/16/2023 0151   MONOABS 0.5 11/16/2023 0151   EOSABS 0.0 11/16/2023 0151   BASOSABS 0.0 11/16/2023 0151   COAG:  Lab Results  Component Value Date   INR 1.5 (H) 12/31/2023   INR 1.3 (H) 12/29/2023   INR 1.8 (H) 11/16/2023    BMP:     Latest Ref Rng & Units 12/31/2023    2:32 AM 12/29/2023   11:58 PM 11/16/2023    1:51 AM  BMP  Glucose 70 - 99 mg/dL 86  885  891   BUN 6 - 20 mg/dL 9  12  7    Creatinine 0.61 - 1.24 mg/dL 9.32  9.23  9.17   Sodium 135 - 145 mmol/L 134  137  132   Potassium 3.5 - 5.1 mmol/L 3.3  3.6  4.0   Chloride 98 - 111 mmol/L 98  103  99   CO2 22 - 32 mmol/L 29  24  25    Calcium  8.9 - 10.3 mg/dL 7.6  8.1  7.8     HEPATIC:     Latest Ref Rng & Units 12/31/2023    7:29 AM 12/29/2023   11:58 PM 11/14/2023    4:45 AM  Hepatic Function  Total Protein 6.5 - 8.1 g/dL 6.0  6.9  6.3   Albumin  3.5 - 5.0 g/dL 2.9  3.4  1.8   AST 15 - 41 U/L 61  86  48   ALT 0 - 44 U/L 29  38  18   Alk Phosphatase 38 - 126 U/L 90  163  93   Total Bilirubin 0.0 - 1.2 mg/dL 3.0  1.7  2.5   Bilirubin, Direct 0.0 - 0.2 mg/dL 1.6  1.0  0.9     CARDIAC:  Lab Results  Component Value Date   TROPONINI <0.03 03/02/2018     Imaging: I personally reviewed and interpreted the available imaging.  Assessment & Plan: Robert Sellers is a  43 y.o. 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  Gastroenterology was consulted for evaluation of anemia.  Patient had progressive drop in hemoglobin today along with increasing other cell lines.  He has not presented any overt gastrointestinal bleeding symptoms.  Iron  stores during this admission showed borderline saturation but otherwise rest of iron  stores are normal.  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  Reassuringly, he has not had any overt gastrointestinal bleeding and this does not appears to be related to esophageal varices.  Given degree of thrombocytopenia, it would not be advisable to proceed with any endoscopic evaluation at the moment but rather with serial monitoring of his hemoglobin and he would benefit from IV iron  supplementation during the current admission.  If pancytopenia persists, may consider consulting hematology.  In terms of his decompensated liver disease, he is coming with recurrent pleural effusion/hepatic hydrothorax.  Will need to make sure that this has improved after thoracentesis with chest x-ray tomorrow.  At this point, given stable creatinine, will increase his diuretics accordingly.  He is currently following with liver transplant hematology  at [facility] and is already undergoing discussion for possible TIPS, which he can potentially undergo if hepatic hydrothorax recurs.  I emphasized the importance of complete alcohol and marijuana cessation which he understands.  - Daily MELD labs - Furosemide  40 mg BID - Spironolactone   150 mg daily - Daily CBC, keep Hb >7.0 - Active type and screen - Pantoprazole  40 mg qday - If persistent pancytopenia consider hematology consult - evaluate possible splenic embolization - Close outpatient follow up with [facility] clinic and discussion regarding TIPS - Low salt diet - Complete and marijuana alcohol cessation  Robert Fortune, MD Gastroenterology and Hepatology [facility] Gastroenterology

## 2023-12-31 NOTE — Progress Notes (Addendum)
 PROGRESS NOTE  Robert Sellers FMW:[id number] DOB: Dec 08, 1980 DOA: 12/29/2023 PCP: Vincente Shivers, NP  Brief History:  43 year old male with a history of alcoholic liver cirrhosis, hepatic hydrothorax, portal hypertension with esophageal varices, alcohol abuse presenting with shortness of breath for about a week.  He states that worsened significantly on the evening of 12/29/2023.  He endorsed a fever of 103.0 F at home on 12/29/2023.  He denies headache, coughing, hemoptysis, nausea, vomiting, diarrhea, abdominal pain.  He did have some chest discomfort substernal yesterday.  He denies any worsening lower extremity edema.  He does feel that his abdomen is little bit distended.  He endorses compliance with all his medications.  He states that he has not drank any alcohol since his last hospitalization in Oct 2025.  He denies any tobacco or illicit drug use.  He states that he continues to have intermittent epistaxis.  In the ED, the patient had a low-grade temperature of 99.2 F.  He was hemodynamically stable with oxygen saturation 92-94% on room air.  He was placed on 2 L with saturation up to 100%.  CTA chest was negative for PE.  It showed a large right pleural effusion with atelectasis.  There was dilated pulmonary artery up to 3.600 m.  There was a cirrhotic liver with splenomegaly.  The patient was admitted for further evaluation and treatment of his pleural effusion and dyspnea.  The patient had a recent hospitalization from 11/11/2023 to 11/16/2023. During that hospitalization, the patient was seen by GI for decompensated liver cirrhosis with worsening ascites and hepatic hydrothorax.  He was seen by GI who increased his Lasix  to 40 mg twice daily and spironolactone  100 mg daily.  His Coreg  was held in part due to soft blood pressures he was instructed to continue lactulose .  He was seen by pulmonary, and he underwent thoracocentesis of his right pleural effusion removing 2 L of fluid.   He was felt to be consistent with a hepatic hydrothorax.  He was noted to have recurrent epistaxis secondary to his thrombocytopenia.  He was transfused 3 units of platelets and 1 unit PRBC during the hospitalization.  He also had hematuria during his last hospitalization.  Urology was contacted and felt the patient was appropriate for outpatient follow-up as his CT abdomen did not show any ureteral stones or acute GU abnormalities.   Assessment/Plan:  Decompensated alcohol liver cirrhosis - Presenting with recurrent right pleural effusion and ascites - Patient endorses compliance with his medications - Continue furosemide  and spironolactone  - 12/30/23 MELD 3. 0 = 11 - Request paracentesis--not enough fluid for para   Recurrent right small effusion--transudate - 11/28 thoracocentesis--2.4L removed - Sent for cell count and chemistries - transudate by Light's criteria - Secondary to hepatic hydrothorax   Pancytopenia - Secondary to liver cirrhosis - Check iron  studies - Folic acid --19.2 - B12--849 - counts drifted down in part due to dilution - transfuse one unit PRBC and one unit platelets - GI consult in pt with varices   Fever - UA negative for pyuria - Follow-up blood cultures--neg to date - Check PCT--0.11 - 12/29/2023 CTA chest--as discussed above, not definitive for consolidation - Continue ceftriaxone  and azithromycin  for now - MRSA screen--neg - Check COVID/flu/RSV--neg - afebrile and hemodynamically stable since admission   Microcytic anemia - iron  saturation 16 - ferritin 40 -B12 849 -folate 19.2   Hypomagnesemia/hypokalemia  -replete   Hyponatremia -due to liver cirrhosis  Atypical  chest pain -troponin neg x 2 -Echo pending  THC use -cessation discussed         Family Communication:   no Family at bedside   Consultants:  none   Code Status:  FULL    DVT Prophylaxis:  SCDs     Procedures: As Listed in Progress Note Above    Antibiotics: Ceftriaxone  11/28>> Azithro 11/28>>          Subjective: Patient denies fevers, chills, headache, chest pain, dyspnea, nausea, vomiting, diarrhea, abdominal pain, dysuria, hematuria, hematochezia, and melena.   Objective: Vitals:   12/30/23 1051 12/30/23 1305 12/30/23 1942 12/31/23 0434  BP: 122/70 110/70 115/62 (!) 111/56  Pulse: 90  100 69  Resp: 18 18 18 16   Temp: 98.2 F (36.8 C) 98.2 F (36.8 C) 99.1 F (37.3 C) 99.2 F (37.3 C)  TempSrc: Oral Oral Oral Oral  SpO2: 95% 95% 92% 99%  Weight:      Height:        Intake/Output Summary (Last 24 hours) at 12/31/2023 0745 Last data filed at 12/31/2023 0209 Gross per 24 hour  Intake 461.48 ml  Output 200 ml  Net 261.48 ml   Weight change:  Exam:  General:  Pt is alert, follows commands appropriately, not in acute distress HEENT: No icterus, No thrush, No neck mass, [place]/AT Cardiovascular: RRR, S1/S2, no rubs, no gallops Respiratory: bibasilar  crackles, R>L.  No wheeze Abdomen: Soft/+BS, non tender, non distended, no guarding Extremities: No edema, No lymphangitis, No petechiae, No rashes, no synovitis   Data Reviewed: I have personally reviewed following labs and imaging studies Basic Metabolic Panel: Recent Labs  Lab 12/29/23 2358 12/30/23 0810 12/31/23 0232  NA 137  --  134*  K 3.6  --  3.3*  CL 103  --  98  CO2 24  --  29  GLUCOSE 114*  --  86  BUN 12  --  9  CREATININE 0.76  --  0.67  CALCIUM  8.1*  --  7.6*  MG  --  1.5* 1.7  PHOS  --  3.6  --    Liver Function Tests: Recent Labs  Lab 12/29/23 2358  AST 86*  ALT 38  ALKPHOS 163*  BILITOT 1.7*  PROT 6.9  ALBUMIN  3.4*   Recent Labs  Lab 12/29/23 2358  LIPASE 63*   No results for input(s): AMMONIA in the last 168 hours. Coagulation Profile: Recent Labs  Lab 12/29/23 2358 12/31/23 0729  INR 1.3* 1.5*   CBC: Recent Labs  Lab 12/29/23 2358 12/31/23 0232  WBC 2.1* 1.2*  HGB 7.9* 7.0*  HCT 24.1* 21.4*  MCV  92.3 92.2  PLT 30* 18*   Cardiac Enzymes: No results for input(s): CKTOTAL, CKMB, CKMBINDEX, TROPONINI in the last 168 hours. BNP: Invalid input(s): POCBNP CBG: No results for input(s): GLUCAP in the last 168 hours. HbA1C: No results for input(s): HGBA1C in the last 72 hours. Urine analysis:    Component Value Date/Time   COLORURINE YELLOW 12/30/2023 1231   APPEARANCEUR CLEAR 12/30/2023 1231   LABSPEC 1.035 (H) 12/30/2023 1231   PHURINE 5.0 12/30/2023 1231   GLUCOSEU NEGATIVE 12/30/2023 1231   HGBUR MODERATE (A) 12/30/2023 1231   BILIRUBINUR NEGATIVE 12/30/2023 1231   BILIRUBINUR Negative 10/21/2016 1642   KETONESUR NEGATIVE 12/30/2023 1231   PROTEINUR NEGATIVE 12/30/2023 1231   UROBILINOGEN negative (A) 10/21/2016 1642   NITRITE NEGATIVE 12/30/2023 1231   LEUKOCYTESUR NEGATIVE 12/30/2023 1231   Sepsis Labs: @LABRCNTIP (procalcitonin:4,lacticidven:4) ) Recent Results (  from the past 240 hours)  Blood Culture (routine x 2)     Status: None (Preliminary result)   Collection Time: 12/30/23  2:33 AM   Specimen: BLOOD  Result Value Ref Range Status   Specimen Description BLOOD BLOOD RIGHT HAND  Final   Special Requests   Final    BOTTLES DRAWN AEROBIC AND ANAEROBIC Blood Culture adequate volume   Culture   Final    NO GROWTH 1 DAY Performed at [facility], [address] [place], [place] [address]    Report Status PENDING  Incomplete  Resp panel by RT-PCR (RSV, Flu A&B, Covid) Anterior Nasal Swab     Status: None   Collection Time: 12/30/23  2:33 AM   Specimen: Anterior Nasal Swab  Result Value Ref Range Status   SARS Coronavirus 2 by RT PCR NEGATIVE NEGATIVE Final    Comment: (NOTE) SARS-CoV-2 target nucleic acids are NOT DETECTED.  The SARS-CoV-2 RNA is generally detectable in upper respiratory specimens during the acute phase of infection. The lowest concentration of SARS-CoV-2 viral copies this assay can detect is 138 copies/mL. A negative result  does not preclude SARS-Cov-2 infection and should not be used as the sole basis for treatment or other patient management decisions. A negative result may occur with  improper specimen collection/handling, submission of specimen other than nasopharyngeal swab, presence of viral mutation(s) within the areas targeted by this assay, and inadequate number of viral copies(<138 copies/mL). A negative result must be combined with clinical observations, patient history, and epidemiological information. The expected result is Negative.  Fact Sheet for Patients:  bloggercourse.com  Fact Sheet for Healthcare Providers:  seriousbroker.it  This test is no t yet approved or cleared by the [place]  FDA and  has been authorized for detection and/or diagnosis of SARS-CoV-2 by FDA under an Emergency Use Authorization (EUA). This EUA will remain  in effect (meaning this test can be used) for the duration of the COVID-19 declaration under Section 564(b)(1) of the Act, 21 U.S.C.section 360bbb-3(b)(1), unless the authorization is terminated  or revoked sooner.       Influenza A by PCR NEGATIVE NEGATIVE Final   Influenza B by PCR NEGATIVE NEGATIVE Final    Comment: (NOTE) The Xpert Xpress SARS-CoV-2/FLU/RSV plus assay is intended as an aid in the diagnosis of influenza from Nasopharyngeal swab specimens and should not be used as a sole basis for treatment. Nasal washings and aspirates are unacceptable for Xpert Xpress SARS-CoV-2/FLU/RSV testing.  Fact Sheet for Patients: bloggercourse.com  Fact Sheet for Healthcare Providers: seriousbroker.it  This test is not yet approved or cleared by the [place]  FDA and has been authorized for detection and/or diagnosis of SARS-CoV-2 by FDA under an Emergency Use Authorization (EUA). This EUA will remain in effect (meaning this test can be used) for  the duration of the COVID-19 declaration under Section 564(b)(1) of the Act, 21 U.S.C. section 360bbb-3(b)(1), unless the authorization is terminated or revoked.     Resp Syncytial Virus by PCR NEGATIVE NEGATIVE Final    Comment: (NOTE) Fact Sheet for Patients: bloggercourse.com  Fact Sheet for Healthcare Providers: seriousbroker.it  This test is not yet approved or cleared by the [place]  FDA and has been authorized for detection and/or diagnosis of SARS-CoV-2 by FDA under an Emergency Use Authorization (EUA). This EUA will remain in effect (meaning this test can be used) for the duration of the COVID-19 declaration under Section 564(b)(1) of the Act, 21 U.S.C. section 360bbb-3(b)(1), unless the authorization  is terminated or revoked.  Performed at [facility], [address], [place], [place] [address]   Blood Culture (routine x 2)     Status: None (Preliminary result)   Collection Time: 12/30/23  2:35 AM   Specimen: BLOOD  Result Value Ref Range Status   Specimen Description BLOOD BLOOD LEFT HAND  Final   Special Requests   Final    BOTTLES DRAWN AEROBIC AND ANAEROBIC Blood Culture adequate volume   Culture   Final    NO GROWTH 1 DAY Performed at [facility], [address] [place], [place] [address]    Report Status PENDING  Incomplete  MRSA Next Gen by PCR, Nasal     Status: None   Collection Time: 12/30/23  3:13 AM   Specimen: Nasal Mucosa; Nasal Swab  Result Value Ref Range Status   MRSA by PCR Next Gen NOT DETECTED NOT DETECTED Final    Comment: (NOTE) The GeneXpert MRSA Assay (FDA approved for NASAL specimens only), is one component of a comprehensive MRSA colonization surveillance program. It is not intended to diagnose MRSA infection nor to guide or monitor treatment for MRSA infections. Test performance is not FDA approved in patients less than 46 years old. Performed at [facility], [address] [place], [place] [address]   Respiratory (~20 pathogens) panel by PCR     Status: None   Collection Time: 12/30/23  7:41 AM   Specimen: Nasopharyngeal Swab; Respiratory  Result Value Ref Range Status   Adenovirus NOT DETECTED NOT DETECTED Final   Coronavirus 229E NOT DETECTED NOT DETECTED Final    Comment: (NOTE) The Coronavirus on the Respiratory Panel, DOES NOT test for the novel  Coronavirus (2019 nCoV)    Coronavirus HKU1 NOT DETECTED NOT DETECTED Final   Coronavirus NL63 NOT DETECTED NOT DETECTED Final   Coronavirus OC43 NOT DETECTED NOT DETECTED Final   Metapneumovirus NOT DETECTED NOT DETECTED Final   Rhinovirus / Enterovirus NOT DETECTED NOT DETECTED Final   Influenza A NOT DETECTED NOT DETECTED Final   Influenza B NOT DETECTED NOT DETECTED Final   Parainfluenza Virus 1 NOT DETECTED NOT DETECTED Final   Parainfluenza Virus 2 NOT DETECTED NOT DETECTED Final   Parainfluenza Virus 3 NOT DETECTED NOT DETECTED Final   Parainfluenza Virus 4 NOT DETECTED NOT DETECTED Final   Respiratory Syncytial Virus NOT DETECTED NOT DETECTED Final   Bordetella pertussis NOT DETECTED NOT DETECTED Final   Bordetella Parapertussis NOT DETECTED NOT DETECTED Final   Chlamydophila pneumoniae NOT DETECTED NOT DETECTED Final   Mycoplasma pneumoniae NOT DETECTED NOT DETECTED Final    Comment: Performed at [facility], [address], [place], [place] [address]  Culture, body fluid w Gram Stain-bottle     Status: None (Preliminary result)   Collection Time: 12/30/23  9:40 AM   Specimen: Pleura  Result Value Ref Range Status   Specimen Description PLEURAL  Final   Special Requests 10CC BOTTLES DRAWN AEROBIC AND ANAEROBIC  Final   Culture   Final    NO GROWTH < 24 HOURS Performed at [facility], [address], [place], [place] [address]    Report Status PENDING  Incomplete  Gram stain     Status: None   Collection Time: 12/30/23  9:40 AM   Specimen: Pleura  Result Value Ref Range Status    Specimen Description PLEURAL  Final   Special Requests NONE  Final   Gram Stain   Final    WBC  PRESENT, PREDOMINANTLY MONONUCLEAR NO ORGANISMS SEEN CYTOSPIN SMEAR Performed at [facility], [address] [place], [place] [address]    Report Status 12/30/2023 FINAL  Final     Scheduled Meds:  sodium chloride    Intravenous Once   sodium chloride    Intravenous Once   dextromethorphan -guaiFENesin   1 tablet Oral BID   furosemide   40 mg Oral BID   lactulose   20 g Oral Daily   pantoprazole  (PROTONIX ) IV  40 mg Intravenous Q12H   potassium chloride   20 mEq Oral Once   spironolactone   100 mg Oral Daily   Continuous Infusions:  azithromycin  500 mg (12/31/23 0209)   cefTRIAXone  (ROCEPHIN )  IV 2 g (12/31/23 0741)   lactated ringers  50 mL/hr at 12/30/23 1308   magnesium  sulfate bolus IVPB 2 g (12/31/23 0728)   potassium chloride       Procedures/Studies: US  THORACENTESIS ASP PLEURAL SPACE W/IMG GUIDE Result Date: 12/30/2023 INDICATION: 43 year old male with history of recurrent hydrothorax. Request for diagnostic and therapeutic thoracentesis. EXAM: ULTRASOUND GUIDED RIGHT THORACENTESIS MEDICATIONS: 8 mL 1% lidocaine  COMPLICATIONS: None immediate. PROCEDURE: An ultrasound guided thoracentesis was thoroughly discussed with the patient and questions answered. The benefits, risks, alternatives and complications were also discussed. The patient understands and wishes to proceed with the procedure. Written consent was obtained. Ultrasound was performed to localize and mark an adequate pocket of fluid in the right chest. The area was then prepped and draped in the normal sterile fashion. 1% Lidocaine  was used for local anesthesia. Under ultrasound guidance a 6 Fr Safe-T-Centesis catheter was introduced. Thoracentesis was performed. The catheter was removed and a dressing applied. FINDINGS: A total of approximately 2.4 liters of clear, yellow fluid was removed. Samples were sent to the laboratory as  requested by the clinical team. IMPRESSION: Successful ultrasound guided right thoracentesis yielding 2.4 liters of pleural fluid. Performed by: Kacie Matthews PA-C Electronically Signed   By: Juliene Balder M.D.   On: 12/30/2023 11:06   US  ASCITES (ABDOMEN LIMITED) Result Date: 12/30/2023 EXAM: LIMITED ABDOMINAL ULTRASOUND FOR ASCITES EVALUATION TECHNIQUE: Limited real-time sonography of all 4 quadrants of the abdomen was performed for evaluation of ascites. COMPARISON: US  Abdomen 11/14/2023. CLINICAL HISTORY: Alcoholic cirrhosis of liver with ascites (HCC). FINDINGS: RIGHT UPPER QUADRANT: Small volume perihepatic ascites measuring 2.8 cm maximum vertical pocket depth. LEFT UPPER QUADRANT: No appreciable ascites. RIGHT LOWER QUADRANT: No appreciable ascites. LEFT LOWER QUADRANT: No appreciable ascites. OTHER: No appreciable ascites in the midline. Limited visualization of the rest of the abdomen demonstrates no acute abnormality. IMPRESSION: 1. Small volume perihepatic ascites, below threshold to perform paracentesis. 2. No appreciable ascites in the right lower quadrant, midline, or left abdomen. Electronically signed by: Selinda Blue MD 12/30/2023 10:55 AM EST RP Workstation: [id number]   DG Chest 1 View Result Date: 12/30/2023 CLINICAL DATA:  [id number] Status post thoracentesis [id number] EXAM: CHEST  1 VIEW COMPARISON:  12/30/2023 12 a.m. FINDINGS: Lower lung volumes with bronchovascular crowding. No new airspace consolidation. Near-complete resolution of the right pleural effusion. No pneumothorax. No cardiomegaly.No acute fracture or destructive lesion. IMPRESSION: Near-complete resolution of the right pleural effusion. No pneumothorax. Electronically Signed   By: Rogelia Myers M.D.   On: 12/30/2023 10:17   CT Angio Chest PE W and/or Wo Contrast Result Date: 12/30/2023 EXAM: CTA CHEST 12/30/2023 05:10:24 AM TECHNIQUE: CTA of the chest was performed after the administration of intravenous contrast.  Multiplanar reformatted images are provided for review. MIP images are provided for review. Automated exposure control,  iterative reconstruction, and/or weight based adjustment of the mA/kV was utilized to reduce the radiation dose to as low as reasonably achievable. COMPARISON: CT of the chest dated 11/11/2023. CLINICAL HISTORY: Pulmonary embolism (PE) suspected, low to intermediate prob, positive D-dimer. FINDINGS: PULMONARY ARTERIES: Pulmonary arteries are adequately opacified for evaluation. No acute pulmonary embolus. Main pulmonary artery is dilated, measuring approximately 3.6 cm in diameter. MEDIASTINUM: The heart and pericardium demonstrate no acute abnormality. There is no acute abnormality of the thoracic aorta. LYMPH NODES: No mediastinal, hilar or axillary lymphadenopathy. LUNGS AND PLEURA: There is a large right-sided pleural effusion present. There is passive atelectasis to the right lower lobe. No focal consolidation or pulmonary edema. No pneumothorax. UPPER ABDOMEN: The liver appears cirrhotic. The spleen is moderately enlarged. SOFT TISSUES AND BONES: No acute bone or soft tissue abnormality. IMPRESSION: 1. No evidence of pulmonary embolism. 2. Large right-sided pleural effusion with associated passive atelectasis in the right lower lobe. 3. Dilated main pulmonary artery measuring approximately 3.6 cm in diameter, which can be seen with pulmonary hypertension; correlate clinically. 4. Cirrhotic liver and moderately enlarged spleen. Electronically signed by: Evalene Coho MD 12/30/2023 05:29 AM EST RP Workstation: [id number]   DG Chest 2 View Result Date: 12/30/2023 EXAM: 2 VIEW(S) XRAY OF THE CHEST 12/30/2023 12:22:02 AM COMPARISON: Comparison with 11/15/2023. CLINICAL HISTORY: SOB. FINDINGS: LUNGS AND PLEURA: Large right pleural effusion with basilar atelectasis or consolidation. This is decreasing in size since the previous study, although possibly due to differences in patient position.  Left lung is clear. No pneumothorax. HEART AND MEDIASTINUM: Heart size is normal. Mediastinal contours appear intact. BONES AND SOFT TISSUES: No acute osseous abnormality. IMPRESSION: 1. Large right pleural effusion with basilar atelectasis or consolidation, decreased compared to prior, though positional factors may contribute. Electronically signed by: Elsie Gravely MD 12/30/2023 12:24 AM EST RP Workstation: [id number]    Alm Schneider, DO  [facility]  If 7PM-7AM, please contact night-coverage [email or web address] Password [email or web address] 12/31/2023, 7:45 AM   LOS: 1 day

## 2023-12-31 NOTE — Plan of Care (Signed)
   Problem: Education: Goal: Knowledge of General Education information will improve Description: Including pain rating scale, medication(s)/side effects and non-pharmacologic comfort measures Outcome: Progressing   Problem: Health Behavior/Discharge Planning: Goal: Ability to manage health-related needs will improve Outcome: Progressing   Problem: Clinical Measurements: Goal: Ability to maintain clinical measurements within normal limits will improve Outcome: Progressing Goal: Will remain free from infection Outcome: Progressing Goal: Diagnostic test results will improve Outcome: Progressing Goal: Respiratory complications will improve Outcome: Progressing Goal: Cardiovascular complication will be avoided Outcome: Progressing   Problem: Activity: Goal: Risk for activity intolerance will decrease Outcome: Progressing   Problem: Nutrition: Goal: Adequate nutrition will be maintained Outcome: Progressing   Problem: Coping: Goal: Level of anxiety will decrease Outcome: Progressing   Problem: Elimination: Goal: Will not experience complications related to bowel motility Outcome: Progressing Goal: Will not experience complications related to urinary retention Outcome: Progressing   Problem: Pain Managment: Goal: General experience of comfort will improve and/or be controlled Outcome: Progressing   Problem: Safety: Goal: Ability to remain free from injury will improve Outcome: Progressing   Problem: Skin Integrity: Goal: Risk for impaired skin integrity will decrease Outcome: Progressing   Problem: Activity: Goal: Ability to tolerate increased activity will improve Outcome: Progressing   Problem: Respiratory: Goal: Ability to maintain adequate ventilation will improve Outcome: Progressing Goal: Ability to maintain a clear airway will improve Outcome: Progressing

## 2023-12-31 NOTE — Progress Notes (Signed)
 Latest Reference Range & Units 12/31/23 02:32  WBC 4.0 - 10.5 K/uL 1.2 (LL)  (LL): Data is critically low  Latest Reference Range & Units 12/31/23 02:32  Platelets 150 - 400 K/uL 18 (LL)  (LL): Data is critically low

## 2023-12-31 NOTE — Progress Notes (Signed)
 Received call from blood bank stating patients blood is ready for pick up however platelets will have to come from [facility] and will have to be delivered. Nurse will be notified when they are available.

## 2023-12-31 NOTE — Plan of Care (Signed)

## 2023-12-31 NOTE — Progress Notes (Signed)
 CRITICAL VALUE STICKER  CRITICAL VALUE: WBC 10.3; PLTS 22  RECEIVER (on-site recipient of call): Deidre Backbone, RN  DATE & TIME NOTIFIED: 12/31/2023 1643   MD NOTIFIED: Dr. Evonnie paged  RESPONSE: transfuse 1 unit of PLTS & PRBC.

## 2023-12-31 NOTE — Progress Notes (Deleted)
 Latest Reference Range & Units 12/29/23 23:58  Basic metabolic panel with GFR  Rpt !  Sodium 135 - 145 mmol/L 137  Potassium 3.5 - 5.1 mmol/L 3.6  Chloride 98 - 111 mmol/L 103  CO2 22 - 32 mmol/L 24  Glucose 70 - 99 mg/dL 885 (H)  BUN 6 - 20 mg/dL 12  Creatinine 9.38 - 8.75 mg/dL 9.23  Calcium  8.9 - 10.3 mg/dL 8.1 (L)  Anion gap 5 - 15  10  Alkaline Phosphatase 38 - 126 U/L 163 (H)  Albumin  3.5 - 5.0 g/dL 3.4 (L)  Lipase 11 - 51 U/L 63 (H)  AST 15 - 41 U/L 86 (H)  ALT 0 - 44 U/L 38  Total Protein 6.5 - 8.1 g/dL 6.9  Bilirubin, Direct 0.0 - 0.2 mg/dL 1.0 (H)  Indirect Bilirubin 0.3 - 0.9 mg/dL 0.7  Total Bilirubin 0.0 - 1.2 mg/dL 1.7 (H)  GFR, Estimated >60 mL/min >60  WBC 4.0 - 10.5 K/uL 2.1 (L)  RBC 4.22 - 5.81 MIL/uL 2.61 (L)  Hemoglobin 13.0 - 17.0 g/dL 7.9 (L)  HCT 60.9 - 47.9 % 24.1 (L)  MCV 80.0 - 100.0 fL 92.3  MCH 26.0 - 34.0 pg 30.3  MCHC 30.0 - 36.0 g/dL 67.1  RDW 88.4 - 84.4 % 21.2 (H)  Platelets 150 - 400 K/uL 30 (L)  nRBC 0.0 - 0.2 % 0.0  Prothrombin Time 11.4 - 15.2 seconds 17.0 (H)  INR 0.8 - 1.2  1.3 (H)  !: Data is abnormal (H): Data is abnormally high (L): Data is abnormally low Rpt: View report in Results Review for more information

## 2024-01-01 ENCOUNTER — Inpatient Hospital Stay (HOSPITAL_COMMUNITY)

## 2024-01-01 DIAGNOSIS — D696 Thrombocytopenia, unspecified: Secondary | ICD-10-CM | POA: Diagnosis not present

## 2024-01-01 DIAGNOSIS — K7460 Unspecified cirrhosis of liver: Secondary | ICD-10-CM | POA: Diagnosis not present

## 2024-01-01 DIAGNOSIS — K7290 Hepatic failure, unspecified without coma: Secondary | ICD-10-CM | POA: Diagnosis not present

## 2024-01-01 DIAGNOSIS — D61818 Other pancytopenia: Secondary | ICD-10-CM | POA: Diagnosis not present

## 2024-01-01 DIAGNOSIS — J90 Pleural effusion, not elsewhere classified: Secondary | ICD-10-CM | POA: Diagnosis not present

## 2024-01-01 DIAGNOSIS — J948 Other specified pleural conditions: Secondary | ICD-10-CM | POA: Diagnosis not present

## 2024-01-01 LAB — BPAM RBC
Blood Product Expiration Date: 202512222359
Blood Product Expiration Date: 202512242359
ISSUE DATE / TIME: 202511290927
ISSUE DATE / TIME: 202511291714
Unit Type and Rh: 5100
Unit Type and Rh: 5100

## 2024-01-01 LAB — PREPARE PLATELET PHERESIS
Unit division: 0
Unit division: 0

## 2024-01-01 LAB — BPAM PLATELET PHERESIS
Blood Product Expiration Date: 202511302359
Blood Product Expiration Date: 202512012359
ISSUE DATE / TIME: 202511290758
ISSUE DATE / TIME: 202511292112
Unit Type and Rh: 5100
Unit Type and Rh: 5100

## 2024-01-01 LAB — COMPREHENSIVE METABOLIC PANEL WITH GFR
ALT: 22 U/L (ref 0–44)
AST: 42 U/L — ABNORMAL HIGH (ref 15–41)
Albumin: 3.6 g/dL (ref 3.5–5.0)
Alkaline Phosphatase: 84 U/L (ref 38–126)
Anion gap: 9 (ref 5–15)
BUN: 9 mg/dL (ref 6–20)
CO2: 27 mmol/L (ref 22–32)
Calcium: 7.9 mg/dL — ABNORMAL LOW (ref 8.9–10.3)
Chloride: 97 mmol/L — ABNORMAL LOW (ref 98–111)
Creatinine, Ser: 0.71 mg/dL (ref 0.61–1.24)
GFR, Estimated: >60 mL/min (ref >60)
Glucose, Bld: 84 mg/dL (ref 70–99)
Potassium: 3.5 mmol/L (ref 3.5–5.1)
Sodium: 133 mmol/L — ABNORMAL LOW (ref 135–145)
Total Bilirubin: 2.5 mg/dL — ABNORMAL HIGH (ref 0.0–1.2)
Total Protein: 6.4 g/dL — ABNORMAL LOW (ref 6.5–8.1)

## 2024-01-01 LAB — TYPE AND SCREEN
ABO/RH(D): O POS
Antibody Screen: NEGATIVE
Unit division: 0
Unit division: 0

## 2024-01-01 LAB — LEGIONELLA PNEUMOPHILA SEROGP 1 UR AG: L. pneumophila Serogp 1 Ur Ag: NEGATIVE

## 2024-01-01 LAB — PROTIME-INR
INR: 1.6 — ABNORMAL HIGH (ref 0.8–1.2)
Prothrombin Time: 20.3 s — ABNORMAL HIGH (ref 11.4–15.2)

## 2024-01-01 MED ORDER — SODIUM CHLORIDE 0.9% IV SOLUTION: Freq: Once | INTRAVENOUS | Status: AC

## 2024-01-01 MED ORDER — IBUPROFEN 400 MG PO TABS
400.0000 mg | ORAL_TABLET | Freq: Once | ORAL | Status: AC
  Administered 2024-01-01: 400 mg via ORAL
  Filled 2024-01-01: qty 1

## 2024-01-01 MED ORDER — SPIRONOLACTONE 100 MG PO TABS
200.0000 mg | ORAL_TABLET | Freq: Every day | ORAL | Status: DC
  Administered 2024-01-02: 200 mg via ORAL
  Filled 2024-01-01: qty 2

## 2024-01-01 MED ORDER — ALBUMIN HUMAN 25 % IV SOLN
50.0000 g | Freq: Three times a day (TID) | INTRAVENOUS | Status: AC
  Administered 2024-01-01 – 2024-01-02 (×3): 50 g via INTRAVENOUS
  Filled 2024-01-01 (×3): qty 200

## 2024-01-01 MED ORDER — FUROSEMIDE 40 MG PO TABS
80.0000 mg | ORAL_TABLET | Freq: Every morning | ORAL | Status: DC
  Administered 2024-01-02: 80 mg via ORAL
  Filled 2024-01-01: qty 2

## 2024-01-01 MED ORDER — SODIUM CHLORIDE 0.9 % IV SOLN
200.0000 mg | Freq: Once | INTRAVENOUS | Status: AC
  Administered 2024-01-01: 200 mg via INTRAVENOUS
  Filled 2024-01-01: qty 10

## 2024-01-01 MED ORDER — FUROSEMIDE 40 MG PO TABS
40.0000 mg | ORAL_TABLET | Freq: Every day | ORAL | Status: DC
  Administered 2024-01-01: 40 mg via ORAL
  Filled 2024-01-01: qty 1

## 2024-01-01 NOTE — Consult Note (Signed)
 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 On chart review patient has alcoholic cirrhosis with esophageal varices and hepatic hydrothorax.  He is followed at [facility] hepatology clinic and hepatic transplant is being discussed.  It seems outpatient TIPS is also been discussed.  He presented to [facility] with shortness of breath related to hepatic hydrothorax.  At this point I think the best thing to do would be to get him care at his transplant center for evaluation for TIPS since this was already being discussed.  Can proceed with thoracentesis with interventional radiology for symptomatic relief until then.  Would not recommend placement of any kind of indwelling pleural catheter for management of hepatic hydrothorax.  Verdon Gore, MD Pulmonary and Critical Care Medicine [facility] 01/01/2024 1:44 PM Pager: see AMION  If no response to pager, please call critical care on call (see AMION) until 7pm After 7:00 pm call Elink

## 2024-01-01 NOTE — Plan of Care (Signed)
   Problem: Activity: Goal: Risk for activity intolerance will decrease Outcome: Progressing   Problem: Coping: Goal: Level of anxiety will decrease Outcome: Progressing

## 2024-01-01 NOTE — Progress Notes (Addendum)
 PROGRESS NOTE  Robert RUHLAND FMW:[id number] DOB: 08-14-1980 DOA: 12/29/2023 PCP: Vincente Shivers, NP  Brief History:  43 year old male with a history of alcoholic liver cirrhosis, hepatic hydrothorax, portal hypertension with esophageal varices, alcohol abuse presenting with shortness of breath for about a week.  He states that worsened significantly on the evening of 12/29/2023.  He endorsed a fever of 103.0 F at home on 12/29/2023.  He denies headache, coughing, hemoptysis, nausea, vomiting, diarrhea, abdominal pain.  He did have some chest discomfort substernal yesterday.  He denies any worsening lower extremity edema.  He does feel that his abdomen is little bit distended.  He endorses compliance with all his medications.  He states that he has not drank any alcohol since his last hospitalization in Oct 2025.  He denies any tobacco or illicit drug use.  He states that he continues to have intermittent epistaxis.  In the ED, the patient had a low-grade temperature of 99.2 F.  He was hemodynamically stable with oxygen saturation 92-94% on room air.  He was placed on 2 L with saturation up to 100%.  CTA chest was negative for PE.  It showed a large right pleural effusion with atelectasis.  There was dilated pulmonary artery up to 3.600 m.  There was a cirrhotic liver with splenomegaly.  The patient was admitted for further evaluation and treatment of his pleural effusion and dyspnea.  The patient had a recent hospitalization from 11/11/2023 to 11/16/2023. During that hospitalization, the patient was seen by GI for decompensated liver cirrhosis with worsening ascites and hepatic hydrothorax.  He was seen by GI who increased his Lasix  to 40 mg twice daily and spironolactone  100 mg daily.  His Coreg  was held in part due to soft blood pressures he was instructed to continue lactulose .  He was seen by pulmonary, and he underwent thoracocentesis of his right pleural effusion removing 2 L of fluid.   He was felt to be consistent with a hepatic hydrothorax.  He was noted to have recurrent epistaxis secondary to his thrombocytopenia.  He was transfused 3 units of platelets and 1 unit PRBC during the hospitalization.  He also had hematuria during his last hospitalization.  Urology was contacted and felt the patient was appropriate for outpatient follow-up as his CT abdomen did not show any ureteral stones or acute GU abnormalities.   Assessment/Plan:  Decompensated alcohol liver cirrhosis - Presenting with recurrent right pleural effusion and ascites - Patient endorses compliance with his medications - Continue furosemide  and spironolactone  - 12/30/23 MELD 3. 0 = 11 - 01/01/24 MELD 3.0 = 17 - Request paracentesis--not enough fluid for para   Recurrent right small effusion--transudate - 11/28 thoracocentesis--2.4L removed - Sent for cell count and chemistries - transudate by Light's criteria - Secondary to hepatic hydrothorax -11/30--initially concerned about pneumothorax on follow up CXR--discussed with radiology, Dr. Tammi PTX - 11/30 CXR--recurrent right pleural effusion--reaccumulated - 11/30--discussed with Dr. Loyola PTX -11/30--I called [facility] hepatology--spoke with Dr. Maree regarding transfer for [facility]>>no beds available, no wait list available -plan for repeat thora 01/02/24   Pancytopenia - Secondary to liver cirrhosis - iron  saturation 16, ferritin 40 - venofer  200 mg x 1 given - Folic acid --19.2 - B12--849 - counts drifted down in part due to dilution - transfused 2 units PRBC and 2 units platelets - GI consult appreciated - give additional unit platelets today to make total of 3   Fever - UA negative  for pyuria - Follow-up blood cultures--neg to date - Check PCT--0.11 - 12/29/2023 CTA chest--as discussed above, not definitive for consolidation - Continued ceftriaxone  and azithromycin  - MRSA screen--neg - Check COVID/flu/RSV--neg - afebrile and  hemodynamically stable since admission   Microcytic anemia - iron  saturation 16 - ferritin 40 -B12 849 -folate 19.2   Hypomagnesemia/hypokalemia  -repleted   Hyponatremia -due to liver cirrhosis   Atypical chest pain -troponin neg x 2 -12/30/23 Echo EF 65-70%, no WMA, normal RVF   THC use -cessation discussed         Family Communication:   no Family at bedside   Consultants:  none   Code Status:  FULL    DVT Prophylaxis:  SCDs     Procedures: As Listed in Progress Note Above   Antibiotics: Ceftriaxone  11/28>> Azithro 11/28>>        Total time spent 50 minutes.  Greater than 50% spent face to face counseling and coordinating care.     Subjective: Pt sitting up eating breakfast.  Denies sob, cp, n/v/d, abd pain  Objective: Vitals:   12/31/23 2101 12/31/23 2140 01/01/24 0025 01/01/24 0531  BP: 118/65 (!) 120/58 124/77 120/71  Pulse: 82 82 86   Resp: 18 18 18 16   Temp: 99 F (37.2 C) 99 F (37.2 C) 99.1 F (37.3 C) 98.2 F (36.8 C)  TempSrc: Oral Oral Oral   SpO2: 91% 90% 92% 95%  Weight:      Height:        Intake/Output Summary (Last 24 hours) at 01/01/2024 0851 Last data filed at 01/01/2024 0542 Gross per 24 hour  Intake 4332.66 ml  Output 3640 ml  Net 692.66 ml   Weight change:  Exam:  General:  Pt is alert, follows commands appropriately, not in acute distress HEENT: No icterus, No thrush, No neck mass, [place]/AT Cardiovascular: RRR, S1/S2, no rubs, no gallops Respiratory: diminshed BS on right. Left basilar crackles Abdomen: Soft/+BS, non tender, non distended, no guarding Extremities: No edema, No lymphangitis, No petechiae, No rashes, no synovitis   Data Reviewed: I have personally reviewed following labs and imaging studies Basic Metabolic Panel: Recent Labs  Lab 12/29/23 2358 12/30/23 0810 12/31/23 0232 01/01/24 0504  NA 137  --  134* 133*  K 3.6  --  3.3* 3.5  CL 103  --  98 97*  CO2 24  --  29 27  GLUCOSE 114*   --  86 84  BUN 12  --  9 9  CREATININE 0.76  --  0.67 0.71  CALCIUM  8.1*  --  7.6* 7.9*  MG  --  1.5* 1.7  --   PHOS  --  3.6  --   --    Liver Function Tests: Recent Labs  Lab 12/29/23 2358 12/31/23 0729 01/01/24 0504  AST 86* 61* 42*  ALT 38 29 22  ALKPHOS 163* 90 84  BILITOT 1.7* 3.0* 2.5*  PROT 6.9 6.0* 6.4*  ALBUMIN  3.4* 2.9* 3.6   Recent Labs  Lab 12/29/23 2358  LIPASE 63*   No results for input(s): AMMONIA in the last 168 hours. Coagulation Profile: Recent Labs  Lab 12/29/23 2358 12/31/23 0729 01/01/24 0504  INR 1.3* 1.5* 1.6*   CBC: Recent Labs  Lab 12/29/23 2358 12/31/23 0232 12/31/23 1554 01/01/24 0504  WBC 2.1* 1.2* 1.3* 1.9*  HGB 7.9* 7.0* 7.4* 8.1*  HCT 24.1* 21.4* 22.3* 24.2*  MCV 92.3 92.2 92.1 92.7  PLT 30* 18* 22* 26*   Cardiac  Enzymes: No results for input(s): CKTOTAL, CKMB, CKMBINDEX, TROPONINI in the last 168 hours. BNP: Invalid input(s): POCBNP CBG: No results for input(s): GLUCAP in the last 168 hours. HbA1C: No results for input(s): HGBA1C in the last 72 hours. Urine analysis:    Component Value Date/Time   COLORURINE YELLOW 12/30/2023 1231   APPEARANCEUR CLEAR 12/30/2023 1231   LABSPEC 1.035 (H) 12/30/2023 1231   PHURINE 5.0 12/30/2023 1231   GLUCOSEU NEGATIVE 12/30/2023 1231   HGBUR MODERATE (A) 12/30/2023 1231   BILIRUBINUR NEGATIVE 12/30/2023 1231   BILIRUBINUR Negative 10/21/2016 1642   KETONESUR NEGATIVE 12/30/2023 1231   PROTEINUR NEGATIVE 12/30/2023 1231   UROBILINOGEN negative (A) 10/21/2016 1642   NITRITE NEGATIVE 12/30/2023 1231   LEUKOCYTESUR NEGATIVE 12/30/2023 1231   Sepsis Labs: @LABRCNTIP (procalcitonin:4,lacticidven:4) ) Recent Results (from the past 240 hours)  Blood Culture (routine x 2)     Status: None (Preliminary result)   Collection Time: 12/30/23  2:33 AM   Specimen: BLOOD  Result Value Ref Range Status   Specimen Description BLOOD BLOOD RIGHT HAND  Final   Special Requests    Final    BOTTLES DRAWN AEROBIC AND ANAEROBIC Blood Culture adequate volume   Culture   Final    NO GROWTH 2 DAYS Performed at [facility], [address], [place], [place] [address]    Report Status PENDING  Incomplete  Resp panel by RT-PCR (RSV, Flu A&B, Covid) Anterior Nasal Swab     Status: None   Collection Time: 12/30/23  2:33 AM   Specimen: Anterior Nasal Swab  Result Value Ref Range Status   SARS Coronavirus 2 by RT PCR NEGATIVE NEGATIVE Final    Comment: (NOTE) SARS-CoV-2 target nucleic acids are NOT DETECTED.  The SARS-CoV-2 RNA is generally detectable in upper respiratory specimens during the acute phase of infection. The lowest concentration of SARS-CoV-2 viral copies this assay can detect is 138 copies/mL. A negative result does not preclude SARS-Cov-2 infection and should not be used as the sole basis for treatment or other patient management decisions. A negative result may occur with  improper specimen collection/handling, submission of specimen other than nasopharyngeal swab, presence of viral mutation(s) within the areas targeted by this assay, and inadequate number of viral copies(<138 copies/mL). A negative result must be combined with clinical observations, patient history, and epidemiological information. The expected result is Negative.  Fact Sheet for Patients:  bloggercourse.com  Fact Sheet for Healthcare Providers:  seriousbroker.it  This test is no t yet approved or cleared by the [place]  FDA and  has been authorized for detection and/or diagnosis of SARS-CoV-2 by FDA under an Emergency Use Authorization (EUA). This EUA will remain  in effect (meaning this test can be used) for the duration of the COVID-19 declaration under Section 564(b)(1) of the Act, 21 U.S.C.section 360bbb-3(b)(1), unless the authorization is terminated  or revoked sooner.       Influenza A by PCR NEGATIVE NEGATIVE Final    Influenza B by PCR NEGATIVE NEGATIVE Final    Comment: (NOTE) The Xpert Xpress SARS-CoV-2/FLU/RSV plus assay is intended as an aid in the diagnosis of influenza from Nasopharyngeal swab specimens and should not be used as a sole basis for treatment. Nasal washings and aspirates are unacceptable for Xpert Xpress SARS-CoV-2/FLU/RSV testing.  Fact Sheet for Patients: bloggercourse.com  Fact Sheet for Healthcare Providers: seriousbroker.it  This test is not yet approved or cleared by the [place]  FDA and has been authorized for detection and/or diagnosis of SARS-CoV-2  by FDA under an Emergency Use Authorization (EUA). This EUA will remain in effect (meaning this test can be used) for the duration of the COVID-19 declaration under Section 564(b)(1) of the Act, 21 U.S.C. section 360bbb-3(b)(1), unless the authorization is terminated or revoked.     Resp Syncytial Virus by PCR NEGATIVE NEGATIVE Final    Comment: (NOTE) Fact Sheet for Patients: bloggercourse.com  Fact Sheet for Healthcare Providers: seriousbroker.it  This test is not yet approved or cleared by the [place]  FDA and has been authorized for detection and/or diagnosis of SARS-CoV-2 by FDA under an Emergency Use Authorization (EUA). This EUA will remain in effect (meaning this test can be used) for the duration of the COVID-19 declaration under Section 564(b)(1) of the Act, 21 U.S.C. section 360bbb-3(b)(1), unless the authorization is terminated or revoked.  Performed at [facility], [address], [place], [place] [address]   Blood Culture (routine x 2)     Status: None (Preliminary result)   Collection Time: 12/30/23  2:35 AM   Specimen: BLOOD  Result Value Ref Range Status   Specimen Description BLOOD BLOOD LEFT HAND  Final   Special Requests   Final    BOTTLES DRAWN AEROBIC AND ANAEROBIC Blood Culture  adequate volume   Culture   Final    NO GROWTH 2 DAYS Performed at [facility], [address] [place], [place] [address]    Report Status PENDING  Incomplete  MRSA Next Gen by PCR, Nasal     Status: None   Collection Time: 12/30/23  3:13 AM   Specimen: Nasal Mucosa; Nasal Swab  Result Value Ref Range Status   MRSA by PCR Next Gen NOT DETECTED NOT DETECTED Final    Comment: (NOTE) The GeneXpert MRSA Assay (FDA approved for NASAL specimens only), is one component of a comprehensive MRSA colonization surveillance program. It is not intended to diagnose MRSA infection nor to guide or monitor treatment for MRSA infections. Test performance is not FDA approved in patients less than 33 years old. Performed at [facility], [address] [place], [place] [address]   Respiratory (~20 pathogens) panel by PCR     Status: None   Collection Time: 12/30/23  7:41 AM   Specimen: Nasopharyngeal Swab; Respiratory  Result Value Ref Range Status   Adenovirus NOT DETECTED NOT DETECTED Final   Coronavirus 229E NOT DETECTED NOT DETECTED Final    Comment: (NOTE) The Coronavirus on the Respiratory Panel, DOES NOT test for the novel  Coronavirus (2019 nCoV)    Coronavirus HKU1 NOT DETECTED NOT DETECTED Final   Coronavirus NL63 NOT DETECTED NOT DETECTED Final   Coronavirus OC43 NOT DETECTED NOT DETECTED Final   Metapneumovirus NOT DETECTED NOT DETECTED Final   Rhinovirus / Enterovirus NOT DETECTED NOT DETECTED Final   Influenza A NOT DETECTED NOT DETECTED Final   Influenza B NOT DETECTED NOT DETECTED Final   Parainfluenza Virus 1 NOT DETECTED NOT DETECTED Final   Parainfluenza Virus 2 NOT DETECTED NOT DETECTED Final   Parainfluenza Virus 3 NOT DETECTED NOT DETECTED Final   Parainfluenza Virus 4 NOT DETECTED NOT DETECTED Final   Respiratory Syncytial Virus NOT DETECTED NOT DETECTED Final   Bordetella pertussis NOT DETECTED NOT DETECTED Final   Bordetella Parapertussis NOT DETECTED NOT DETECTED Final    Chlamydophila pneumoniae NOT DETECTED NOT DETECTED Final   Mycoplasma pneumoniae NOT DETECTED NOT DETECTED Final    Comment: Performed at [facility], [address], [place], [place] [address]  Culture, body fluid w Gram Stain-bottle     Status: None (Preliminary result)   Collection Time: 12/30/23  9:40 AM   Specimen: Pleura  Result Value Ref Range Status   Specimen Description PLEURAL  Final   Special Requests 10CC BOTTLES DRAWN AEROBIC AND ANAEROBIC  Final   Culture   Final    NO GROWTH 2 DAYS Performed at [facility], [address], [place], [place] [address]    Report Status PENDING  Incomplete  Gram stain     Status: None   Collection Time: 12/30/23  9:40 AM   Specimen: Pleura  Result Value Ref Range Status   Specimen Description PLEURAL  Final   Special Requests NONE  Final   Gram Stain   Final    WBC PRESENT, PREDOMINANTLY MONONUCLEAR NO ORGANISMS SEEN CYTOSPIN SMEAR Performed at [facility], [address], [place], [place] [address]    Report Status 12/30/2023 FINAL  Final  Urine Culture (for pregnant, neutropenic or urologic patients or patients with an indwelling urinary catheter)     Status: None   Collection Time: 12/30/23 12:32 PM   Specimen: Urine, Clean Catch  Result Value Ref Range Status   Specimen Description   Final    URINE, CLEAN CATCH Performed at [facility], [address] [place], [place] [address]    Special Requests   Final    NONE Performed at [facility], [address] [place], [place] [address]    Culture   Final    NO GROWTH Performed at [facility], [address] [place], [place] [address]    Report Status 12/31/2023 FINAL  Final     Scheduled Meds:  dextromethorphan -guaiFENesin   1 tablet Oral BID   furosemide   40 mg Oral BID   lactulose   20 g Oral Daily   pantoprazole  (PROTONIX ) IV  40 mg Intravenous Q12H   spironolactone   150 mg Oral Daily   Continuous Infusions:  azithromycin  500 mg (01/01/24 0243)    cefTRIAXone  (ROCEPHIN )  IV 2 g (01/01/24 0815)   iron  sucrose      Procedures/Studies: ECHOCARDIOGRAM COMPLETE Result Date: 12/31/2023    ECHOCARDIOGRAM REPORT   Patient Name:   Ghassan Coggeshall CHATHAM HOWINGTON Date of Exam: 12/30/2023 Medical Rec #:  [id number]     Height:       70.0 in Accession #:    [id number]    Weight:       184.1 lb Date of Birth:  Nov 19, 1980    BSA:          2.015 m Patient Age:    42 years      BP:           116/70 mmHg Patient Gender: M             HR:           85 bpm. Exam Location:  [facility] Procedure: 2D Echo, Cardiac Doppler and Color Doppler (Both Spectral and Color            Flow Doppler were utilized during procedure). Indications:    Dyspnea R06.00  History:        Patient has no prior history of Echocardiogram examinations.                 Risk Factors:Hypertension and Dyslipidemia. Hx of ETOH abuse,                 Anasarca and Pleaural Effusion.  Sonographer:  Aida Pizza RCS Referring Phys: [phone] [place] IMPRESSIONS  1. Left ventricular ejection fraction, by estimation, is 65 to 70%. The left ventricle has normal function. The left ventricle has no regional wall motion abnormalities. There is mild left ventricular hypertrophy. Left ventricular diastolic parameters were normal.  2. Right ventricular systolic function is normal. The right ventricular size is normal. Tricuspid regurgitation signal is inadequate for assessing PA pressure.  3. Left atrial size was moderately dilated.  4. Right atrial size was moderately dilated.  5. The mitral valve is grossly normal. Trivial mitral valve regurgitation.  6. The aortic valve is tricuspid. Aortic valve regurgitation is not visualized. Aortic valve mean gradient measures 11.0 mmHg.  7. Aortic dilatation noted. There is mild dilatation of the aortic root, measuring 41 mm.  8. The inferior vena cava is normal in size with greater than 50% respiratory variability, suggesting right atrial pressure of 3 mmHg.  9. Increased flow velocities may  be secondary to anemia, thyrotoxicosis, hyperdynamic or high flow state. Comparison(s): No prior Echocardiogram. FINDINGS  Left Ventricle: Left ventricular ejection fraction, by estimation, is 65 to 70%. The left ventricle has normal function. The left ventricle has no regional wall motion abnormalities. The left ventricular internal cavity size was normal in size. There is  mild left ventricular hypertrophy. Left ventricular diastolic parameters were normal. Right Ventricle: The right ventricular size is normal. No increase in right ventricular wall thickness. Right ventricular systolic function is normal. Tricuspid regurgitation signal is inadequate for assessing PA pressure. Left Atrium: Left atrial size was moderately dilated. Right Atrium: Right atrial size was moderately dilated. Pericardium: Trivial pericardial effusion is present. The pericardial effusion is circumferential. Mitral Valve: The mitral valve is grossly normal. Trivial mitral valve regurgitation. Tricuspid Valve: The tricuspid valve is grossly normal. Tricuspid valve regurgitation is trivial. Aortic Valve: The aortic valve is tricuspid. Aortic valve regurgitation is not visualized. Aortic valve mean gradient measures 11.0 mmHg. Aortic valve peak gradient measures 21.0 mmHg. Aortic valve area, by VTI measures 3.95 cm. Pulmonic Valve: The pulmonic valve was normal in structure. Pulmonic valve regurgitation is not visualized. Aorta: Aortic dilatation noted. There is mild dilatation of the aortic root, measuring 41 mm. Venous: The inferior vena cava is normal in size with greater than 50% respiratory variability, suggesting right atrial pressure of 3 mmHg. IAS/Shunts: No atrial level shunt detected by color flow Doppler.  LEFT VENTRICLE PLAX 2D LVIDd:         5.50 cm   Diastology LVIDs:         2.40 cm   LV e' medial:    13.80 cm/s LV PW:         1.00 cm   LV E/e' medial:  10.9 LV IVS:        1.20 cm   LV e' lateral:   14.90 cm/s LVOT diam:      2.40 cm   LV E/e' lateral: 10.1 LV SV:         167 LV SV Index:   83 LVOT Area:     4.52 cm  RIGHT VENTRICLE RV S prime:     18.90 cm/s TAPSE (M-mode): 3.5 cm LEFT ATRIUM             Index        RIGHT ATRIUM           Index LA diam:        4.20 cm 2.08 cm/m   RA Area:  20.40 cm LA Vol (A2C):   94.5 ml 46.90 ml/m  RA Volume:   63.50 ml  31.51 ml/m LA Vol (A4C):   89.9 ml 44.61 ml/m LA Biplane Vol: 91.7 ml 45.51 ml/m  AORTIC VALVE AV Area (Vmax):    4.19 cm AV Area (Vmean):   3.36 cm AV Area (VTI):     3.95 cm AV Vmax:           229.00 cm/s AV Vmean:          155.000 cm/s AV VTI:            0.424 m AV Peak Grad:      21.0 mmHg AV Mean Grad:      11.0 mmHg LVOT Vmax:         212.00 cm/s LVOT Vmean:        115.000 cm/s LVOT VTI:          0.370 m LVOT/AV VTI ratio: 0.87  AORTA Ao Root diam: 4.10 cm MITRAL VALVE MV Area (PHT): 2.56 cm     SHUNTS MV Decel Time: 296 msec     Systemic VTI:  0.37 m MV E velocity: 151.00 cm/s  Systemic Diam: 2.40 cm MV A velocity: 99.60 cm/s MV E/A ratio:  1.52 Vinie Maxcy MD Electronically signed by Vinie Maxcy MD Signature Date/Time: 12/31/2023/12:29:35 PM    Final    US  THORACENTESIS ASP PLEURAL SPACE W/IMG GUIDE Result Date: 12/30/2023 INDICATION: 43 year old male with history of recurrent hydrothorax. Request for diagnostic and therapeutic thoracentesis. EXAM: ULTRASOUND GUIDED RIGHT THORACENTESIS MEDICATIONS: 8 mL 1% lidocaine  COMPLICATIONS: None immediate. PROCEDURE: An ultrasound guided thoracentesis was thoroughly discussed with the patient and questions answered. The benefits, risks, alternatives and complications were also discussed. The patient understands and wishes to proceed with the procedure. Written consent was obtained. Ultrasound was performed to localize and mark an adequate pocket of fluid in the right chest. The area was then prepped and draped in the normal sterile fashion. 1% Lidocaine  was used for local anesthesia. Under ultrasound guidance a 6  Fr Safe-T-Centesis catheter was introduced. Thoracentesis was performed. The catheter was removed and a dressing applied. FINDINGS: A total of approximately 2.4 liters of clear, yellow fluid was removed. Samples were sent to the laboratory as requested by the clinical team. IMPRESSION: Successful ultrasound guided right thoracentesis yielding 2.4 liters of pleural fluid. Performed by: Kacie Matthews PA-C Electronically Signed   By: Juliene Balder M.D.   On: 12/30/2023 11:06   US  ASCITES (ABDOMEN LIMITED) Result Date: 12/30/2023 EXAM: LIMITED ABDOMINAL ULTRASOUND FOR ASCITES EVALUATION TECHNIQUE: Limited real-time sonography of all 4 quadrants of the abdomen was performed for evaluation of ascites. COMPARISON: US  Abdomen 11/14/2023. CLINICAL HISTORY: Alcoholic cirrhosis of liver with ascites (HCC). FINDINGS: RIGHT UPPER QUADRANT: Small volume perihepatic ascites measuring 2.8 cm maximum vertical pocket depth. LEFT UPPER QUADRANT: No appreciable ascites. RIGHT LOWER QUADRANT: No appreciable ascites. LEFT LOWER QUADRANT: No appreciable ascites. OTHER: No appreciable ascites in the midline. Limited visualization of the rest of the abdomen demonstrates no acute abnormality. IMPRESSION: 1. Small volume perihepatic ascites, below threshold to perform paracentesis. 2. No appreciable ascites in the right lower quadrant, midline, or left abdomen. Electronically signed by: Selinda Blue MD 12/30/2023 10:55 AM EST RP Workstation: [id number]   DG Chest 1 View Result Date: 12/30/2023 CLINICAL DATA:  [id number] Status post thoracentesis [id number] EXAM: CHEST  1 VIEW COMPARISON:  12/30/2023 12 a.m. FINDINGS: Lower lung volumes with bronchovascular crowding. No new airspace  consolidation. Near-complete resolution of the right pleural effusion. No pneumothorax. No cardiomegaly.No acute fracture or destructive lesion. IMPRESSION: Near-complete resolution of the right pleural effusion. No pneumothorax. Electronically Signed   By: Rogelia Myers M.D.   On: 12/30/2023 10:17   CT Angio Chest PE W and/or Wo Contrast Result Date: 12/30/2023 EXAM: CTA CHEST 12/30/2023 05:10:24 AM TECHNIQUE: CTA of the chest was performed after the administration of intravenous contrast. Multiplanar reformatted images are provided for review. MIP images are provided for review. Automated exposure control, iterative reconstruction, and/or weight based adjustment of the mA/kV was utilized to reduce the radiation dose to as low as reasonably achievable. COMPARISON: CT of the chest dated 11/11/2023. CLINICAL HISTORY: Pulmonary embolism (PE) suspected, low to intermediate prob, positive D-dimer. FINDINGS: PULMONARY ARTERIES: Pulmonary arteries are adequately opacified for evaluation. No acute pulmonary embolus. Main pulmonary artery is dilated, measuring approximately 3.6 cm in diameter. MEDIASTINUM: The heart and pericardium demonstrate no acute abnormality. There is no acute abnormality of the thoracic aorta. LYMPH NODES: No mediastinal, hilar or axillary lymphadenopathy. LUNGS AND PLEURA: There is a large right-sided pleural effusion present. There is passive atelectasis to the right lower lobe. No focal consolidation or pulmonary edema. No pneumothorax. UPPER ABDOMEN: The liver appears cirrhotic. The spleen is moderately enlarged. SOFT TISSUES AND BONES: No acute bone or soft tissue abnormality. IMPRESSION: 1. No evidence of pulmonary embolism. 2. Large right-sided pleural effusion with associated passive atelectasis in the right lower lobe. 3. Dilated main pulmonary artery measuring approximately 3.6 cm in diameter, which can be seen with pulmonary hypertension; correlate clinically. 4. Cirrhotic liver and moderately enlarged spleen. Electronically signed by: Evalene Coho MD 12/30/2023 05:29 AM EST RP Workstation: [id number]   DG Chest 2 View Result Date: 12/30/2023 EXAM: 2 VIEW(S) XRAY OF THE CHEST 12/30/2023 12:22:02 AM COMPARISON: Comparison with  11/15/2023. CLINICAL HISTORY: SOB. FINDINGS: LUNGS AND PLEURA: Large right pleural effusion with basilar atelectasis or consolidation. This is decreasing in size since the previous study, although possibly due to differences in patient position. Left lung is clear. No pneumothorax. HEART AND MEDIASTINUM: Heart size is normal. Mediastinal contours appear intact. BONES AND SOFT TISSUES: No acute osseous abnormality. IMPRESSION: 1. Large right pleural effusion with basilar atelectasis or consolidation, decreased compared to prior, though positional factors may contribute. Electronically signed by: Elsie Gravely MD 12/30/2023 12:24 AM EST RP Workstation: [id number]    Alm Schneider, DO  [facility]  If 7PM-7AM, please contact night-coverage [email or web address] Password [email or web address] 01/01/2024, 8:51 AM   LOS: 2 days

## 2024-01-01 NOTE — Consult Note (Signed)
 [facility] Consult  Reason for Consult: Possible pneumothorax right side Referring Physician: Dr. Evonnie   Chief Complaint   Shortness of Breath     HPI: Robert Sellers is a 43 y.o. male with alcoholic cirrhosis, bleeding varices, who had a thoracentesis on Friday and CXR this AM has some concern for pneumothorax. Dr. Evonnie called me on the phone while I was driving in and when I got here, I reviewed the CXR that was done. I felt like this was likely just a hydropneumothorax but I ordered a left lateral decubitus to prove if there was any degree of pneumothorax.  Given his risk for chest tube I felt like repeat Xray was important as his platelets were 26 this AM.    Past Medical History:  Diagnosis Date   Acute blood loss anemia 10/01/2022   Acute upper GI bleeding 09/29/2022   Anxiety    Ascites    Clotting disorder    Depression    Hyperlipidemia 12/04/2012   Hypertension    Left shoulder pain 08/28/2015   Substance abuse [facility])     Past Surgical History:  Procedure Laterality Date   ESOPHAGOGASTRODUODENOSCOPY N/A 04/12/2023   Procedure: EGD (ESOPHAGOGASTRODUODENOSCOPY);  Surgeon: Jinny Carmine, MD;  Location: [facility] ENDOSCOPY;  Service: Endoscopy;  Laterality: N/A;   ESOPHAGOGASTRODUODENOSCOPY N/A 05/13/2023   Procedure: EGD (ESOPHAGOGASTRODUODENOSCOPY);  Surgeon: Onita Elspeth Sharper, DO;  Location: [facility] ENDOSCOPY;  Service: Gastroenterology;  Laterality: N/A;   ESOPHAGOGASTRODUODENOSCOPY N/A 06/03/2023   Procedure: EGD (ESOPHAGOGASTRODUODENOSCOPY);  Surgeon: Onita Elspeth Sharper, DO;  Location: [facility] ENDOSCOPY;  Service: Gastroenterology;  Laterality: N/A;   ESOPHAGOGASTRODUODENOSCOPY (EGD) WITH PROPOFOL  N/A 09/30/2022   Procedure: ESOPHAGOGASTRODUODENOSCOPY (EGD) WITH PROPOFOL ;  Surgeon: Toledo, Ladell POUR, MD;  Location: [facility] ENDOSCOPY;  Service: Gastroenterology;  Laterality: N/A;   GASTRIC VARICES BANDING  04/12/2023   Procedure: BAND LIGATION, GASTRIC VARICES;   Surgeon: Jinny Carmine, MD;  Location: [facility] ENDOSCOPY;  Service: Endoscopy;;   KNEE SURGERY     left shoulder surgery Left 2024   [facility]    Family History  Problem Relation Age of Onset   Heart disease Mother    Hyperlipidemia Mother    Hypertension Mother    Diabetes Mother    Early death Mother    Obesity Mother    Heart disease Father    Hyperlipidemia Father    Hypertension Father    Diabetes Father    Alcohol abuse Father    Early death Father    Kidney disease Father    Obesity Father     Social History   Tobacco Use   Smoking status: Never   Smokeless tobacco: Never  Vaping Use   Vaping status: Never Used  Substance Use Topics   Alcohol use: Not Currently    Alcohol/week: 1.0 standard drink of alcohol    Types: 1 Standard drinks or equivalent per week    Comment: consistent   Drug use: No    Medications: I have reviewed the patient's current medications. Prior to Admission:  Medications Prior to Admission  Medication Sig Dispense Refill Last Dose/Taking   carvedilol  (COREG ) 3.125 MG tablet Take 3.125 mg by mouth 2 (two) times daily with a meal.   12/29/2023   furosemide  (LASIX ) 40 MG tablet Take 1 tablet (40 mg total) by mouth 2 (two) times daily. 30 tablet 0 12/29/2023   lactulose  (CHRONULAC ) 10 GM/15ML solution Take 30 mLs (20 g total) by mouth daily. (Patient taking differently: Take 20 g by mouth 2 (  two) times daily as needed for moderate constipation.) 236 mL 0 Past Week   Multiple Vitamin (MULTIVITAMIN PO) Take 1 tablet by mouth daily.   12/29/2023   naltrexone  (DEPADE) 50 MG tablet Take 50 mg by mouth daily.   12/29/2023   rifaximin  (XIFAXAN ) 550 MG TABS tablet Take 550 mg by mouth 2 (two) times daily.   12/29/2023   spironolactone  (ALDACTONE ) 100 MG tablet Take 1 tablet (100 mg total) by mouth daily. 30 tablet 0 12/29/2023   Scheduled:  dextromethorphan -guaiFENesin   1 tablet Oral BID   furosemide   40 mg Oral BID   lactulose   20 g Oral Daily    pantoprazole  (PROTONIX ) IV  40 mg Intravenous Q12H   spironolactone   150 mg Oral Daily   Continuous:  albumin  human 50 g (01/01/24 1241)   azithromycin  500 mg (01/01/24 0243)   cefTRIAXone  (ROCEPHIN )  IV 2 g (01/01/24 0815)   PRN:ondansetron  **OR** ondansetron  (ZOFRAN ) IV, oxyCODONE   No Known Allergies   ROS:  A comprehensive review of systems was negative except for: Respiratory: positive for right  sided effusions Gastrointestinal: positive for abd pain, cirrhosis  Blood pressure 120/71, pulse 86, temperature 98.2 F (36.8 C), resp. rate 16, height 5' 10 (1.778 m), weight 83.5 kg, SpO2 95%. Physical Exam Vitals reviewed.  HENT:     Head: Normocephalic.  Cardiovascular:     Rate and Rhythm: Normal rate.  Pulmonary:     Effort: Pulmonary effort is normal.  Abdominal:     Comments: Distended   Skin:    General: Skin is warm.  Neurological:     General: No focal deficit present.     Mental Status: He is alert and oriented to person, place, and time.  Psychiatric:        Mood and Affect: Mood normal.     Results: Results for orders placed or performed during the hospital encounter of 12/29/23 (from the past 48 hours)  Basic metabolic panel with GFR     Status: Abnormal   Collection Time: 12/31/23  2:32 AM  Result Value Ref Range   Sodium 134 (L) 135 - 145 mmol/L   Potassium 3.3 (L) 3.5 - 5.1 mmol/L   Chloride 98 98 - 111 mmol/L   CO2 29 22 - 32 mmol/L   Glucose, Bld 86 70 - 99 mg/dL    Comment: Glucose reference range applies only to samples taken after fasting for at least 8 hours.   BUN 9 6 - 20 mg/dL   Creatinine, Ser 9.32 0.61 - 1.24 mg/dL   Calcium  7.6 (L) 8.9 - 10.3 mg/dL   GFR, Estimated >39 >39 mL/min    Comment: (NOTE) Calculated using the CKD-EPI Creatinine Equation (2021)    Anion gap 8 5 - 15    Comment: Performed at [facility], [address] [place], [place] [address]  Magnesium      Status: None   Collection Time: 12/31/23  2:32 AM  Result  Value Ref Range   Magnesium  1.7 1.7 - 2.4 mg/dL    Comment: Performed at [facility], [address] [place], [place] [address]  CBC     Status: Abnormal   Collection Time: 12/31/23  2:32 AM  Result Value Ref Range   WBC 1.2 (LL) 4.0 - 10.5 K/uL    Comment: REPEATED TO VERIFY WHITE COUNT CONFIRMED ON SMEAR This critical result has been called to STONE,S by Warren Acres on 12/31/2023 04:01:09, and has been read back.    RBC 2.32 (  L) 4.22 - 5.81 MIL/uL   Hemoglobin 7.0 (L) 13.0 - 17.0 g/dL   HCT 78.5 (L) 60.9 - 47.9 %   MCV 92.2 80.0 - 100.0 fL   MCH 30.2 26.0 - 34.0 pg   MCHC 32.7 30.0 - 36.0 g/dL   RDW 81.0 (H) 88.4 - 84.4 %   Platelets 18 (LL) 150 - 400 K/uL    Comment: SPECIMEN CHECKED FOR CLOTS REPEATED TO VERIFY PLATELET COUNT CONFIRMED BY SMEAR This critical result has been called to STONE,S by Warren Acres on 12/31/2023 04:01:09, and has been read back.    nRBC 0.0 0.0 - 0.2 %    Comment: Performed at [facility], [address] [place], [place] [address]  Prepare platelet pheresis     Status: None   Collection Time: 12/31/23  7:11 AM  Result Value Ref Range   Unit Number [id number]    Blood Component Type PLTP2 PSORALEN TREATED    Unit division 00    Status of Unit ISSUED,FINAL    Transfusion Status      OK TO TRANSFUSE Performed at [facility], [address] [place], [place] [address]   Prepare RBC (crossmatch)     Status: None   Collection Time: 12/31/23  7:14 AM  Result Value Ref Range   Order Confirmation      ORDER PROCESSED BY BLOOD BANK Performed at [facility], [address], [place], [place] [address]   Protime-INR     Status: Abnormal   Collection Time: 12/31/23  7:29 AM  Result Value Ref Range   Prothrombin Time 18.7 (H) 11.4 - 15.2 seconds   INR 1.5 (H) 0.8 - 1.2    Comment: (NOTE) INR goal varies based on device and disease states. Performed at [facility], [address] [place], [place] [address]   Hepatic function panel     Status:  Abnormal   Collection Time: 12/31/23  7:29 AM  Result Value Ref Range   Total Protein 6.0 (L) 6.5 - 8.1 g/dL   Albumin  2.9 (L) 3.5 - 5.0 g/dL   AST 61 (H) 15 - 41 U/L   ALT 29 0 - 44 U/L   Alkaline Phosphatase 90 38 - 126 U/L   Total Bilirubin 3.0 (H) 0.0 - 1.2 mg/dL   Bilirubin, Direct 1.6 (H) 0.0 - 0.2 mg/dL   Indirect Bilirubin 1.4 (H) 0.3 - 0.9 mg/dL    Comment: Performed at [facility], [address] [place], [place] [address]  CBC     Status: Abnormal   Collection Time: 12/31/23  3:54 PM  Result Value Ref Range   WBC 1.3 (LL) 4.0 - 10.5 K/uL    Comment: REPEATED TO VERIFY This critical result has been called to K. GANOE by Ulla Echevaria on 12/31/2023 16:42:00, and has been read back.    RBC 2.42 (L) 4.22 - 5.81 MIL/uL   Hemoglobin 7.4 (L) 13.0 - 17.0 g/dL   HCT 77.6 (L) 60.9 - 47.9 %   MCV 92.1 80.0 - 100.0 fL   MCH 30.6 26.0 - 34.0 pg   MCHC 33.2 30.0 - 36.0 g/dL   RDW 81.5 (H) 88.4 - 84.4 %   Platelets 22 (LL) 150 - 400 K/uL    Comment: SPECIMEN CHECKED FOR CLOTS PLATELET COUNT CONFIRMED BY SMEAR REPEATED TO VERIFY Immature Platelet Fraction may be clinically indicated, consider ordering this additional test [id number]    nRBC 0.0 0.0 - 0.2 %    Comment: Performed at [facility], [address] [place], [place] [address]  Prepare RBC (crossmatch)     Status: None   Collection Time: 12/31/23  3:54 PM  Result Value Ref Range   Order Confirmation      ORDER PROCESSED BY BLOOD BANK Performed at [facility], [address] [place], [place] [address]   Prepare platelet pheresis     Status: None   Collection Time: 12/31/23  3:54 PM  Result Value Ref Range   Unit Number [id number]    Blood Component Type PLTP2 PSORALEN TREATED    Unit division 00    Status of Unit ISSUED,FINAL    Transfusion Status      OK TO TRANSFUSE Performed at [facility], [address], [place], [place] [address]   Comprehensive metabolic panel     Status: Abnormal   Collection Time:  01/01/24  5:04 AM  Result Value Ref Range   Sodium 133 (L) 135 - 145 mmol/L   Potassium 3.5 3.5 - 5.1 mmol/L   Chloride 97 (L) 98 - 111 mmol/L   CO2 27 22 - 32 mmol/L   Glucose, Bld 84 70 - 99 mg/dL    Comment: Glucose reference range applies only to samples taken after fasting for at least 8 hours.   BUN 9 6 - 20 mg/dL   Creatinine, Ser 9.28 0.61 - 1.24 mg/dL   Calcium  7.9 (L) 8.9 - 10.3 mg/dL   Total Protein 6.4 (L) 6.5 - 8.1 g/dL   Albumin  3.6 3.5 - 5.0 g/dL   AST 42 (H) 15 - 41 U/L   ALT 22 0 - 44 U/L   Alkaline Phosphatase 84 38 - 126 U/L   Total Bilirubin 2.5 (H) 0.0 - 1.2 mg/dL   GFR, Estimated >39 >39 mL/min    Comment: (NOTE) Calculated using the CKD-EPI Creatinine Equation (2021)    Anion gap 9 5 - 15    Comment: Performed at [facility], [address], [place], [place] [address]  Protime-INR     Status: Abnormal   Collection Time: 01/01/24  5:04 AM  Result Value Ref Range   Prothrombin Time 20.3 (H) 11.4 - 15.2 seconds   INR 1.6 (H) 0.8 - 1.2    Comment: (NOTE) INR goal varies based on device and disease states. Performed at [facility], [address] [place], [place] [address]   CBC     Status: Abnormal   Collection Time: 01/01/24  5:04 AM  Result Value Ref Range   WBC 1.9 (L) 4.0 - 10.5 K/uL   RBC 2.61 (L) 4.22 - 5.81 MIL/uL   Hemoglobin 8.1 (L) 13.0 - 17.0 g/dL   HCT 75.7 (L) 60.9 - 47.9 %   MCV 92.7 80.0 - 100.0 fL   MCH 31.0 26.0 - 34.0 pg   MCHC 33.5 30.0 - 36.0 g/dL   RDW 82.2 (H) 88.4 - 84.4 %   Platelets 26 (LL) 150 - 400 K/uL    Comment: CRITICAL VALUE NOTED.  VALUE IS CONSISTENT WITH PREVIOUSLY REPORTED AND CALLED VALUE. SPECIMEN CHECKED FOR CLOTS Immature Platelet Fraction may be clinically indicated, consider ordering this additional test [id number]    nRBC 0.0 0.0 - 0.2 %    Comment: Performed at [facility], [address] [place], [place] [address]   Personally reviewed lateral decubitus and prior CXR-effusion and no pneumothorax  DG  Chest Left Decubitus Result Date: 01/01/2024 CLINICAL DATA:  Right pleural effusion. Evaluate for hydropneumothorax. EXAM: CHEST - LEFT DECUBITUS- 1-VIEW COMPARISON:  Prior today FINDINGS: Left lateral decubitus film shows large right pleural effusion, however, no right-sided pneumothorax component is seen. IMPRESSION: Large right pleural effusion. No pneumothorax visualized. Electronically Signed   By: Norleen DELENA Kil M.D.   On: 01/01/2024 10:24   DG CHEST PORT 1 VIEW Result Date: 01/01/2024 CLINICAL DATA:  Pleural effusion.  Recent thoracentesis. EXAM: PORTABLE CHEST 1 VIEW COMPARISON:  12/30/2023 FINDINGS: The heart size and mediastinal contours are within normal limits. A new large right pleural effusion is seen with compressive atelectasis of the right lung. No pneumothorax visualized, and no No evidence of mediastinal shift. Left lung remains clear. IMPRESSION: New large right pleural effusion with compressive atelectasis of the right lung. Electronically Signed   By: Norleen DELENA Kil M.D.   On: 01/01/2024 09:25   ECHOCARDIOGRAM COMPLETE Result Date: 12/31/2023    ECHOCARDIOGRAM REPORT   Patient Name:   Robert Sellers Date of Exam: 12/30/2023 Medical Rec #:  [id number]     Height:       70.0 in Accession #:    [id number]    Weight:       184.1 lb Date of Birth:  1980-06-17    BSA:          2.015 m Patient Age:    42 years      BP:           116/70 mmHg Patient Gender: M             HR:           85 bpm. Exam Location:  [facility] Procedure: 2D Echo, Cardiac Doppler and Color Doppler (Both Spectral and Color            Flow Doppler were utilized during procedure). Indications:    Dyspnea R06.00  History:        Patient has no prior history of Echocardiogram examinations.                 Risk Factors:Hypertension and Dyslipidemia. Hx of ETOH abuse,                 Anasarca and Pleaural Effusion.  Sonographer:    Aida Pizza RCS Referring Phys: [phone] Lawson TAT IMPRESSIONS  1. Left ventricular ejection  fraction, by estimation, is 65 to 70%. The left ventricle has normal function. The left ventricle has no regional wall motion abnormalities. There is mild left ventricular hypertrophy. Left ventricular diastolic parameters were normal.  2. Right ventricular systolic function is normal. The right ventricular size is normal. Tricuspid regurgitation signal is inadequate for assessing PA pressure.  3. Left atrial size was moderately dilated.  4. Right atrial size was moderately dilated.  5. The mitral valve is grossly normal. Trivial mitral valve regurgitation.  6. The aortic valve is tricuspid. Aortic valve regurgitation is not visualized. Aortic valve mean gradient measures 11.0 mmHg.  7. Aortic dilatation noted. There is mild dilatation of the aortic root, measuring 41 mm.  8. The inferior vena cava is normal in size with greater than 50% respiratory variability, suggesting right atrial pressure of 3 mmHg.  9. Increased flow velocities may be secondary to anemia, thyrotoxicosis, hyperdynamic or high flow state. Comparison(s): No prior Echocardiogram. FINDINGS  Left Ventricle: Left ventricular ejection fraction, by estimation, is 65 to 70%. The left ventricle has normal function. The left ventricle has no regional wall motion abnormalities. The left ventricular internal cavity size was normal in size. There is  mild left ventricular hypertrophy. Left  ventricular diastolic parameters were normal. Right Ventricle: The right ventricular size is normal. No increase in right ventricular wall thickness. Right ventricular systolic function is normal. Tricuspid regurgitation signal is inadequate for assessing PA pressure. Left Atrium: Left atrial size was moderately dilated. Right Atrium: Right atrial size was moderately dilated. Pericardium: Trivial pericardial effusion is present. The pericardial effusion is circumferential. Mitral Valve: The mitral valve is grossly normal. Trivial mitral valve regurgitation. Tricuspid  Valve: The tricuspid valve is grossly normal. Tricuspid valve regurgitation is trivial. Aortic Valve: The aortic valve is tricuspid. Aortic valve regurgitation is not visualized. Aortic valve mean gradient measures 11.0 mmHg. Aortic valve peak gradient measures 21.0 mmHg. Aortic valve area, by VTI measures 3.95 cm. Pulmonic Valve: The pulmonic valve was normal in structure. Pulmonic valve regurgitation is not visualized. Aorta: Aortic dilatation noted. There is mild dilatation of the aortic root, measuring 41 mm. Venous: The inferior vena cava is normal in size with greater than 50% respiratory variability, suggesting right atrial pressure of 3 mmHg. IAS/Shunts: No atrial level shunt detected by color flow Doppler.  LEFT VENTRICLE PLAX 2D LVIDd:         5.50 cm   Diastology LVIDs:         2.40 cm   LV e' medial:    13.80 cm/s LV PW:         1.00 cm   LV E/e' medial:  10.9 LV IVS:        1.20 cm   LV e' lateral:   14.90 cm/s LVOT diam:     2.40 cm   LV E/e' lateral: 10.1 LV SV:         167 LV SV Index:   83 LVOT Area:     4.52 cm  RIGHT VENTRICLE RV S prime:     18.90 cm/s TAPSE (M-mode): 3.5 cm LEFT ATRIUM             Index        RIGHT ATRIUM           Index LA diam:        4.20 cm 2.08 cm/m   RA Area:     20.40 cm LA Vol (A2C):   94.5 ml 46.90 ml/m  RA Volume:   63.50 ml  31.51 ml/m LA Vol (A4C):   89.9 ml 44.61 ml/m LA Biplane Vol: 91.7 ml 45.51 ml/m  AORTIC VALVE AV Area (Vmax):    4.19 cm AV Area (Vmean):   3.36 cm AV Area (VTI):     3.95 cm AV Vmax:           229.00 cm/s AV Vmean:          155.000 cm/s AV VTI:            0.424 m AV Peak Grad:      21.0 mmHg AV Mean Grad:      11.0 mmHg LVOT Vmax:         212.00 cm/s LVOT Vmean:        115.000 cm/s LVOT VTI:          0.370 m LVOT/AV VTI ratio: 0.87  AORTA Ao Root diam: 4.10 cm MITRAL VALVE MV Area (PHT): 2.56 cm     SHUNTS MV Decel Time: 296 msec     Systemic VTI:  0.37 m MV E velocity: 151.00 cm/s  Systemic Diam: 2.40 cm MV A velocity: 99.60 cm/s  MV E/A ratio:  1.52 Vinie Maxcy MD Electronically signed  by Vinie Maxcy MD Signature Date/Time: 12/31/2023/12:29:35 PM    Final      Assessment & Plan:  Robert Sellers is a 43 y.o. male with concern for pneumothorax but repeat imaging without any pneumothorax. I discussed the findings with DR. Tat and the patient. During this, the original CXR was read and radiology agreed no Pneumothorax.   No chest tube indicated.  All questions were answered to the satisfaction of the patient.    Manuelita JAYSON Pander 01/01/2024, 12:50 PM

## 2024-01-01 NOTE — Discharge Summary (Signed)
 Physician Discharge Summary   Patient: Robert Sellers MRN: [id number] DOB: 1980-05-29  Admit date:     12/29/2023  Discharge date: 01/02/2024  Discharge Physician: Alm Baran Kuhrt   PCP: Vincente Shivers, NP   Recommendations at discharge:   Please follow up with primary care provider within 1-2 weeks  Please repeat BMP and CBC in one week     Hospital Course: 43 year old male with a history of alcoholic liver cirrhosis, hepatic hydrothorax, portal hypertension with esophageal varices, alcohol abuse presenting with shortness of breath for about a week.  He states that worsened significantly on the evening of 12/29/2023.  He endorsed a fever of 103.0 F at home on 12/29/2023.  He denies headache, coughing, hemoptysis, nausea, vomiting, diarrhea, abdominal pain.  He did have some chest discomfort substernal yesterday.  He denies any worsening lower extremity edema.  He does feel that his abdomen is little bit distended.  He endorses compliance with all his medications.  He states that he has not drank any alcohol since his last hospitalization in Oct 2025.  He denies any tobacco or illicit drug use.  He states that he continues to have intermittent epistaxis.  In the ED, the patient had a low-grade temperature of 99.2 F.  He was hemodynamically stable with oxygen saturation 92-94% on room air.  He was placed on 2 L with saturation up to 100%.  CTA chest was negative for PE.  It showed a large right pleural effusion with atelectasis.  There was dilated pulmonary artery up to 3.600 m.  There was a cirrhotic liver with splenomegaly.  The patient was admitted for further evaluation and treatment of his pleural effusion and dyspnea.  The patient had a recent hospitalization from 11/11/2023 to 11/16/2023. During that hospitalization, the patient was seen by GI for decompensated liver cirrhosis with worsening ascites and hepatic hydrothorax.  He was seen by GI who increased his Lasix  to 40 mg twice daily and  spironolactone  100 mg daily.  His Coreg  was held in part due to soft blood pressures he was instructed to continue lactulose .  He was seen by pulmonary, and he underwent thoracocentesis of his right pleural effusion removing 2 L of fluid.  He was felt to be consistent with a hepatic hydrothorax.  He was noted to have recurrent epistaxis secondary to his thrombocytopenia.  He was transfused 3 units of platelets and 1 unit PRBC during the hospitalization.  He also had hematuria during his last hospitalization.  Urology was contacted and felt the patient was appropriate for outpatient follow-up as his CT abdomen did not show any ureteral stones or acute GU abnormalities.  The patient underwent thoracocentesis on 1128 with 2.4 L removed.  His respiratory status improved.  Unfortunately, his pleural effusion reaccumulated in a matter of 48 hours.  He underwent repeat thoracocentesis on 01/02/2024 removing 1.4 L.  He remained stable on room air.  He was ambulated on room air without desaturation at the time of discharge.  GI was consulted.  They felt the patient would benefit from a TIPS procedure.  They do not recommend any endoscopy at this time.  Pulmonary medicine was consulted for his recurrent hepatic hydrothorax.  They also recommended the patient pursue TIPS procedure.  [facility] was contacted.  I spoke with hepatology, Dr. Maree.  He felt that the patient would be appropriate for TIPS procedure and would except a transfer; however, there were no beds available and the waiting list was not an option.  As result, the patient underwent repeat thoracocentesis on 01/02/2024.  He remained stable on room air and was discharged to follow-up with his hepatologist at [facility].  Assessment and Plan:  Decompensated alcohol liver cirrhosis - Presenting with recurrent right pleural effusion and ascites - Patient endorses compliance with his medications - Continue furosemide  and spironolactone  - 12/30/23  MELD 3. 0 = 11 - 01/01/24 MELD 3.0 = 17 - Request paracentesis--not enough fluid for para - increase lasix  to 80 mg in AM, 40 mg PM and spirolactone 200 mg--this was recommended by hepatology, Dr. Maree   Recurrent right small effusion--transudate - 11/28 thoracocentesis--2.4L removed - Sent for cell count and chemistries - transudate by Light's criteria - Secondary to hepatic hydrothorax -11/30--initially concerned about pneumothorax on follow up CXR--discussed with radiology, Dr. Tammi PTX - 11/30 CXR--recurrent right pleural effusion--reaccumulated - 11/30--discussed with genera surgery-Dr. Loyola PTX - 11/30--discussed with pulmonary--Dr. Desai>>needs TIPS -11/30--I called [facility] hepatology--spoke with Dr. Maree regarding transfer for TIPS>>no beds available, no wait list available -repeat thora 01/02/24--1.4 L removed>>instructed pt to follow up with [facility] hepatology for TIPS -His spironolactone  was increased to 150 mg daily.  He was continued on furosemide  40 mg twice daily.  Sodium 139, serum creatinine 0.76 on day of discharge.   Pancytopenia - Secondary to liver cirrhosis - iron  saturation 16, ferritin 40 - venofer  200 mg x 1 given on 11/30 - Folic acid --19.2 - B12--849 - counts drifted down in part due to dilution - transfused 2 units PRBC and 2 units platelets - GI consult appreciated - give 3 units platelets for the hospitalization -Platelets 27,000 at time of discharge.  Hemoglobin 8.6 at the time of discharge. -He may ultimately need splenic embolization for his recurrent thrombocytopenia.   Fever - UA negative for pyuria - Follow-up blood cultures--neg to date - Check PCT--0.11 - 12/29/2023 CTA chest--as discussed above, not definitive for consolidation - Continued ceftriaxone  and azithromycin --received 4 days - MRSA screen--neg - Check COVID/flu/RSV--neg - afebrile and hemodynamically stable since admission -Blood cultures, urine cultures, and pleural fluid  cultures were all negative he defervesced and remained hemodynamically stable.   Microcytic anemia - iron  saturation 16 - ferritin 40 -B12 849 -folate 19.2 -Venofer  200 mg IV given x 1   Hypomagnesemia/hypokalemia  -repleted   Hyponatremia -due to liver cirrhosis   Atypical chest pain -troponin neg x 2 -12/30/23 Echo EF 65-70%, no WMA, normal RVF   THC use -cessation discussed       Consultants: GI, pulm Procedures performed: thora x 2  Disposition: Home Diet recommendation:  2 gram Na DISCHARGE MEDICATION: Allergies as of 01/02/2024   No Known Allergies      Medication List     TAKE these medications    carvedilol  3.125 MG tablet Commonly known as: COREG  Take 3.125 mg by mouth 2 (two) times daily with a meal.   furosemide  40 MG tablet Commonly known as: LASIX  Take 1 tablet (40 mg total) by mouth 2 (two) times daily.   lactulose  10 GM/15ML solution Commonly known as: CHRONULAC  Take 30 mLs (20 g total) by mouth daily. What changed:  when to take this reasons to take this   MULTIVITAMIN PO Take 1 tablet by mouth daily.   naltrexone  50 MG tablet Commonly known as: DEPADE Take 50 mg by mouth daily.   rifaximin  550 MG Tabs tablet Commonly known as: XIFAXAN  Take 550 mg by mouth 2 (two) times daily.   spironolactone  100 MG tablet Commonly known as:  ALDACTONE  Take 1.5 tablets (150 mg total) by mouth daily. Start taking on: January 03, 2024 What changed: how much to take        Discharge Exam: Filed Weights   12/29/23 2355  Weight: 83.5 kg   HEENT:  [place]/AT, No thrush, no icterus CV:  RRR, no rub, no S3, no S4 Lung:  bibasilar crackles.  Diminished BS on right Abd:  soft/+BS, NT Ext:  No edema, no lymphangitis, no synovitis, no rash   Condition at discharge: stable  The results of significant diagnostics from this hospitalization (including imaging, microbiology, ancillary and laboratory) are listed below for reference.   Imaging  Studies: DG Chest 2 View Result Date: 01/02/2024 EXAM: 2 VIEW(S) XRAY OF THE CHEST 01/02/2024 07:38:00 AM COMPARISON: 01/01/2024 CLINICAL HISTORY: Pleural effusion FINDINGS: LUNGS AND PLEURA: Moderate right pleural effusion, unchanged from prior exam. Hazy right lung opacities, unchanged from prior exam. No pneumothorax. HEART AND MEDIASTINUM: No acute abnormality of the cardiac and mediastinal silhouettes. BONES AND SOFT TISSUES: No acute osseous abnormality. IMPRESSION: 1. Moderate right pleural effusion. 2. Hazy right lung opacities. Electronically signed by: Katheleen Faes MD 01/02/2024 08:44 AM EST RP Workstation: [id number]   DG Chest 2 View Result Date: 01/01/2024 CLINICAL DATA:  Pleural effusion EXAM: CHEST - 2 VIEW COMPARISON:  Chest radiograph dated 01/01/2024 FINDINGS: Asymmetrically lower right lung volumes. Diffuse hazy and linear right lung opacities. Interval redistribution of moderate to large right pleural effusion. No pneumothorax. Right heart border is obscured. No acute osseous abnormality. IMPRESSION: 1. Interval redistribution of moderate to large right pleural effusion. 2. Diffuse hazy and linear right lung opacities, likely atelectasis. Electronically Signed   By: Limin  Xu M.D.   On: 01/01/2024 15:44   DG Chest Left Decubitus Result Date: 01/01/2024 CLINICAL DATA:  Right pleural effusion. Evaluate for hydropneumothorax. EXAM: CHEST - LEFT DECUBITUS- 1-VIEW COMPARISON:  Prior today FINDINGS: Left lateral decubitus film shows large right pleural effusion, however, no right-sided pneumothorax component is seen. IMPRESSION: Large right pleural effusion. No pneumothorax visualized. Electronically Signed   By: Norleen DELENA Kil M.D.   On: 01/01/2024 10:24   DG CHEST PORT 1 VIEW Result Date: 01/01/2024 CLINICAL DATA:  Pleural effusion.  Recent thoracentesis. EXAM: PORTABLE CHEST 1 VIEW COMPARISON:  12/30/2023 FINDINGS: The heart size and mediastinal contours are within normal limits. A new  large right pleural effusion is seen with compressive atelectasis of the right lung. No pneumothorax visualized, and no No evidence of mediastinal shift. Left lung remains clear. IMPRESSION: New large right pleural effusion with compressive atelectasis of the right lung. Electronically Signed   By: Norleen DELENA Kil M.D.   On: 01/01/2024 09:25   ECHOCARDIOGRAM COMPLETE Result Date: 12/31/2023    ECHOCARDIOGRAM REPORT   Patient Name:   Crystalynn Mcinerney GIAN YBARRA Date of Exam: 12/30/2023 Medical Rec #:  [id number]     Height:       70.0 in Accession #:    [id number]    Weight:       184.1 lb Date of Birth:  08-07-1980    BSA:          2.015 m Patient Age:    42 years      BP:           116/70 mmHg Patient Gender: M             HR:           85 bpm. Exam Location:  [facility] Procedure: 2D Echo, Cardiac Doppler  and Color Doppler (Both Spectral and Color            Flow Doppler were utilized during procedure). Indications:    Dyspnea R06.00  History:        Patient has no prior history of Echocardiogram examinations.                 Risk Factors:Hypertension and Dyslipidemia. Hx of ETOH abuse,                 Anasarca and Pleaural Effusion.  Sonographer:    Aida Pizza RCS Referring Phys: [phone] Larah Kuntzman IMPRESSIONS  1. Left ventricular ejection fraction, by estimation, is 65 to 70%. The left ventricle has normal function. The left ventricle has no regional wall motion abnormalities. There is mild left ventricular hypertrophy. Left ventricular diastolic parameters were normal.  2. Right ventricular systolic function is normal. The right ventricular size is normal. Tricuspid regurgitation signal is inadequate for assessing PA pressure.  3. Left atrial size was moderately dilated.  4. Right atrial size was moderately dilated.  5. The mitral valve is grossly normal. Trivial mitral valve regurgitation.  6. The aortic valve is tricuspid. Aortic valve regurgitation is not visualized. Aortic valve mean gradient measures 11.0 mmHg.  7.  Aortic dilatation noted. There is mild dilatation of the aortic root, measuring 41 mm.  8. The inferior vena cava is normal in size with greater than 50% respiratory variability, suggesting right atrial pressure of 3 mmHg.  9. Increased flow velocities may be secondary to anemia, thyrotoxicosis, hyperdynamic or high flow state. Comparison(s): No prior Echocardiogram. FINDINGS  Left Ventricle: Left ventricular ejection fraction, by estimation, is 65 to 70%. The left ventricle has normal function. The left ventricle has no regional wall motion abnormalities. The left ventricular internal cavity size was normal in size. There is  mild left ventricular hypertrophy. Left ventricular diastolic parameters were normal. Right Ventricle: The right ventricular size is normal. No increase in right ventricular wall thickness. Right ventricular systolic function is normal. Tricuspid regurgitation signal is inadequate for assessing PA pressure. Left Atrium: Left atrial size was moderately dilated. Right Atrium: Right atrial size was moderately dilated. Pericardium: Trivial pericardial effusion is present. The pericardial effusion is circumferential. Mitral Valve: The mitral valve is grossly normal. Trivial mitral valve regurgitation. Tricuspid Valve: The tricuspid valve is grossly normal. Tricuspid valve regurgitation is trivial. Aortic Valve: The aortic valve is tricuspid. Aortic valve regurgitation is not visualized. Aortic valve mean gradient measures 11.0 mmHg. Aortic valve peak gradient measures 21.0 mmHg. Aortic valve area, by VTI measures 3.95 cm. Pulmonic Valve: The pulmonic valve was normal in structure. Pulmonic valve regurgitation is not visualized. Aorta: Aortic dilatation noted. There is mild dilatation of the aortic root, measuring 41 mm. Venous: The inferior vena cava is normal in size with greater than 50% respiratory variability, suggesting right atrial pressure of 3 mmHg. IAS/Shunts: No atrial level shunt  detected by color flow Doppler.  LEFT VENTRICLE PLAX 2D LVIDd:         5.50 cm   Diastology LVIDs:         2.40 cm   LV e' medial:    13.80 cm/s LV PW:         1.00 cm   LV E/e' medial:  10.9 LV IVS:        1.20 cm   LV e' lateral:   14.90 cm/s LVOT diam:     2.40 cm   LV E/e' lateral: 10.1  LV SV:         167 LV SV Index:   83 LVOT Area:     4.52 cm  RIGHT VENTRICLE RV S prime:     18.90 cm/s TAPSE (M-mode): 3.5 cm LEFT ATRIUM             Index        RIGHT ATRIUM           Index LA diam:        4.20 cm 2.08 cm/m   RA Area:     20.40 cm LA Vol (A2C):   94.5 ml 46.90 ml/m  RA Volume:   63.50 ml  31.51 ml/m LA Vol (A4C):   89.9 ml 44.61 ml/m LA Biplane Vol: 91.7 ml 45.51 ml/m  AORTIC VALVE AV Area (Vmax):    4.19 cm AV Area (Vmean):   3.36 cm AV Area (VTI):     3.95 cm AV Vmax:           229.00 cm/s AV Vmean:          155.000 cm/s AV VTI:            0.424 m AV Peak Grad:      21.0 mmHg AV Mean Grad:      11.0 mmHg LVOT Vmax:         212.00 cm/s LVOT Vmean:        115.000 cm/s LVOT VTI:          0.370 m LVOT/AV VTI ratio: 0.87  AORTA Ao Root diam: 4.10 cm MITRAL VALVE MV Area (PHT): 2.56 cm     SHUNTS MV Decel Time: 296 msec     Systemic VTI:  0.37 m MV E velocity: 151.00 cm/s  Systemic Diam: 2.40 cm MV A velocity: 99.60 cm/s MV E/A ratio:  1.52 Vinie Maxcy MD Electronically signed by Vinie Maxcy MD Signature Date/Time: 12/31/2023/12:29:35 PM    Final    US  THORACENTESIS ASP PLEURAL SPACE W/IMG GUIDE Result Date: 12/30/2023 INDICATION: 43 year old male with history of recurrent hydrothorax. Request for diagnostic and therapeutic thoracentesis. EXAM: ULTRASOUND GUIDED RIGHT THORACENTESIS MEDICATIONS: 8 mL 1% lidocaine  COMPLICATIONS: None immediate. PROCEDURE: An ultrasound guided thoracentesis was thoroughly discussed with the patient and questions answered. The benefits, risks, alternatives and complications were also discussed. The patient understands and wishes to proceed with the procedure.  Written consent was obtained. Ultrasound was performed to localize and mark an adequate pocket of fluid in the right chest. The area was then prepped and draped in the normal sterile fashion. 1% Lidocaine  was used for local anesthesia. Under ultrasound guidance a 6 Fr Safe-T-Centesis catheter was introduced. Thoracentesis was performed. The catheter was removed and a dressing applied. FINDINGS: A total of approximately 2.4 liters of clear, yellow fluid was removed. Samples were sent to the laboratory as requested by the clinical team. IMPRESSION: Successful ultrasound guided right thoracentesis yielding 2.4 liters of pleural fluid. Performed by: Kacie Matthews PA-C Electronically Signed   By: Juliene Balder M.D.   On: 12/30/2023 11:06   US  ASCITES (ABDOMEN LIMITED) Result Date: 12/30/2023 EXAM: LIMITED ABDOMINAL ULTRASOUND FOR ASCITES EVALUATION TECHNIQUE: Limited real-time sonography of all 4 quadrants of the abdomen was performed for evaluation of ascites. COMPARISON: US  Abdomen 11/14/2023. CLINICAL HISTORY: Alcoholic cirrhosis of liver with ascites (HCC). FINDINGS: RIGHT UPPER QUADRANT: Small volume perihepatic ascites measuring 2.8 cm maximum vertical pocket depth. LEFT UPPER QUADRANT: No appreciable ascites. RIGHT LOWER QUADRANT: No appreciable ascites. LEFT LOWER  QUADRANT: No appreciable ascites. OTHER: No appreciable ascites in the midline. Limited visualization of the rest of the abdomen demonstrates no acute abnormality. IMPRESSION: 1. Small volume perihepatic ascites, below threshold to perform paracentesis. 2. No appreciable ascites in the right lower quadrant, midline, or left abdomen. Electronically signed by: Selinda Blue MD 12/30/2023 10:55 AM EST RP Workstation: [id number]   DG Chest 1 View Result Date: 12/30/2023 CLINICAL DATA:  [id number] Status post thoracentesis [id number] EXAM: CHEST  1 VIEW COMPARISON:  12/30/2023 12 a.m. FINDINGS: Lower lung volumes with bronchovascular crowding. No new airspace  consolidation. Near-complete resolution of the right pleural effusion. No pneumothorax. No cardiomegaly.No acute fracture or destructive lesion. IMPRESSION: Near-complete resolution of the right pleural effusion. No pneumothorax. Electronically Signed   By: Rogelia Myers M.D.   On: 12/30/2023 10:17   CT Angio Chest PE W and/or Wo Contrast Result Date: 12/30/2023 EXAM: CTA CHEST 12/30/2023 05:10:24 AM TECHNIQUE: CTA of the chest was performed after the administration of intravenous contrast. Multiplanar reformatted images are provided for review. MIP images are provided for review. Automated exposure control, iterative reconstruction, and/or weight based adjustment of the mA/kV was utilized to reduce the radiation dose to as low as reasonably achievable. COMPARISON: CT of the chest dated 11/11/2023. CLINICAL HISTORY: Pulmonary embolism (PE) suspected, low to intermediate prob, positive D-dimer. FINDINGS: PULMONARY ARTERIES: Pulmonary arteries are adequately opacified for evaluation. No acute pulmonary embolus. Main pulmonary artery is dilated, measuring approximately 3.6 cm in diameter. MEDIASTINUM: The heart and pericardium demonstrate no acute abnormality. There is no acute abnormality of the thoracic aorta. LYMPH NODES: No mediastinal, hilar or axillary lymphadenopathy. LUNGS AND PLEURA: There is a large right-sided pleural effusion present. There is passive atelectasis to the right lower lobe. No focal consolidation or pulmonary edema. No pneumothorax. UPPER ABDOMEN: The liver appears cirrhotic. The spleen is moderately enlarged. SOFT TISSUES AND BONES: No acute bone or soft tissue abnormality. IMPRESSION: 1. No evidence of pulmonary embolism. 2. Large right-sided pleural effusion with associated passive atelectasis in the right lower lobe. 3. Dilated main pulmonary artery measuring approximately 3.6 cm in diameter, which can be seen with pulmonary hypertension; correlate clinically. 4. Cirrhotic liver and  moderately enlarged spleen. Electronically signed by: Evalene Coho MD 12/30/2023 05:29 AM EST RP Workstation: [id number]   DG Chest 2 View Result Date: 12/30/2023 EXAM: 2 VIEW(S) XRAY OF THE CHEST 12/30/2023 12:22:02 AM COMPARISON: Comparison with 11/15/2023. CLINICAL HISTORY: SOB. FINDINGS: LUNGS AND PLEURA: Large right pleural effusion with basilar atelectasis or consolidation. This is decreasing in size since the previous study, although possibly due to differences in patient position. Left lung is clear. No pneumothorax. HEART AND MEDIASTINUM: Heart size is normal. Mediastinal contours appear intact. BONES AND SOFT TISSUES: No acute osseous abnormality. IMPRESSION: 1. Large right pleural effusion with basilar atelectasis or consolidation, decreased compared to prior, though positional factors may contribute. Electronically signed by: Elsie Gravely MD 12/30/2023 12:24 AM EST RP Workstation: [id number]    Microbiology: Results for orders placed or performed during the hospital encounter of 12/29/23  Blood Culture (routine x 2)     Status: None (Preliminary result)   Collection Time: 12/30/23  2:33 AM   Specimen: BLOOD  Result Value Ref Range Status   Specimen Description BLOOD BLOOD RIGHT HAND  Final   Special Requests   Final    BOTTLES DRAWN AEROBIC AND ANAEROBIC Blood Culture adequate volume   Culture   Final    NO GROWTH 3 DAYS Performed at [facility],  [address], [place], [place] [address]    Report Status PENDING  Incomplete  Resp panel by RT-PCR (RSV, Flu A&B, Covid) Anterior Nasal Swab     Status: None   Collection Time: 12/30/23  2:33 AM   Specimen: Anterior Nasal Swab  Result Value Ref Range Status   SARS Coronavirus 2 by RT PCR NEGATIVE NEGATIVE Final    Comment: (NOTE) SARS-CoV-2 target nucleic acids are NOT DETECTED.  The SARS-CoV-2 RNA is generally detectable in upper respiratory specimens during the acute phase of infection. The lowest concentration of  SARS-CoV-2 viral copies this assay can detect is 138 copies/mL. A negative result does not preclude SARS-Cov-2 infection and should not be used as the sole basis for treatment or other patient management decisions. A negative result may occur with  improper specimen collection/handling, submission of specimen other than nasopharyngeal swab, presence of viral mutation(s) within the areas targeted by this assay, and inadequate number of viral copies(<138 copies/mL). A negative result must be combined with clinical observations, patient history, and epidemiological information. The expected result is Negative.  Fact Sheet for Patients:  bloggercourse.com  Fact Sheet for Healthcare Providers:  seriousbroker.it  This test is no t yet approved or cleared by the [place]  FDA and  has been authorized for detection and/or diagnosis of SARS-CoV-2 by FDA under an Emergency Use Authorization (EUA). This EUA will remain  in effect (meaning this test can be used) for the duration of the COVID-19 declaration under Section 564(b)(1) of the Act, 21 U.S.C.section 360bbb-3(b)(1), unless the authorization is terminated  or revoked sooner.       Influenza A by PCR NEGATIVE NEGATIVE Final   Influenza B by PCR NEGATIVE NEGATIVE Final    Comment: (NOTE) The Xpert Xpress SARS-CoV-2/FLU/RSV plus assay is intended as an aid in the diagnosis of influenza from Nasopharyngeal swab specimens and should not be used as a sole basis for treatment. Nasal washings and aspirates are unacceptable for Xpert Xpress SARS-CoV-2/FLU/RSV testing.  Fact Sheet for Patients: bloggercourse.com  Fact Sheet for Healthcare Providers: seriousbroker.it  This test is not yet approved or cleared by the [place]  FDA and has been authorized for detection and/or diagnosis of SARS-CoV-2 by FDA under an Emergency Use  Authorization (EUA). This EUA will remain in effect (meaning this test can be used) for the duration of the COVID-19 declaration under Section 564(b)(1) of the Act, 21 U.S.C. section 360bbb-3(b)(1), unless the authorization is terminated or revoked.     Resp Syncytial Virus by PCR NEGATIVE NEGATIVE Final    Comment: (NOTE) Fact Sheet for Patients: bloggercourse.com  Fact Sheet for Healthcare Providers: seriousbroker.it  This test is not yet approved or cleared by the [place]  FDA and has been authorized for detection and/or diagnosis of SARS-CoV-2 by FDA under an Emergency Use Authorization (EUA). This EUA will remain in effect (meaning this test can be used) for the duration of the COVID-19 declaration under Section 564(b)(1) of the Act, 21 U.S.C. section [id number])(1), unless the authorization is terminated or revoked.  Performed at [facility], [address] [place], [place] [address]   Blood Culture (routine x 2)     Status: None (Preliminary result)   Collection Time: 12/30/23  2:35 AM   Specimen: BLOOD  Result Value Ref Range Status   Specimen Description BLOOD BLOOD LEFT HAND  Final   Special Requests   Final    BOTTLES DRAWN AEROBIC AND ANAEROBIC Blood Culture adequate volume   Culture   Final  NO GROWTH 3 DAYS Performed at [facility], [address] [place], [place] [address]    Report Status PENDING  Incomplete  MRSA Next Gen by PCR, Nasal     Status: None   Collection Time: 12/30/23  3:13 AM   Specimen: Nasal Mucosa; Nasal Swab  Result Value Ref Range Status   MRSA by PCR Next Gen NOT DETECTED NOT DETECTED Final    Comment: (NOTE) The GeneXpert MRSA Assay (FDA approved for NASAL specimens only), is one component of a comprehensive MRSA colonization surveillance program. It is not intended to diagnose MRSA infection nor to guide or monitor treatment for MRSA infections. Test performance is not FDA  approved in patients less than 75 years old. Performed at [facility], [address] [place], [place] [address]   Respiratory (~20 pathogens) panel by PCR     Status: None   Collection Time: 12/30/23  7:41 AM   Specimen: Nasopharyngeal Swab; Respiratory  Result Value Ref Range Status   Adenovirus NOT DETECTED NOT DETECTED Final   Coronavirus 229E NOT DETECTED NOT DETECTED Final    Comment: (NOTE) The Coronavirus on the Respiratory Panel, DOES NOT test for the novel  Coronavirus (2019 nCoV)    Coronavirus HKU1 NOT DETECTED NOT DETECTED Final   Coronavirus NL63 NOT DETECTED NOT DETECTED Final   Coronavirus OC43 NOT DETECTED NOT DETECTED Final   Metapneumovirus NOT DETECTED NOT DETECTED Final   Rhinovirus / Enterovirus NOT DETECTED NOT DETECTED Final   Influenza A NOT DETECTED NOT DETECTED Final   Influenza B NOT DETECTED NOT DETECTED Final   Parainfluenza Virus 1 NOT DETECTED NOT DETECTED Final   Parainfluenza Virus 2 NOT DETECTED NOT DETECTED Final   Parainfluenza Virus 3 NOT DETECTED NOT DETECTED Final   Parainfluenza Virus 4 NOT DETECTED NOT DETECTED Final   Respiratory Syncytial Virus NOT DETECTED NOT DETECTED Final   Bordetella pertussis NOT DETECTED NOT DETECTED Final   Bordetella Parapertussis NOT DETECTED NOT DETECTED Final   Chlamydophila pneumoniae NOT DETECTED NOT DETECTED Final   Mycoplasma pneumoniae NOT DETECTED NOT DETECTED Final    Comment: Performed at [facility], [address], [place], [place] [address]  Culture, body fluid w Gram Stain-bottle     Status: None (Preliminary result)   Collection Time: 12/30/23  9:40 AM   Specimen: Pleura  Result Value Ref Range Status   Specimen Description PLEURAL  Final   Special Requests 10CC BOTTLES DRAWN AEROBIC AND ANAEROBIC  Final   Culture   Final    NO GROWTH 3 DAYS Performed at [facility], [address], [place], [place] [address]    Report Status PENDING  Incomplete  Gram stain     Status: None    Collection Time: 12/30/23  9:40 AM   Specimen: Pleura  Result Value Ref Range Status   Specimen Description PLEURAL  Final   Special Requests NONE  Final   Gram Stain   Final    WBC PRESENT, PREDOMINANTLY MONONUCLEAR NO ORGANISMS SEEN CYTOSPIN SMEAR Performed at [facility], [address], [place], [place] [address]    Report Status 12/30/2023 FINAL  Final  Urine Culture (for pregnant, neutropenic or urologic patients or patients with an indwelling urinary catheter)     Status: None   Collection Time: 12/30/23 12:32 PM   Specimen: Urine, Clean Catch  Result Value Ref Range Status   Specimen Description   Final    URINE, CLEAN CATCH Performed at [facility], [address], [place], [place] [address]    Special Requests   Final    NONE Performed at [facility], [address] [place], [place] [address]    Culture   Final    NO GROWTH Performed at [facility], [address], [place], [place] [address]    Report Status 12/31/2023 FINAL  Final    Labs: CBC: Recent Labs  Lab 12/29/23 2358 12/31/23 0232 12/31/23 1554 01/01/24 0504 01/02/24 0345  WBC 2.1* 1.2* 1.3* 1.9* 2.3*  HGB 7.9* 7.0* 7.4* 8.1* 8.6*  HCT 24.1* 21.4* 22.3* 24.2* 26.4*  MCV 92.3 92.2 92.1 92.7 95.3  PLT 30* 18* 22* 26* 27*   Basic Metabolic Panel: Recent Labs  Lab 12/29/23 2358 12/30/23 0810 12/31/23 0232 01/01/24 0504 01/02/24 0345  NA 137  --  134* 133* 139  K 3.6  --  3.3* 3.5 3.7  CL 103  --  98 97* 100  CO2 24  --  29 27 30   GLUCOSE 114*  --  86 84 86  BUN 12  --  9 9 10   CREATININE 0.76  --  0.67 0.71 0.76  CALCIUM  8.1*  --  7.6* 7.9* 8.9  MG  --  1.5* 1.7  --  1.9  PHOS  --  3.6  --   --   --    Liver Function Tests: Recent Labs  Lab 12/29/23 2358 12/31/23 0729 01/01/24 0504 01/02/24 0345  AST 86* 61* 42* 32  ALT 38 29 22 17   ALKPHOS 163* 90 84 77  BILITOT 1.7* 3.0* 2.5* 2.5*  PROT 6.9 6.0* 6.4* 6.9  ALBUMIN  3.4* 2.9* 3.6 4.4   CBG: No results for input(s):  GLUCAP in the last 168 hours.  Discharge time spent: greater than 30 minutes.  Signed: Alm Schneider, MD [facility] 01/02/2024

## 2024-01-01 NOTE — Plan of Care (Signed)
  Problem: Education: Goal: Knowledge of General Education information will improve Description: Including pain rating scale, medication(s)/side effects and non-pharmacologic comfort measures Outcome: Progressing   Problem: Clinical Measurements: Goal: Ability to maintain clinical measurements within normal limits will improve Outcome: Progressing Goal: Will remain free from infection Outcome: Progressing   Problem: Activity: Goal: Risk for activity intolerance will decrease Outcome: Progressing   Problem: Nutrition: Goal: Adequate nutrition will be maintained Outcome: Progressing   Problem: Pain Managment: Goal: General experience of comfort will improve and/or be controlled Outcome: Progressing   Problem: Safety: Goal: Ability to remain free from injury will improve Outcome: Progressing   Problem: Skin Integrity: Goal: Risk for impaired skin integrity will decrease Outcome: Progressing   Problem: Activity: Goal: Ability to tolerate increased activity will improve Outcome: Progressing   Problem: Clinical Measurements: Goal: Ability to maintain a body temperature in the normal range will improve Outcome: Progressing   Problem: Respiratory: Goal: Ability to maintain adequate ventilation will improve Outcome: Progressing

## 2024-01-01 NOTE — Plan of Care (Signed)

## 2024-01-01 NOTE — Progress Notes (Addendum)
 Toribio Fortune, M.D. Gastroenterology & Hepatology   Interval History:  Patient reports feeling slightly better compared to yesterday as his shortness of breath is less but still having abdominal pain in the epigastric area and some abdominal distention.  No melena, hematochezia, hematemesis, nausea, vomiting, fever or chills. Chest x-ray performed today showed presence of new large right pleural effusion with some compressive atelectasis of the right lung.  A repeat x-ray was performed 3 hours later on the left decubitus which showed presence of recurrent effusion on the right side.  Unclear if both x-rays were performed when the patient was laying flat (he believes this was the situation).  Inpatient Medications:  Current Facility-Administered Medications:    azithromycin  (ZITHROMAX ) 500 mg in sodium chloride  0.9 % 250 mL IVPB, 500 mg, Intravenous, Q24H, Midge Golas, MD, Last Rate: 250 mL/hr at 01/01/24 0243, 500 mg at 01/01/24 0243   cefTRIAXone  (ROCEPHIN ) 2 g in sodium chloride  0.9 % 100 mL IVPB, 2 g, Intravenous, Q24H, Adefeso, Oladapo, DO, Last Rate: 200 mL/hr at 01/01/24 0815, 2 g at 01/01/24 0815   dextromethorphan -guaiFENesin  (MUCINEX  DM) 30-600 MG per 12 hr tablet 1 tablet, 1 tablet, Oral, BID, Adefeso, Oladapo, DO, 1 tablet at 01/01/24 0908   furosemide  (LASIX ) tablet 40 mg, 40 mg, Oral, BID, Adefeso, Oladapo, DO, 40 mg at 01/01/24 0908   lactulose  (CHRONULAC ) 10 GM/15ML solution 20 g, 20 g, Oral, Daily, Adefeso, Oladapo, DO, 20 g at 01/01/24 0908   ondansetron  (ZOFRAN ) tablet 4 mg, 4 mg, Oral, Q6H PRN **OR** ondansetron  (ZOFRAN ) injection 4 mg, 4 mg, Intravenous, Q6H PRN, Adefeso, Oladapo, DO, 4 mg at 12/30/23 1215   oxyCODONE  (Oxy IR/ROXICODONE ) immediate release tablet 5 mg, 5 mg, Oral, Q6H PRN, Tat, Serafino, MD, 5 mg at 01/01/24 0908   pantoprazole  (PROTONIX ) injection 40 mg, 40 mg, Intravenous, Q12H, Tat, Alm, MD, 40 mg at 01/01/24 0908   spironolactone  (ALDACTONE ) tablet  150 mg, 150 mg, Oral, Daily, Fortune Flavors, Tanaysha Alkins, MD, 150 mg at 01/01/24 0908   I/O    Intake/Output Summary (Last 24 hours) at 01/01/2024 1017 Last data filed at 01/01/2024 0908 Gross per 24 hour  Intake 2961.52 ml  Output 4040 ml  Net -1078.48 ml     Physical Exam: Temp:  [97.6 F (36.4 C)-99.1 F (37.3 C)] 98.2 F (36.8 C) (11/30 0531) Pulse Rate:  [81-91] 86 (11/30 0025) Resp:  [16-18] 16 (11/30 0531) BP: (118-138)/(58-82) 120/71 (11/30 0531) SpO2:  [90 %-96 %] 95 % (11/30 0531)  Temp (24hrs), Avg:98.4 F (36.9 C), Min:97.6 F (36.4 C), Max:99.1 F (37.3 C)  GENERAL: The patient is AO x3, in no acute distress. HEENT: Head is normocephalic and atraumatic. EOMI are intact. Mouth is well hydrated and without lesions. NECK: Supple. No masses LUNGS: Clear to auscultation. No presence of rhonchi/wheezing/rales. Adequate chest expansion HEART: RRR, normal s1 and s2. ABDOMEN: tender upon palpation of the epigastric area, no guarding, no peritoneal signs. Has some distention in upper abdomen. BS +. No masses. EXTREMITIES: Without any cyanosis, clubbing, rash, lesions or edema. No asterixis. NEUROLOGIC: AOx3, no focal motor deficit. SKIN: no jaundice, no rashes  Laboratory Data: CBC:     Component Value Date/Time   WBC 1.9 (L) 01/01/2024 0504   RBC 2.61 (L) 01/01/2024 0504   HGB 8.1 (L) 01/01/2024 0504   HCT 24.2 (L) 01/01/2024 0504   PLT 26 (LL) 01/01/2024 0504   MCV 92.7 01/01/2024 0504   MCH 31.0 01/01/2024 0504   MCHC 33.5 01/01/2024 0504  RDW 17.7 (H) 01/01/2024 0504   LYMPHSABS 0.5 (L) 11/16/2023 0151   MONOABS 0.5 11/16/2023 0151   EOSABS 0.0 11/16/2023 0151   BASOSABS 0.0 11/16/2023 0151   COAG:  Lab Results  Component Value Date   INR 1.6 (H) 01/01/2024   INR 1.5 (H) 12/31/2023   INR 1.3 (H) 12/29/2023    BMP:     Latest Ref Rng & Units 01/01/2024    5:04 AM 12/31/2023    2:32 AM 12/29/2023   11:58 PM  BMP  Glucose 70 - 99 mg/dL 84  86   885   BUN 6 - 20 mg/dL 9  9  12    Creatinine 0.61 - 1.24 mg/dL 9.28  9.32  9.23   Sodium 135 - 145 mmol/L 133  134  137   Potassium 3.5 - 5.1 mmol/L 3.5  3.3  3.6   Chloride 98 - 111 mmol/L 97  98  103   CO2 22 - 32 mmol/L 27  29  24    Calcium  8.9 - 10.3 mg/dL 7.9  7.6  8.1     HEPATIC:     Latest Ref Rng & Units 01/01/2024    5:04 AM 12/31/2023    7:29 AM 12/29/2023   11:58 PM  Hepatic Function  Total Protein 6.5 - 8.1 g/dL 6.4  6.0  6.9   Albumin  3.5 - 5.0 g/dL 3.6  2.9  3.4   AST 15 - 41 U/L 42  61  86   ALT 0 - 44 U/L 22  29  38   Alk Phosphatase 38 - 126 U/L 84  90  163   Total Bilirubin 0.0 - 1.2 mg/dL 2.5  3.0  1.7   Bilirubin, Direct 0.0 - 0.2 mg/dL  1.6  1.0     CARDIAC:  Lab Results  Component Value Date   TROPONINI <0.03 03/02/2018      Imaging: I personally reviewed and interpreted the available labs, imaging and endoscopic files.   Assessment/Plan: SHAHIEM BEDWELL is a  43 y.o. 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  Gastroenterology was consulted for evaluation of anemia.   Patient had progressive drop in hemoglobin today along with increasing other cell lines.  He has not presented any overt gastrointestinal bleeding symptoms.  Iron  stores during this admission showed borderline saturation but otherwise rest of iron  stores are normal.  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  Reassuringly, he has not had any overt gastrointestinal bleeding and this does not appears to be related to  esophageal varices.  Given degree of thrombocytopenia, it would not be advisable to proceed with any endoscopic evaluation at the moment but rather with serial monitoring of his hemoglobin and he would benefit from IV iron  supplementation during the current admission.  If pancytopenia persists, may consider consulting hematology.  Reassuringly, after transfusion his hemoglobin has remained stable without any clinical overt bleeding.   In terms of his decompensated liver disease, he was admitted with recurrent pleural effusion/hepatic hydrothorax.  Chest x-ray today x 2 showed presence of recurrent effusion, although it is not clear if  this was taking when he was supine instead of with his body raised up.  We will repeat chest x-ray today.  Hopefully, with uptitration of his diuretics, the hepatic hydrothorax will improve.  Will need to uptitrate as tolerated, keeping a close eye on his renal function and electrolytes.  If he remains refractory to these with persistent or worsening effusion, may need to consider repeat thoracentesis and evaluation by pulmonology/interventional radiology for TIPS at higher level of care [facility]).   He is currently following with liver transplant hematology at [facility] and is already undergoing discussion for possible TIPS, which he can potentially undergo if hepatic hydrothorax recurs as outpatient.  I emphasized the importance of complete alcohol and marijuana cessation which he understands.  MELD 3.0 - 17   - Daily CBC and MELD labs - Furosemide  40 mg BID - Spironolactone  150 mg daily - Daily CBC, keep Hb >7.0 - Active type and screen - Albumin  50 g every 8 hours -Repeat chest x-ray, 2 view standing - Pantoprazole  40 mg qday - If persistent pancytopenia consider hematology consult - evaluate possible splenic embolization -ay need to consider repeat thoracentesis and evaluation by pulmonology/interventional radiology for TIPS at higher level of care if hydrothorax is  refractory to diuretics - Close outpatient follow up with [facility] clinic and discussion regarding TIPS - Low salt diet - Complete and marijuana alcohol cessation  Toribio Fortune, MD Gastroenterology and Hepatology [facility] Gastroenterology

## 2024-01-02 ENCOUNTER — Inpatient Hospital Stay (HOSPITAL_COMMUNITY)

## 2024-01-02 ENCOUNTER — Encounter (HOSPITAL_COMMUNITY): Payer: Self-pay | Admitting: Internal Medicine

## 2024-01-02 LAB — CBC
HCT: 26.4 % — ABNORMAL LOW (ref 39.0–52.0)
Hemoglobin: 8.6 g/dL — ABNORMAL LOW (ref 13.0–17.0)
MCH: 31 pg (ref 26.0–34.0)
MCHC: 32.6 g/dL (ref 30.0–36.0)
MCV: 95.3 fL (ref 80.0–100.0)
Platelets: 27 K/uL — CL (ref 150–400)
RBC: 2.77 MIL/uL — ABNORMAL LOW (ref 4.22–5.81)
RDW: 17.8 % — ABNORMAL HIGH (ref 11.5–15.5)
WBC: 2.3 K/uL — ABNORMAL LOW (ref 4.0–10.5)
nRBC: 0 % (ref 0.0–0.2)

## 2024-01-02 LAB — PROTIME-INR
INR: 1.7 — ABNORMAL HIGH (ref 0.8–1.2)
Prothrombin Time: 21 s — ABNORMAL HIGH (ref 11.4–15.2)

## 2024-01-02 LAB — MAGNESIUM: Magnesium: 1.9 mg/dL (ref 1.7–2.4)

## 2024-01-02 LAB — COMPREHENSIVE METABOLIC PANEL WITH GFR
ALT: 17 U/L (ref 0–44)
AST: 32 U/L (ref 15–41)
Albumin: 4.4 g/dL (ref 3.5–5.0)
Alkaline Phosphatase: 77 U/L (ref 38–126)
Anion gap: 10 (ref 5–15)
BUN: 10 mg/dL (ref 6–20)
CO2: 30 mmol/L (ref 22–32)
Calcium: 8.9 mg/dL (ref 8.9–10.3)
Chloride: 100 mmol/L (ref 98–111)
Creatinine, Ser: 0.76 mg/dL (ref 0.61–1.24)
GFR, Estimated: >60 mL/min (ref >60)
Glucose, Bld: 86 mg/dL (ref 70–99)
Potassium: 3.7 mmol/L (ref 3.5–5.1)
Sodium: 139 mmol/L (ref 135–145)
Total Bilirubin: 2.5 mg/dL — ABNORMAL HIGH (ref 0.0–1.2)
Total Protein: 6.9 g/dL (ref 6.5–8.1)

## 2024-01-02 LAB — PATHOLOGIST SMEAR REVIEW

## 2024-01-02 MED ORDER — SPIRONOLACTONE 100 MG PO TABS: 150.0000 mg | ORAL_TABLET | Freq: Every day | ORAL | 1 refills | Status: AC

## 2024-01-02 MED ORDER — LIDOCAINE HCL (PF) 2 % IJ SOLN
INTRAMUSCULAR | Status: AC
  Filled 2024-01-02: qty 20

## 2024-01-02 MED ORDER — LIDOCAINE HCL (PF) 2 % IJ SOLN
10.0000 mL | Freq: Once | INTRAMUSCULAR | Status: AC
  Administered 2024-01-02: 10 mL

## 2024-01-02 NOTE — Plan of Care (Signed)
 Problem: Education: Goal: Knowledge of General Education information will improve Description: Including pain rating scale, medication(s)/side effects and non-pharmacologic comfort measures 01/02/2024 1259 by Mathews Norleen POUR, RN Outcome: Adequate for Discharge 01/02/2024 0929 by Mathews Norleen POUR, RN Outcome: Progressing   Problem: Health Behavior/Discharge Planning: Goal: Ability to manage health-related needs will improve 01/02/2024 1259 by Mathews Norleen POUR, RN Outcome: Adequate for Discharge 01/02/2024 0929 by Mathews Norleen POUR, RN Outcome: Progressing   Problem: Clinical Measurements: Goal: Ability to maintain clinical measurements within normal limits will improve 01/02/2024 1259 by Mathews Norleen POUR, RN Outcome: Adequate for Discharge 01/02/2024 0929 by Mathews Norleen POUR, RN Outcome: Progressing Goal: Will remain free from infection 01/02/2024 1259 by Mathews Norleen POUR, RN Outcome: Adequate for Discharge 01/02/2024 0929 by Mathews Norleen POUR, RN Outcome: Progressing Goal: Diagnostic test results will improve 01/02/2024 1259 by Mathews Norleen POUR, RN Outcome: Adequate for Discharge 01/02/2024 0929 by Mathews Norleen POUR, RN Outcome: Progressing Goal: Respiratory complications will improve 01/02/2024 1259 by Mathews Norleen POUR, RN Outcome: Adequate for Discharge 01/02/2024 0929 by Mathews Norleen POUR, RN Outcome: Progressing Goal: Cardiovascular complication will be avoided 01/02/2024 1259 by Mathews Norleen POUR, RN Outcome: Adequate for Discharge 01/02/2024 0929 by Mathews Norleen POUR, RN Outcome: Progressing   Problem: Activity: Goal: Risk for activity intolerance will decrease 01/02/2024 1259 by Mathews Norleen POUR, RN Outcome: Adequate for Discharge 01/02/2024 0929 by Mathews Norleen POUR, RN Outcome: Progressing   Problem: Nutrition: Goal: Adequate nutrition will be maintained 01/02/2024 1259 by Mathews Norleen POUR, RN Outcome: Adequate for Discharge 01/02/2024 0929 by Mathews Norleen POUR, RN Outcome: Progressing   Problem: Coping: Goal: Level of anxiety  will decrease 01/02/2024 1259 by Mathews Norleen POUR, RN Outcome: Adequate for Discharge 01/02/2024 0929 by Mathews Norleen POUR, RN Outcome: Progressing   Problem: Elimination: Goal: Will not experience complications related to bowel motility 01/02/2024 1259 by Mathews Norleen POUR, RN Outcome: Adequate for Discharge 01/02/2024 0929 by Mathews Norleen POUR, RN Outcome: Progressing Goal: Will not experience complications related to urinary retention 01/02/2024 1259 by Mathews Norleen POUR, RN Outcome: Adequate for Discharge 01/02/2024 0929 by Mathews Norleen POUR, RN Outcome: Progressing   Problem: Pain Managment: Goal: General experience of comfort will improve and/or be controlled 01/02/2024 1259 by Mathews Norleen POUR, RN Outcome: Adequate for Discharge 01/02/2024 0929 by Mathews Norleen POUR, RN Outcome: Progressing   Problem: Safety: Goal: Ability to remain free from injury will improve 01/02/2024 1259 by Mathews Norleen POUR, RN Outcome: Adequate for Discharge 01/02/2024 0929 by Mathews Norleen POUR, RN Outcome: Progressing   Problem: Skin Integrity: Goal: Risk for impaired skin integrity will decrease 01/02/2024 1259 by Mathews Norleen POUR, RN Outcome: Adequate for Discharge 01/02/2024 0929 by Mathews Norleen POUR, RN Outcome: Progressing   Problem: Activity: Goal: Ability to tolerate increased activity will improve 01/02/2024 1259 by Mathews Norleen POUR, RN Outcome: Adequate for Discharge 01/02/2024 0929 by Mathews Norleen POUR, RN Outcome: Progressing   Problem: Clinical Measurements: Goal: Ability to maintain a body temperature in the normal range will improve 01/02/2024 1259 by Mathews Norleen POUR, RN Outcome: Adequate for Discharge 01/02/2024 0929 by Mathews Norleen POUR, RN Outcome: Progressing   Problem: Respiratory: Goal: Ability to maintain adequate ventilation will improve 01/02/2024 1259 by Mathews Norleen POUR, RN Outcome: Adequate for Discharge 01/02/2024 0929 by Mathews Norleen POUR, RN Outcome: Progressing Goal: Ability to maintain a clear airway will improve 01/02/2024  1259 by Mathews Norleen POUR, RN Outcome: Adequate for Discharge 01/02/2024 0929 by Mathews Norleen POUR, RN Outcome:  Progressing

## 2024-01-02 NOTE — Progress Notes (Signed)
 Patient tolerated Right Thoracentesis procedure well today and 1.4 Liters of clear yellow fluid removed. Patient verbalized understanding of post procedure instructions and transported via stretcher to xray at this time for post chest xray with no acute distress noted.

## 2024-01-02 NOTE — Procedures (Signed)
 PROCEDURE SUMMARY:  Successful image-guided right thoracentesis. Yielded 1.4 liters of clear yellow fluid.  Patient tolerated procedure well. EBL < 1 mL. No immediate complications.  Specimen was not sent for labs. Post procedure CXR shows no pneumothorax.  Please see imaging section of Epic for full dictation.  Clotilda DELENA Hesselbach PA-C 01/02/2024 11:53 AM

## 2024-01-02 NOTE — Plan of Care (Signed)

## 2024-01-03 ENCOUNTER — Telehealth: Payer: Self-pay

## 2024-01-03 LAB — PREPARE PLATELET PHERESIS: Unit division: 0

## 2024-01-03 LAB — BPAM PLATELET PHERESIS
Blood Product Expiration Date: 202512012359
ISSUE DATE / TIME: 202511301608
Unit Type and Rh: 6200

## 2024-01-03 NOTE — Transitions of Care (Post Inpatient/ED Visit) (Unsigned)
   01/03/2024  Name: Robert Sellers MRN: [id number] DOB: 1980-09-24  Today's TOC FU Call Status: Today's TOC FU Call Status:: Unsuccessful Call (1st Attempt) Unsuccessful Call (1st Attempt) Date: 01/03/24  Attempted to reach the patient regarding the most recent Inpatient/ED visit.  Follow Up Plan: Additional outreach attempts will be made to reach the patient to complete the Transitions of Care (Post Inpatient/ED visit) call.   Signature Julian Lemmings, LPN [facility] Nurse Health Advisor Direct Dial [phone]

## 2024-01-04 LAB — CULTURE, BLOOD (ROUTINE X 2)
Culture: NO GROWTH
Culture: NO GROWTH
Special Requests: ADEQUATE
Special Requests: ADEQUATE

## 2024-01-04 LAB — CULTURE, BODY FLUID W GRAM STAIN -BOTTLE: Culture: NO GROWTH

## 2024-01-04 NOTE — Transitions of Care (Post Inpatient/ED Visit) (Signed)
 01/04/2024  Name: Robert Sellers MRN: [id number] DOB: [birth year]  Today's TOC FU Call Status: Today's TOC FU Call Status:: Successful TOC FU Call Completed Unsuccessful Call (1st Attempt) Date: 01/03/24 [facility] FU Call Complete Date: 01/04/24  Patient's Name and Date of Birth confirmed. Name, DOB  Transition Care Management Follow-up Telephone Call Date of Discharge: 01/02/24 Discharge Facility: [facility] ([facility]) Type of Discharge: Inpatient Admission Primary Inpatient Discharge Diagnosis:: pleural effusion How have you been since you were released from the hospital?: Better Any questions or concerns?: No  Items Reviewed: Did you receive and understand the discharge instructions provided?: Yes Medications obtained,verified, and reconciled?: Yes (Medications Reviewed) Any new allergies since your discharge?: No Dietary orders reviewed?: Yes Do you have support at home?: Yes People in Home [RPT]: other relative(s)  Medications Reviewed Today: Medications Reviewed Today     Reviewed by Emmitt Pan, LPN (Licensed Practical Nurse) on 01/04/24 at 1044  Med List Status: <None>   Medication Order Taking? Sig Documenting Provider Last Dose Status Informant  carvedilol  (COREG ) 3.125 MG tablet [id number] Yes Take 3.125 mg by mouth 2 (two) times daily with a meal. [provider]  Active Self           Med Note JACKOLYN WADDELL DEL   Fri Dec 30, 2023 11:26 AM) Last dispense in July for 30 days. Dispense records do not support compliance.   furosemide  (LASIX ) 40 MG tablet [id number] Yes Take 1 tablet (40 mg total) by mouth 2 (two) times daily. Cindy Garnette POUR, MD  Active Self           Med Note JACKOLYN WADDELL DEL   Fri Dec 30, 2023 11:26 AM) Last dispense record from April. Dispense record does not support compliance.   lactulose  (CHRONULAC ) 10 GM/15ML solution [id number] Yes Take 30 mLs (20 g total) by mouth daily.  Patient taking differently: Take 20 g by mouth 2 (two) times  daily as needed for moderate constipation.   Cindy Garnette POUR, MD  Active Self  Multiple Vitamin (MULTIVITAMIN PO) [id number] Yes Take 1 tablet by mouth daily. [provider]  Active Self  naltrexone  (DEPADE) 50 MG tablet [id number] Yes Take 50 mg by mouth daily. [provider]  Active Self           Med Note JACKOLYN WADDELL DEL   Fri Dec 30, 2023 11:27 AM) Last dispense record from May. Dispense records do not support compliance.  rifaximin  (XIFAXAN ) 550 MG TABS tablet [id number] Yes Take 550 mg by mouth 2 (two) times daily. [provider]  Active Self           Med Note JACKOLYN WADDELL DEL   Fri Dec 30, 2023 11:27 AM) No dispense records.   spironolactone  (ALDACTONE ) 100 MG tablet [id number] Yes Take 1.5 tablets (150 mg total) by mouth daily. Evonnie Alm, MD  Active             Home Care and Equipment/Supplies: Were Home Health Services Ordered?: NA Any new equipment or medical supplies ordered?: NA  Functional Questionnaire: Do you need assistance with bathing/showering or dressing?: No Do you need assistance with meal preparation?: No Do you need assistance with eating?: No Do you have difficulty maintaining continence: No Do you need assistance with getting out of bed/getting out of a chair/moving?: No Do you have difficulty managing or taking your medications?: No  Follow up appointments reviewed: PCP Follow-up appointment confirmed?: No (moved out of the area, will call back if  needs to schedule) MD Provider Line Number:[phone] Given: No Specialist Hospital Follow-up appointment confirmed?: NA Do you need transportation to your follow-up appointment?: No Do you understand care options if your condition(s) worsen?: Yes-patient verbalized understanding    SIGNATURE Julian Lemmings, LPN [facility] Nurse Health Advisor Direct Dial [phone]

## 2024-01-06 LAB — CBC
HCT: 24.2 % — ABNORMAL LOW (ref 39.0–52.0)
Hemoglobin: 8.1 g/dL — ABNORMAL LOW (ref 13.0–17.0)
MCH: 31 pg (ref 26.0–34.0)
MCHC: 33.5 g/dL (ref 30.0–36.0)
MCV: 92.7 fL (ref 80.0–100.0)
Platelets: 26 K/uL — CL (ref 150–400)
RBC: 2.61 MIL/uL — ABNORMAL LOW (ref 4.22–5.81)
RDW: 17.7 % — ABNORMAL HIGH (ref 11.5–15.5)
WBC: 1.9 K/uL — ABNORMAL LOW (ref 4.0–10.5)
nRBC: 0 % (ref 0.0–0.2)
